# Patient Record
Sex: Female | Born: 1975 | Race: White | Hispanic: No | Marital: Married | Smoking: Current every day smoker
Health system: Southern US, Community
[De-identification: ages and names within clinical notes are randomized; demographics above are authoritative.]

## PROBLEM LIST (undated history)

## (undated) DIAGNOSIS — G709 Myoneural disorder, unspecified: Secondary | ICD-10-CM

## (undated) DIAGNOSIS — D649 Anemia, unspecified: Secondary | ICD-10-CM

## (undated) DIAGNOSIS — M1990 Unspecified osteoarthritis, unspecified site: Secondary | ICD-10-CM

## (undated) DIAGNOSIS — R059 Cough, unspecified: Secondary | ICD-10-CM

## (undated) DIAGNOSIS — G2581 Restless legs syndrome: Secondary | ICD-10-CM

## (undated) DIAGNOSIS — R05 Cough: Secondary | ICD-10-CM

## (undated) DIAGNOSIS — K219 Gastro-esophageal reflux disease without esophagitis: Secondary | ICD-10-CM

## (undated) DIAGNOSIS — R7303 Prediabetes: Secondary | ICD-10-CM

## (undated) DIAGNOSIS — T4145XA Adverse effect of unspecified anesthetic, initial encounter: Secondary | ICD-10-CM

## (undated) DIAGNOSIS — J45909 Unspecified asthma, uncomplicated: Secondary | ICD-10-CM

## (undated) DIAGNOSIS — J40 Bronchitis, not specified as acute or chronic: Secondary | ICD-10-CM

## (undated) DIAGNOSIS — G43909 Migraine, unspecified, not intractable, without status migrainosus: Secondary | ICD-10-CM

## (undated) DIAGNOSIS — F419 Anxiety disorder, unspecified: Secondary | ICD-10-CM

## (undated) DIAGNOSIS — T753XXA Motion sickness, initial encounter: Secondary | ICD-10-CM

## (undated) HISTORY — PX: MYELOGRAM: SHX5347

## (undated) HISTORY — DX: Migraine, unspecified, not intractable, without status migrainosus: G43.909

## (undated) HISTORY — DX: Gastro-esophageal reflux disease without esophagitis: K21.9

## (undated) HISTORY — PX: CARPAL TUNNEL RELEASE: SHX101

## (undated) HISTORY — DX: Unspecified asthma, uncomplicated: J45.909

## (undated) HISTORY — PX: ABDOMINAL HYSTERECTOMY: SHX81

## (undated) HISTORY — PX: TONSILLECTOMY: SUR1361

## (undated) HISTORY — PX: ULNAR NERVE TRANSPOSITION: SHX2595

---
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
## 2004-05-24 ENCOUNTER — Ambulatory Visit: Payer: Self-pay

## 2004-08-09 ENCOUNTER — Emergency Department: Payer: Self-pay | Admitting: Emergency Medicine

## 2004-09-28 ENCOUNTER — Ambulatory Visit: Payer: Self-pay | Admitting: General Surgery

## 2004-10-03 ENCOUNTER — Ambulatory Visit: Payer: Self-pay | Admitting: General Surgery

## 2004-10-13 ENCOUNTER — Ambulatory Visit: Payer: Self-pay | Admitting: General Surgery

## 2004-10-17 ENCOUNTER — Ambulatory Visit: Payer: Self-pay | Admitting: General Surgery

## 2006-05-13 ENCOUNTER — Ambulatory Visit: Payer: Self-pay | Admitting: Internal Medicine

## 2006-05-15 ENCOUNTER — Ambulatory Visit: Payer: Self-pay | Admitting: Internal Medicine

## 2006-05-27 ENCOUNTER — Encounter (INDEPENDENT_AMBULATORY_CARE_PROVIDER_SITE_OTHER): Payer: Self-pay | Admitting: *Deleted

## 2006-05-27 ENCOUNTER — Ambulatory Visit: Payer: Self-pay | Admitting: Internal Medicine

## 2006-06-18 ENCOUNTER — Ambulatory Visit: Payer: Self-pay

## 2006-07-02 ENCOUNTER — Ambulatory Visit: Payer: Self-pay | Admitting: Internal Medicine

## 2006-07-04 ENCOUNTER — Encounter (INDEPENDENT_AMBULATORY_CARE_PROVIDER_SITE_OTHER): Payer: Self-pay | Admitting: Specialist

## 2006-07-04 ENCOUNTER — Ambulatory Visit: Payer: Self-pay | Admitting: Internal Medicine

## 2006-07-10 ENCOUNTER — Inpatient Hospital Stay (HOSPITAL_COMMUNITY): Admission: RE | Admit: 2006-07-10 | Discharge: 2006-07-22 | Payer: Self-pay | Admitting: Neurosurgery

## 2006-07-10 HISTORY — PX: BACK SURGERY: SHX140

## 2006-07-29 ENCOUNTER — Encounter: Payer: Self-pay | Admitting: Vascular Surgery

## 2006-07-29 ENCOUNTER — Ambulatory Visit (HOSPITAL_COMMUNITY): Admission: RE | Admit: 2006-07-29 | Discharge: 2006-07-29 | Payer: Self-pay | Admitting: Neurosurgery

## 2006-08-16 ENCOUNTER — Ambulatory Visit: Payer: Self-pay | Admitting: Internal Medicine

## 2007-01-03 ENCOUNTER — Ambulatory Visit (HOSPITAL_COMMUNITY): Admission: RE | Admit: 2007-01-03 | Discharge: 2007-01-03 | Payer: Self-pay | Admitting: Neurosurgery

## 2007-02-28 ENCOUNTER — Ambulatory Visit: Payer: Self-pay | Admitting: Obstetrics and Gynecology

## 2007-08-18 ENCOUNTER — Inpatient Hospital Stay: Payer: Self-pay | Admitting: Internal Medicine

## 2007-09-30 ENCOUNTER — Inpatient Hospital Stay: Payer: Self-pay | Admitting: Internal Medicine

## 2008-02-05 ENCOUNTER — Ambulatory Visit (HOSPITAL_COMMUNITY): Admission: RE | Admit: 2008-02-05 | Discharge: 2008-02-05 | Payer: Self-pay | Admitting: Neurosurgery

## 2008-02-11 ENCOUNTER — Ambulatory Visit (HOSPITAL_COMMUNITY): Admission: RE | Admit: 2008-02-11 | Discharge: 2008-02-11 | Payer: Self-pay | Admitting: Neurosurgery

## 2008-06-07 ENCOUNTER — Ambulatory Visit (HOSPITAL_COMMUNITY): Admission: RE | Admit: 2008-06-07 | Discharge: 2008-06-07 | Payer: Self-pay | Admitting: Neurosurgery

## 2008-07-11 ENCOUNTER — Emergency Department: Payer: Self-pay | Admitting: Emergency Medicine

## 2008-07-21 ENCOUNTER — Emergency Department: Payer: Self-pay

## 2008-07-30 HISTORY — PX: BACK SURGERY: SHX140

## 2008-09-29 ENCOUNTER — Emergency Department: Payer: Self-pay | Admitting: Unknown Physician Specialty

## 2008-11-16 ENCOUNTER — Emergency Department: Payer: Self-pay | Admitting: Emergency Medicine

## 2008-12-02 ENCOUNTER — Ambulatory Visit (HOSPITAL_COMMUNITY): Admission: RE | Admit: 2008-12-02 | Discharge: 2008-12-03 | Payer: Self-pay | Admitting: Neurosurgery

## 2008-12-29 ENCOUNTER — Ambulatory Visit (HOSPITAL_COMMUNITY): Admission: RE | Admit: 2008-12-29 | Discharge: 2008-12-31 | Payer: Self-pay | Admitting: Neurosurgery

## 2009-04-06 ENCOUNTER — Ambulatory Visit (HOSPITAL_COMMUNITY): Admission: RE | Admit: 2009-04-06 | Discharge: 2009-04-07 | Payer: Self-pay | Admitting: Neurosurgery

## 2009-05-25 ENCOUNTER — Encounter: Admission: RE | Admit: 2009-05-25 | Discharge: 2009-06-29 | Payer: Self-pay | Admitting: Neurosurgery

## 2009-08-01 ENCOUNTER — Telehealth: Payer: Self-pay | Admitting: Internal Medicine

## 2009-08-02 DIAGNOSIS — M1990 Unspecified osteoarthritis, unspecified site: Secondary | ICD-10-CM | POA: Insufficient documentation

## 2009-08-02 DIAGNOSIS — R519 Headache, unspecified: Secondary | ICD-10-CM | POA: Insufficient documentation

## 2009-08-02 DIAGNOSIS — F410 Panic disorder [episodic paroxysmal anxiety] without agoraphobia: Secondary | ICD-10-CM

## 2009-08-02 DIAGNOSIS — Z8719 Personal history of other diseases of the digestive system: Secondary | ICD-10-CM

## 2009-08-02 DIAGNOSIS — K449 Diaphragmatic hernia without obstruction or gangrene: Secondary | ICD-10-CM

## 2009-08-02 DIAGNOSIS — R51 Headache: Secondary | ICD-10-CM

## 2009-08-02 DIAGNOSIS — F411 Generalized anxiety disorder: Secondary | ICD-10-CM | POA: Insufficient documentation

## 2009-08-02 DIAGNOSIS — K219 Gastro-esophageal reflux disease without esophagitis: Secondary | ICD-10-CM | POA: Insufficient documentation

## 2009-08-03 ENCOUNTER — Telehealth: Payer: Self-pay | Admitting: Internal Medicine

## 2009-08-04 ENCOUNTER — Emergency Department: Payer: Self-pay | Admitting: Internal Medicine

## 2009-08-12 ENCOUNTER — Ambulatory Visit: Payer: Self-pay | Admitting: Unknown Physician Specialty

## 2009-08-22 ENCOUNTER — Telehealth: Payer: Self-pay | Admitting: Internal Medicine

## 2009-09-05 ENCOUNTER — Ambulatory Visit: Payer: Self-pay | Admitting: Internal Medicine

## 2009-09-05 DIAGNOSIS — K589 Irritable bowel syndrome without diarrhea: Secondary | ICD-10-CM

## 2009-09-06 ENCOUNTER — Ambulatory Visit: Payer: Self-pay | Admitting: Internal Medicine

## 2009-09-08 ENCOUNTER — Encounter: Payer: Self-pay | Admitting: Internal Medicine

## 2009-09-15 ENCOUNTER — Ambulatory Visit (HOSPITAL_COMMUNITY): Admission: RE | Admit: 2009-09-15 | Discharge: 2009-09-15 | Payer: Self-pay | Admitting: Internal Medicine

## 2009-09-16 ENCOUNTER — Encounter: Payer: Self-pay | Admitting: Internal Medicine

## 2009-09-16 ENCOUNTER — Telehealth: Payer: Self-pay | Admitting: Internal Medicine

## 2009-10-03 ENCOUNTER — Ambulatory Visit: Payer: Self-pay | Admitting: Internal Medicine

## 2009-10-03 LAB — CONVERTED CEMR LAB
ALT: 17 units/L (ref 0–35)
AST: 19 units/L (ref 0–37)
Albumin: 3.9 g/dL (ref 3.5–5.2)
Alkaline Phosphatase: 77 units/L (ref 39–117)
Bilirubin, Direct: 0.1 mg/dL (ref 0.0–0.3)
Ferritin: 62.9 ng/mL (ref 10.0–291.0)
Total Bilirubin: 0.3 mg/dL (ref 0.3–1.2)

## 2010-04-12 ENCOUNTER — Telehealth: Payer: Self-pay | Admitting: Internal Medicine

## 2010-07-10 ENCOUNTER — Ambulatory Visit (HOSPITAL_COMMUNITY)
Admission: RE | Admit: 2010-07-10 | Discharge: 2010-07-10 | Payer: Self-pay | Source: Home / Self Care | Attending: Neurosurgery | Admitting: Neurosurgery

## 2010-08-20 ENCOUNTER — Encounter: Payer: Self-pay | Admitting: Neurosurgery

## 2010-08-21 ENCOUNTER — Encounter: Payer: Self-pay | Admitting: Neurosurgery

## 2010-08-29 NOTE — Procedures (Signed)
Summary: Upper Endoscopy  Patient: [name] Note: All result statuses are Final unless otherwise noted.  Tests: (1) Upper Endoscopy (EGD)   EGD Upper Endoscopy       DONE     [facility]     [address]     [place], [place]  [address]           ENDOSCOPY PROCEDURE REPORT           PATIENT:  [name], [name]  MR#:  [id number]     BIRTHDATE:  1976/06/28, 33 yrs. old  GENDER:  female           ENDOSCOPIST:  Hedwig Morton. Juanda Chance, MD     Referred by:  Loma Sender, M.D.           PROCEDURE DATE:  09/06/2009     PROCEDURE:  EGD with biopsy     ASA CLASS:  Class I     INDICATIONS:  GERD, heartburn long standing GERD, exacerbation     after tonsillectomy refractory to PPI     last EGD 04/2006           MEDICATIONS:   Versed 10 mg, Fentanyl 75 mcg, Benadryl 25 mg     TOPICAL ANESTHETIC:  Exactacain Spray           DESCRIPTION OF PROCEDURE:   After the risks benefits and     alternatives of the procedure were thoroughly explained, informed     consent was obtained.  The [facility] [id number] [id number] endoscope was     introduced through the mouth and advanced to the second portion of     the duodenum, without limitations.  The instrument was slowly     withdrawn as the mucosa was fully examined.     <<PROCEDUREIMAGES>>           Esophagitis was found. mild erythema With standard forceps, a     biopsy was obtained and sent to pathology (see image2).  irregular     Z-line (see image1 and image3).  Otherwise the examination was     normal (see image6, image5, and image4).    Retroflexed views     revealed no abnormalities.    The scope was then withdrawn from     the patient and the procedure completed.           COMPLICATIONS:  None           ENDOSCOPIC IMPRESSION:     1) Esophagitis     2) Irregular Z-line     3) Otherwise normal examination     RECOMMENDATIONS:     1) anti-reflux regimen to be follow     2) await biopsy results     Nexiem 40 mg po bid x 3 days then 40 mg  po qd     consider adding Regllan 10 mg qhs if Sx's don't improve           REPEAT EXAM:  In 0 year(s) for.           ______________________________     Hedwig Morton. Juanda Chance, MD           CC:           n.     eSIGNED:   Hedwig Morton. Afsana Liera at 09/06/2009 04:29 PM           Joni Fears, [id number]  Note: An exclamation mark (!) indicates a result that was not dispersed into the flowsheet.  Document Creation Date: 09/07/2009 10:18 AM _______________________________________________________________________  (1) Order result status: Final Collection or observation date-time: 09/06/2009 16:19 Requested date-time:  Receipt date-time:  Reported date-time:  Referring Physician:   Ordering Physician: Lina Sar [phone]) Specimen Source:  Source: Launa Grill Order Number: [id number] Lab site:

## 2010-08-29 NOTE — Assessment & Plan Note (Signed)
Summary: WORSENING GERD                  DEBORAH   History of Present Illness Visit Type: Follow-up Visit Primary GI MD: Lina Sar MD Primary Provider: Loma Sender, MD  Requesting Provider: n/a Chief Complaint: Worsening GERD  History of Present Illness:   This is a 35 year old white female with chronic gastroesophageal reflux disease  which has  flared up after a tonsillectomy several  weeks ago. She was without her medications for several days and her reflux has become much worse. She increased her Zegerid to bid  but has not noticed significant improvement. Her last upper endoscopy in October 2007 showed grade 2 esophagitis but no evidence of Barrett's esophagus. A colonoscopy in December 2007 was normal. Biopsies for microscopic colitis were negative. She was on Reglan 10 mg at bedtime. Recently, she has had hoarseness and food regurgitation as well as pill dysphagia. Her weight has remained excessive although she has been able to lose 17 pounds since her tonsillectomy. Other medical history includes endometriosis for which she is status post TAH/BSO. She also has had placement of a spinal plate. An upper abdominal ultrasound in October 2007 was negative. She stopped smoking in December 2010 and has eliminated soft drinks. There is a family history of gallbladder disease in her mother, maternal grandmother and her father.   GI Review of Systems    Reports acid reflux and  heartburn.      Denies abdominal pain, belching, bloating, chest pain, dysphagia with liquids, dysphagia with solids, loss of appetite, nausea, vomiting, vomiting blood, weight loss, and  weight gain.        Denies anal fissure, black tarry stools, change in bowel habit, constipation, diarrhea, diverticulosis, fecal incontinence, heme positive stool, hemorrhoids, irritable bowel syndrome, jaundice, light color stool, liver problems, rectal bleeding, and  rectal pain.    Current Medications (verified): 1)  Percocet  10-325 Mg Tabs (Oxycodone-Acetaminophen) .... As Needed For Pain 2)  Flexeril 10 Mg Tabs (Cyclobenzaprine Hcl) .... One Tablet By Mouth Three Times A Day 3)  Prozac 20 Mg Caps (Fluoxetine Hcl) .... One Capsule By Mouth Once Daily 4)  Zegerid Otc 20-1100 Mg Caps (Omeprazole-Sodium Bicarbonate) .... One Tablet By Mouth Two Times A Day  Allergies (verified): 1)  ! Pcn  Past History:  Past Medical History: Reviewed history from 08/02/2009 and no changes required. Current Problems:  HIATAL HERNIA (ICD-553.3) REFLUX ESOPHAGITIS, HX OF (ICD-V12.79) PANIC DISORDER (ICD-300.01) ANXIETY (ICD-300.00) HEADACHE, CHRONIC (ICD-784.0) DEGENERATIVE JOINT DISEASE (ICD-715.90) GERD (ICD-530.81) IBS (ICD-564.1)    Past Surgical History: Hysterectomy Multiple surgeries for endometriosis Back surgery x 2  Tonsillectomy  Family History: Reviewed history from 08/02/2009 and no changes required. No FH of Colon Cancer: Family History of Diabetes: Father, Grandmother, Great grandmother Family History of Heart Disease: Father  Social History: Occupation: Disabled Married Alcohol Use - no Patient is a former smoker: Quit 06-2009 Daily Caffeine Use: occ  Illicit Drug Use - no Smoking Status:  quit  Review of Systems  The patient denies allergy/sinus, anemia, anxiety-new, arthritis/joint pain, back pain, blood in urine, breast changes/lumps, change in vision, confusion, cough, coughing up blood, depression-new, fainting, fatigue, fever, headaches-new, hearing problems, heart murmur, heart rhythm changes, itching, menstrual pain, muscle pains/cramps, night sweats, nosebleeds, pregnancy symptoms, shortness of breath, skin rash, sleeping problems, sore throat, swelling of feet/legs, swollen lymph glands, thirst - excessive , urination - excessive , urination changes/pain, urine leakage, vision changes, and voice  change.         Pertinent positive and negative review of systems were noted in the above  HPI. All other ROS was otherwise negative.   Vital Signs:  Patient profile:   35 year old female Height:      67 inches Weight:      229 pounds BMI:     36.00 BSA:     2.14 Pulse rate:   68 / minute Pulse rhythm:   regular BP sitting:   126 / 80  (left arm) Cuff size:   regular  Vitals Entered By: Ok Anis CMA (September 05, 2009 11:06 AM)  Physical Exam  General:  Well developed, well nourished, no acute distress. Eyes:  PERRLA, no icterus. Mouth:  No deformity or lesions, dentition normal. Neck:  Supple; no masses or thyromegaly. Lungs:  Clear throughout to auscultation. Heart:  Regular rate and rhythm; no murmurs, rubs,  or bruits. Abdomen:  soft abdomen, mildly tender along the left costal margin and left middle quadrant. Bowel sounds are normoactive. There is no distention. There is a post appendectomy scar in the right lower quadrant. Extremities:  No clubbing, cyanosis, edema or deformities noted. Skin:  Intact without significant lesions or rashes. Psych:  Alert and cooperative. Normal mood and affect.   Impression & Recommendations:  Problem # 1:  HIATAL HERNIA (ICD-553.3)  Patient has had an exacerbation of gastroesophageal reflux after tonsillectomy. We will start her on Nexium 40 mg twice a day with samples and we will also send her a prescription. She will continue antireflux measures. We will proceed with an upper endoscopy to rule out Barrett's esophagus.  Orders: EGD (EGD)  Problem # 2:  IBS (ICD-564.1) Patient has IBS predominant diarrhea under reasonable control. Her symptoms have improved since she has lost 17 pounds.  Patient Instructions: 1)  upper endoscopy with biopsies. 2)  Upper abdominal ultrasound. 3)  Nexium 40 mg p.o. b.i.d. for several days then q.d. 4)  Antireflux measures. 5)  Consider restarting Reglan 10 mg at bedtime. 6)  Copy sent to : Dr C. Phillips 7)  The medication list was reviewed and reconciled.  All changed / newly  prescribed medications were explained.  A complete medication list was provided to the patient / caregiver. Prescriptions: NEXIUM 40 MG CPDR (ESOMEPRAZOLE MAGNESIUM) Take 1 tablet by mouth once a day  #30 x 3   Entered by:   Hortense Ramal CMA (AAMA)   Authorized by:   Hart Carwin MD   Signed by:   Hortense Ramal CMA (AAMA) on 09/05/2009   Method used:   Electronically to        [facility] (retail)       [address]       [place], [place]  [address]       Ph: [phone]       Fax: [phone]   RxID:   [id number]   Appended Document: Orders Update    Clinical Lists Changes  Orders: Added new Test order of Ultrasound Abdomen (UAS) - Signed

## 2010-08-29 NOTE — Progress Notes (Signed)
Summary: TRIAGE  Phone Note Outgoing Call   Call placed by: Laureen Ochs LPN,  September 16, 2009 10:56 AM Call placed to: Patient Summary of Call: Ultrasound results reviewed w/pt.  She states the Nexium has improved her symptoms, but not completly. Per Endoscopy report on 09-16-09 if symptoms don't improve add Reglan 10mg  at bedtime. Pt. will continue Nexium and begin Reglan. Pt. to keep scheduled office visit 10-03-09 at 9am. Pt. instructed to call back as needed.  Initial call taken by: Laureen Ochs LPN,  September 16, 2009 10:59 AM    Additional Follow-up for Phone Call Additional follow up Details #2::    I agree with adding Reglan 10 mg at hs Follow-up by: Hart Carwin MD,  September 16, 2009 8:21 PM  New/Updated Medications: METOCLOPRAMIDE HCL 10 MG TABS (METOCLOPRAMIDE HCL) Take 1 every night at bedtime. Prescriptions: METOCLOPRAMIDE HCL 10 MG TABS (METOCLOPRAMIDE HCL) Take 1 every night at bedtime.  #30 x 2   Entered by:   Laureen Ochs LPN   Authorized by:   Hart Carwin MD   Signed by:   Laureen Ochs LPN on 84/13/2440   Method used:   Electronically to        [facility] (retail)       [address]       [place], [place]  [address]       Ph: [phone]       Fax: [phone]   RxID:   [id number]

## 2010-08-29 NOTE — Letter (Signed)
Summary: Appt Reminder 2  [facility]  [address] [place], [place] [address]   Phone: [phone]  Fax: [phone]        September 16, 2009 MRN: [id number]    [facility] [address] [place], [place]  [address]    Dear Ms. Kassin,   You have a return appointment with Dr.Mccoy Testa Juanda Chance on 10-03-09 at 9am. Please remember to bring a complete list of the medicines you are taking, your insurance card and your co-pay.  If you have to cancel or reschedule this appointment, please call before 5:00 pm the evening before to avoid a cancellation fee.  If you have any questions or concerns, please call [phone].    Sincerely,    Laureen Ochs LPN  Appended Document: Appt Reminder 2 Letter mailed to patient.

## 2010-08-29 NOTE — Procedures (Signed)
Summary: EGD   EGD  Procedure date:  05/27/2006  Findings:      Location: [facility]   Patient Name: [name], [name] MRN:  Procedure Procedures: Panendoscopy (EGD) CPT: 43235.    with biopsy(s)/brushing(s). CPT: D1846139.  Personnel: Endoscopist: Jaquala Fuller L. Juanda Chance, MD.  Referred By: Loma Sender, MD.  Exam Location: Exam performed in Outpatient Clinic. Outpatient  Patient Consent: Procedure, Alternatives, Risks and Benefits discussed, consent obtained, from patient. Consent was obtained by the RN.  Indications Symptoms: Abdominal pain, location: epigastric. Reflux symptoms for <1 yr,  History  Current Medications: Patient is not currently taking Coumadin.  Pre-Exam Physical: Performed May 27, 2006  Cardio-pulmonary exam, HEENT exam WNL. Abdominal exam abnormal. Extremity exam, Neurological exam, Mental status exam WNL. Abnormal PE findings include: epig. tenderness.  Comments: Pt. history reviewed/updated, physical exam performed prior to initiation of sedation?yes Exam Exam Info: Maximum depth of insertion Duodenum, intended Duodenum. Vocal cords visualized. Gastric retroflexion performed. Images taken. ASA Classification: I. Tolerance: good.  Sedation Meds: Patient assessed and found to be appropriate for moderate (conscious) sedation. Fentanyl 75 mcg. given IV. Versed 8 mg. given IV. Cetacaine Spray 2 sprays given aerosolized.  Monitoring: BP and pulse monitoring done. Oximetry used. Supplemental O2 given  Findings - ESOPHAGEAL INFLAMMATION: suspected as a result of reflux. Length of inflammation: 1 cm. [place] Classification: Grade B. Biopsy/Esoph Inflamtn taken. ICD9: Esophagitis, Reflux: 530.11. Comments: Grade 2 reflux esophagitis, at least 2 linear erosions, r/o Barrett's  esophagus.  DIAGNOSTIC TEST: from Antrum. RUT done, results pending  Normal: Duodenal Apex. Biopsy/Normal taken. Comments: r/o villous atrophy , pt with diarrhea.     Assessment Abnormal examination, see findings above.  Diagnoses: 530.11: Esophagitis, Reflux.   Comments: reflux esophagitis, s/o biopsies Events  Unplanned Intervention: No unplanned interventions were required.  Unplanned Events: There were no complications. Plans Medication(s): Await pathology. PPI: Pantoprazole/Protonix 40 mg BID, starting May 27, 2006  Promotility: Metaclopramide 10 mg HS, starting May 27, 2006   Patient Education: Patient given standard instructions for: Reflux.  Comments: cosider adding Carafate or antacids such as Gaviscon to her regimen, she needs to loose weight,, stop smoking Disposition: After procedure patient sent to recovery. After recovery patient sent home.   This report was created from the original endoscopy report, which was reviewed and signed by the above listed endoscopist.   FINAL DIAGNOSIS    ***MICROSCOPIC EXAMINATION AND DIAGNOSIS***    1. SMALL BOWEL BIOPSY: BENIGN SMALL BOWEL MUCOSA. NO VILLOUS   ATROPHY, INFLAMMATION OR OTHER ABNORMALITIES PRESENT (biopsies,   duodenum).    2. esophagogastric junction: gastroesophageal junction mucosa   associated with mild acute and chronic inflammation. No   intestinal metaplasia or evidence of malignancy identified   (biopsy).    COMMENT   1. There is small bowel mucosa with normal villous architecture   and no objective increase in inflammation. No villous atrophy,   active inflammation or other significant changes identified.    2. A special stain (PAS-F) is performed to determine the presence   or absence of fungal hyphae in this biopsy. The PAS stain is   negative for fungal hyphae, and the controls stained   appropriately. An Alcian Blue stain is performed to determine the   presence of intestinal metaplasia (goblet cell metaplasia). No   intestinal metaplasia (goblet cell metaplasia) is identified with   the Alcian Blue stain. The control stained appropriately. There    is some staining of some benign mucosal glands but  they do not   have the goblet cell morphology and are not occurring in a   specialized mucosa. There is gastroesophageal junction mucosa   associated with mild to focally moderate acute and chronic   inflammation associated with benign reactive changes of the   epithelium. No atypia or evidence of malignancy is seen. These   findings can be seen in gastroesophageal reflux disease in the   appropriate clinical setting and, therefore, clinical correlation   is recommended. (JAS:jy) 05/28/06    jy   Date Reported: 05/28/2006 Berneta Levins, MD   *** Electronically Signed Out By JAS ***    Clinical information   GERD / abd pain R/O esophagitis (jes)    specimen(s) obtained   1: Small intestine / duodenum, biopsy   2: Esophagogastric junction, biopsy    Gross Description   1. Received in formalin are tan, soft tissue fragments that are   submitted in toto. Number: two.   Size: 0.5 to 0.7 cm One block    2. Received in formalin are tan, soft tissue fragments that are   submitted in toto. Number: two.   Size: 0.3 to 0.4 cm One block (ML:jes, 05/28/06)    jes/

## 2010-08-29 NOTE — Procedures (Signed)
Summary: COLON   Colonoscopy  Procedure date:  07/04/2006  Findings:      Location:  [facility].   Patient Name: Tamara, Welch MRN:  Procedure Procedures: Colonoscopy CPT: [phone].    with biopsy. CPT: Q5068410.  Personnel: Endoscopist: Reisa Coppola L. Juanda Chance, MD.  Exam Location: Exam performed in Outpatient Clinic. Outpatient  Patient Consent: Procedure, Alternatives, Risks and Benefits discussed, consent obtained, from patient. Consent was obtained by the RN.  Indications Symptoms: Diarrhea Patient is having increased frequency of stools. Patient's stools are liquid. Patient is having soft stools.  History  Current Medications: Patient is not currently taking Coumadin.  Pre-Exam Physical: Cardio-pulmonary exam, HEENT exam  WNL. Abdominal exam abnormal. Extremity exam, Neurological exam, Mental status exam WNL. Abnormal PE findings include: epig. tenderness.  Comments: Pt. history reviewed/updated, physical exam performed prior to initiation of sedation?yes Exam Exam: Extent of exam reached: Cecum, extent intended: Cecum.  The cecum was identified by appendiceal orifice and IC valve. Colon retroflexion performed. Images taken. ASA Classification: I. Tolerance: good.  Monitoring: Pulse and BP monitoring, Oximetry used. Supplemental O2 given.  Colon Prep Used Miralax for colon prep. Prep results: good.  Sedation Meds: Patient assessed and found to be appropriate for moderate (conscious) sedation. Fentanyl 75 mcg. given IV. Versed 8 mg. given IV. Benadryl 50 given IV.  Findings - DIAGNOSTIC TEST: Biopsies taken. from Hepatic Flexure to Descending Colon. Reason: r/o microscopic colitis.  - NORMAL EXAM: Cecum.  - NORMAL EXAM: Rectum.    Comments: scope withdrawal time 6:22 min Assessment Normal examination.  Comments: s/p random biopsies Events  Unplanned Interventions: No intervention was required.  Unplanned Events: There were no  complications. Plans Medication Plan: Await pathology. Antispasmodics: Donnatal 1 ac, starting Jul 04, 2006   Comments: pt is schedules to have back surgery nexgt week at [facility] Disposition: After procedure patient sent to recovery. After recovery patient sent home.   This report was created from the original endoscopy report, which was reviewed and signed by the above listed endoscopist.    FINAL DIAGNOSIS    ***MICROSCOPIC EXAMINATION AND DIAGNOSIS***    COLON, RANDOM BIOPSIES: BENIGN COLONIC MUCOSA. NO ACTIVE   INFLAMMATION, MICROSCOPIC COLITIS, GRANULOMAS, OR CHRONIC   CHANGES.    COMMENT   There is colorectal mucosa with normal crypt architecture and no   objective increase in inflammation. No active inflammation,   microscopic colitis, collagenous colitis or significant chronic   changes identified. No hyperplastic or adenomatous changes are   seen, and there is no evidence of malignancy.    gdt   Date Reported: 07/05/2006 Beulah Gandy. Luisa Hart, MD   *** Electronically Signed Out By JDP ***    Clinical information   Diarrhea, early satiety R/O microscopic colitis (jes)    specimen(s) obtained   Colon, biopsy, random    Gross Description   Received in formalin are tan, soft tissue fragments that are   submitted in toto. Number: multiple.   Size: 0.2 to 0.5 cm One block (MA:jes, 07/05/06)

## 2010-08-29 NOTE — Assessment & Plan Note (Signed)
Summary: F/U FROM ENDO. AND ULTRASOUND           [name]   History of Present Illness Visit Type: Follow-up Visit Primary GI MD: Lina Sar MD Primary Provider: Loma Sender, MD  Requesting Provider: n/a Chief Complaint: F/u from endo and ultrasound. Pt states that she still has right side pain  History of Present Illness:   This is a 35 year old white female with severe gastroesophageal reflux documented on prior upper endoscopies; most recently on 09/16/09 which showed grade 2 esophagitis. Biopsies of the Z line showed inflammation. An upper abdominal ultrasound showed fatty liver. There is a positive family history of hemochromatosis in her father and her sister who have both recently had liver biopsies. Patient is asking to be checked for hemochromatosis as well. Patient has been on Nexium 40 mg a day with complete resolution of all her symptoms of reflux. She is post total abdominal hysterectomy and bilateral salpingoophorectomy for endometriosis. Her colonoscopy in October 2007 was normal. She has occasional right lower quadrant abdominal pain and irregular bowel habits.   GI Review of Systems    Reports abdominal pain.     Location of  Abdominal pain: right side.    Denies acid reflux, belching, bloating, chest pain, dysphagia with liquids, dysphagia with solids, heartburn, loss of appetite, nausea, vomiting, vomiting blood, weight loss, and  weight gain.        Denies anal fissure, black tarry stools, change in bowel habit, constipation, diarrhea, diverticulosis, fecal incontinence, heme positive stool, hemorrhoids, irritable bowel syndrome, jaundice, light color stool, liver problems, rectal bleeding, and  rectal pain.    Current Medications (verified): 1)  Percocet 10-325 Mg Tabs (Oxycodone-Acetaminophen) .... As Needed For Pain 2)  Flexeril 10 Mg Tabs (Cyclobenzaprine Hcl) .... One Tablet By Mouth Three Times A Day 3)  Prozac 20 Mg Caps (Fluoxetine Hcl) .... One Capsule By  Mouth Once Daily 4)  Nexium 40 Mg Cpdr (Esomeprazole Magnesium) .... Take 1 Tablet By Mouth Once A Day 5)  Metoclopramide Hcl 10 Mg Tabs (Metoclopramide Hcl) .... Take 1 Every Night At Bedtime.  Allergies (verified): 1)  ! Pcn  Past History:  Past Medical History: Reviewed history from 08/02/2009 and no changes required. Current Problems:  HIATAL HERNIA (ICD-553.3) REFLUX ESOPHAGITIS, HX OF (ICD-V12.79) PANIC DISORDER (ICD-300.01) ANXIETY (ICD-300.00) HEADACHE, CHRONIC (ICD-784.0) DEGENERATIVE JOINT DISEASE (ICD-715.90) GERD (ICD-530.81) IBS (ICD-564.1)    Past Surgical History: Reviewed history from 09/05/2009 and no changes required. Hysterectomy Multiple surgeries for endometriosis Back surgery x 2  Tonsillectomy  Family History: Reviewed history from 08/02/2009 and no changes required. No FH of Colon Cancer: Family History of Diabetes: Father, Grandmother, Great grandmother Family History of Heart Disease: Father  Social History: Reviewed history from 09/05/2009 and no changes required. Occupation: Disabled Married Alcohol Use - no Patient is a former smoker: Quit 06-2009 Daily Caffeine Use: occ  Illicit Drug Use - no  Review of Systems       The patient complains of anxiety-new and back pain.  The patient denies allergy/sinus, anemia, arthritis/joint pain, blood in urine, breast changes/lumps, change in vision, confusion, cough, coughing up blood, depression-new, fainting, fatigue, fever, headaches-new, hearing problems, heart murmur, heart rhythm changes, itching, menstrual pain, muscle pains/cramps, night sweats, nosebleeds, pregnancy symptoms, shortness of breath, skin rash, sleeping problems, sore throat, swelling of feet/legs, swollen lymph glands, thirst - excessive, urination - excessive, urination changes/pain, urine leakage, vision changes, and voice change.         Pertinent positive  and negative review of systems were noted in the above HPI. All other  ROS was otherwise negative.   Vital Signs:  Patient profile:   35 year old female Height:      67 inches Weight:      231 pounds BMI:     36.31 BSA:     2.15 Pulse rate:   62 / minute Pulse rhythm:   regular BP sitting:   128 / 76  (left arm) Cuff size:   regular  Vitals Entered By: Ok Anis CMA (October 03, 2009 9:19 AM)  Physical Exam  General:  Well developed, well nourished, no acute distress. Mouth:  No deformity or lesions, dentition normal. Neck:  Supple; no masses or thyromegaly. Lungs:  Clear throughout to auscultation. Heart:  Regular rate and rhythm; no murmurs, rubs,  or bruits. Abdomen:  obese abdomen, soft with tenderness along the right costal margin and when pounding over the liver. Splenic tip palpable, lower abdomen unremarkable. Extremities:  No clubbing, cyanosis, edema or deformities noted. Skin:  no palmar erythema or spider nevi. Psych:  Alert and cooperative. Normal mood and affect.   Impression & Recommendations:  Problem # 1:  Family Hx of OTHER HEMOCHROMATOSIS (ICD-275.03) There is a history of hemochromatosis in  patient's father and now in one of her sisters. We will do the genetic testing today to confirm whether she has it or not. Liver function tests, iron studies and ferritin will be drawn today as well. Orders: TLB-Hepatic/Liver Function Pnl (80076-HEPATIC) TLB-Ferritin (82728-FER) TLB-IBC Pnl (Iron/FE;Transferrin) (83550-IBC) T-Hemochromatosis [phone])  Problem # 2:  IRRITABLE BOWEL SYNDROME (ICD-564.1) Patient has mild right lower quadrant abdominal pain likely due to to IBS. Her last colonoscopy in 2007 was normal. Orders: TLB-Hepatic/Liver Function Pnl (80076-HEPATIC) TLB-Ferritin (82728-FER) TLB-IBC Pnl (Iron/FE;Transferrin) (83550-IBC) T-Hemochromatosis [phone])  Problem # 3:  REFLUX ESOPHAGITIS, HX OF (ICD-V12.79) currently under good control with Nexium 40 mg daily. We will give her a 90 day  supply.  Patient Instructions: 1)  antireflux measures. 2)  Weight loss. 3)  Nexium 40 mg daily. 4)  Iron studies, genetic HLA  typing for hemochromatosis , ferritin. 5)  Recall colonoscopy October 2017. 6)  The medication list was reviewed and reconciled.  All changed / newly prescribed medications were explained.  A complete medication list was provided to the patient / caregiver. Prescriptions: NEXIUM 40 MG CPDR (ESOMEPRAZOLE MAGNESIUM) Take 1 tablet by mouth once a day  #90 x 3   Entered by:   Hortense Ramal CMA (AAMA)   Authorized by:   Hart Carwin MD   Signed by:   Hortense Ramal CMA (AAMA) on 10/03/2009   Method used:   Electronically to        [facility] (mail-order)             ,          Ph: [phone]       Fax: [phone]   RxID:   [id number]

## 2010-08-29 NOTE — Progress Notes (Signed)
Summary: refill  Phone Note Call from Patient Call back at Home Phone [phone]   Caller: Patient Call For: Dr Juanda Chance Reason for Call: Refill Medication, Talk to Nurse Summary of Call: Patient needs refills for her Nexium called in to [facility] [phone]. Initial call taken by: Tawni Levy,  April 12, 2010 4:25 PM  Follow-up for Phone Call        I have left a message for the patient to call back.  Is she still using [facility]? Dottie Nelson-Smith CMA Duncan Dull)  April 12, 2010 4:36 PM   Additional Follow-up for Phone Call Additional follow up Details #1::        Patient no longer using [facility]. I will d/c prescription at [facility] and send refills to [facility]. Additional Follow-up by: Lamona Curl CMA Duncan Dull),  April 12, 2010 4:41 PM    New/Updated Medications: NEXIUM 40 MG CPDR (ESOMEPRAZOLE MAGNESIUM) Take 1 tablet by mouth once a day Prescriptions: NEXIUM 40 MG CPDR (ESOMEPRAZOLE MAGNESIUM) Take 1 tablet by mouth once a day  #30 x 5   Entered by:   Lamona Curl CMA (AAMA)   Authorized by:   Hart Carwin MD   Signed by:   Lamona Curl CMA (AAMA) on 04/12/2010   Method used:   Electronically to        [facility] (retail)       [address]       [place], [place]  [address]       Ph: [phone]       Fax: [phone]   RxID:   [id number]

## 2010-08-29 NOTE — Progress Notes (Signed)
Summary: Triage  Phone Note Call from Patient Call back at Home Phone [phone]   Caller: Patient Call For: Dr. Juanda Chance Reason for Call: Talk to Nurse Summary of Call: Pt. had tonsil's taking out 10 days ago and cannot swallow anything except liquids. Wants to know what she can take for heartburn Initial call taken by: Karna Christmas,  August 22, 2009 3:57 PM  Follow-up for Phone Call        Pt.  is unable to swallow her heartburn meds, it hurts too bad. She uses Zegerid OTC. Has been using Mylanta, but not helping. Also states she is loosing weight because she can't eat since the surgery, her throat hurts too bad.  1) Contact your surgeon ASAP about c/o 2) Open Zegerid capsule and put medicine in applesauce,yogurt,etc until you can swallow whole capsule again. 3) May supplement w/Tums,Rolaids as needed 4) Supplement your diet with Boost,Ensure or Carnation Instant Breakfast 5) Pt. instructed to call back as needed.   Follow-up by: Laureen Ochs LPN,  August 22, 2009 4:07 PM  Additional Follow-up for Phone Call Additional follow up Details #1::        reviewed and agree. Additional Follow-up by: Hart Carwin MD,  August 22, 2009 5:40 PM

## 2010-08-29 NOTE — Miscellaneous (Signed)
Summary: nexium rx  Clinical Lists Changes  Medications: Added new medication of NEXIUM 40 MG  CPDR (ESOMEPRAZOLE MAGNESIUM) 1 capsule each day 30 minutes before meal  two times a day x 3 days then nexium 40 mg by mouth daily - Signed Rx of NEXIUM 40 MG  CPDR (ESOMEPRAZOLE MAGNESIUM) 1 capsule each day 30 minutes before meal  two times a day x 3 days then nexium 40 mg by mouth daily;  #33 x 0;  Signed;  Entered by: Weston Brass;  Authorized by: Hart Carwin MD;  Method used: Electronically to [facility] [address], [place], [place], [place]  [address], Ph: [phone], Fax: [phone] Observations: Added new observation of ALLERGY REV: Done (09/06/2009 17:05)    Prescriptions: NEXIUM 40 MG  CPDR (ESOMEPRAZOLE MAGNESIUM) 1 capsule each day 30 minutes before meal  two times a day x 3 days then nexium 40 mg by mouth daily  #33 x 0   Entered by:   Weston Brass   Authorized by:   Hart Carwin MD   Signed by:   Weston Brass on 09/06/2009   Method used:   Electronically to        [facility] (retail)       [address]       [place], [place]  [address]       Ph: [phone]       Fax: [phone]   RxID:   [id number]

## 2010-08-29 NOTE — Letter (Signed)
Summary: EGD Instructions  [facility]  [address] [place], [place] [address]   Phone: [phone]  Fax: [phone]       [name]    1976/03/06    MRN: [id number]       Procedure Day/Date: 09/06/09 (Tuesday)     Arrival Time: 2:30 pm     Procedure Time: 3:30 pm     Location of Procedure:                    _ x _ [facility] (4th Floor)  PREPARATION FOR ENDOSCOPY   On 2/8/11THE DAY OF THE PROCEDURE:  1.   No solid foods, milk or milk products are allowed after midnight the night before your procedure.  2.   Do not drink anything colored red or purple.  Avoid juices with pulp.  No orange juice.  3.  You may drink clear liquids until 1:30 pm which is 2 hours before your procedure.                                                                                                CLEAR LIQUIDS INCLUDE: Water Jello Ice Popsicles Tea (sugar ok, no milk/cream) Powdered fruit flavored drinks Coffee (sugar ok, no milk/cream) Gatorade Juice: apple, white grape, white cranberry  Lemonade Clear bullion, consomm, broth Carbonated beverages (any kind) Strained chicken noodle soup Hard Candy   MEDICATION INSTRUCTIONS  Unless otherwise instructed, you should take regular prescription medications with a small sip of water as early as possible the morning of your procedure.  Diabetic patients - see separate instructions.                 OTHER INSTRUCTIONS  You will need a responsible adult at least 37 years of age to accompany you and drive you home.   This person must remain in the waiting room during your procedure.  Wear loose fitting clothing that is easily removed.  Leave jewelry and other valuables at home.  However, you may wish to bring a book to read or an iPod/MP3 player to listen to music as you wait for your procedure to start.  Remove all body piercing jewelry and leave at home.  Total time from sign-in until discharge  is approximately 2-3 hours.  You should go home directly after your procedure and rest.  You can resume normal activities the day after your procedure.  The day of your procedure you should not:   Drive   Make legal decisions   Operate machinery   Drink alcohol   Return to work  You will receive specific instructions about eating, activities and medications before you leave.    The above instructions have been reviewed and explained to me by   Hortense Ramal, CMA   I fully understand and can verbalize these instructions _____________________________ Date 09/05/09

## 2010-08-29 NOTE — Progress Notes (Signed)
Summary: TRIAGE  Phone Note Call from Patient Call back at [phone]   Caller: Patient Call For: Dr.Garvey Westcott Summary of Call: patient is having a increase problem with heartburn and gerd, she is taking otc Zegerid 2-3 times a day.  Initial call taken by: Harlow Mares CMA (AAMA),  August 01, 2009 11:34 AM  Follow-up for Phone Call        Pt. c/o increased GERD x 1-2 monthes, getting worse. Takes Zegerid OTC 2-3 times daily and TUMS as needed. Drank on New Years, caused pain to worsen and pt. vomited.  Denies blood, black stools, fever.  Pt. will see Dr.Hilberto Burzynski on 08-03-09 at 9am 2) Zegerid two times a day and TUMS as needed 3) Soft,bland diet. No spicy,greasy,fried foods.  4) If symptoms become worse call back immediately or go to ER.  Follow-up by: Laureen Ochs LPN,  August 01, 2009 12:14 PM  Additional Follow-up for Phone Call Additional follow up Details #1::        reviewed and agree Additional Follow-up by: Hart Carwin MD,  August 01, 2009 1:44 PM

## 2010-08-29 NOTE — Letter (Signed)
Summary: Patient [facility] Biopsy Results  [facility]  [address] [place], [place] [address]   Phone: [phone]  Fax: [phone]        September 08, 2009 MRN: [id number]    [facility] [address] [place], [place]  [address]    Dear [name],  I am pleased to inform you that the biopsies taken during your recent endoscopic examination did not show any evidence of cancer upon pathologic examination.The biopsies from Your esophagus show mild inflammation due to reflux  Additional information/recommendations:  __No further action is needed at this time.  Please follow-up with      your primary care physician for your other healthcare needs.  __ Please call [phone] to schedule a return visit to review      your condition.  _x_ Continue with the treatment plan as outlined on the day of your      exam.  _   Please call us if you are having persistent problems or have questions about your condition that have not been fully answered at this time.  Sincerely,  Hart Carwin MD  This letter has been electronically signed by your physician.  Appended Document: Patient Notice-Endo Biopsy Results letter mailed 2.11.11

## 2010-08-29 NOTE — Progress Notes (Signed)
Summary: Triage  Phone Note Call from Patient Call back at Home Phone [phone]   Caller: Spouse Call For: Dr. Juanda Chance Summary of Call: Pt.'s husband called and said she is sick. Canceled today's appt. and wants to know if she can get worked in next week. I told him next available is in Feb. and he insisted on next week. Initial call taken by: Karna Christmas,  August 03, 2009 8:06 AM  Follow-up for Phone Call        Message left for patient to callback.Laureen Ochs LPN  August 03, 2009 10:24 AM  Pt. states she has a sinus infection, fever and sore throat, she is going to see her PCP today. She rescueduled her appt. with Dr.Jermaine Neuharth to 09-05-09 at 11:15am, she declined a sooner appt. with the PA/NP. Pt. instructed to call back as needed.  Follow-up by: Laureen Ochs LPN,  August 03, 2009 11:39 AM

## 2010-11-03 LAB — URINALYSIS, ROUTINE W REFLEX MICROSCOPIC
Ketones, ur: NEGATIVE mg/dL
Leukocytes, UA: NEGATIVE
Nitrite: NEGATIVE
Protein, ur: NEGATIVE mg/dL
Specific Gravity, Urine: 1.029 (ref 1.005–1.030)

## 2010-11-03 LAB — CBC
HCT: 39.2 % (ref 36.0–46.0)
MCHC: 33.6 g/dL (ref 30.0–36.0)
MCV: 88.7 fL (ref 78.0–100.0)
Platelets: 382 10*3/uL (ref 150–400)
RBC: 4.42 MIL/uL (ref 3.87–5.11)
RDW: 14.7 % (ref 11.5–15.5)
WBC: 10.8 10*3/uL — ABNORMAL HIGH (ref 4.0–10.5)

## 2010-11-03 LAB — URINE MICROSCOPIC-ADD ON

## 2010-11-07 LAB — URINALYSIS, ROUTINE W REFLEX MICROSCOPIC
Bilirubin Urine: NEGATIVE
Glucose, UA: NEGATIVE mg/dL
Ketones, ur: NEGATIVE mg/dL
Leukocytes, UA: NEGATIVE
Nitrite: NEGATIVE
Specific Gravity, Urine: 1.019 (ref 1.005–1.030)
Urobilinogen, UA: 0.2 mg/dL (ref 0.0–1.0)
pH: 6 (ref 5.0–8.0)
pH: 6 (ref 5.0–8.0)

## 2010-11-07 LAB — CBC
HCT: 37.9 % (ref 36.0–46.0)
MCHC: 33.9 g/dL (ref 30.0–36.0)
MCHC: 33.9 g/dL (ref 30.0–36.0)
MCV: 88.4 fL (ref 78.0–100.0)
MCV: 88.8 fL (ref 78.0–100.0)
Platelets: 370 10*3/uL (ref 150–400)
RDW: 13.8 % (ref 11.5–15.5)
RDW: 14.1 % (ref 11.5–15.5)
WBC: 12.7 10*3/uL — ABNORMAL HIGH (ref 4.0–10.5)

## 2010-11-07 LAB — URINE MICROSCOPIC-ADD ON

## 2010-12-12 NOTE — Op Note (Signed)
NAMELYNEL, FORESTER               ACCOUNT NO.:  [id number]   MEDICAL RECORD NO.:  [id number]          PATIENT TYPE:  OIB   LOCATION:  3538                         FACILITY:  [facility]   PHYSICIAN:  Coletta Memos, M.D.     DATE OF BIRTH:  Feb 24, 1976   DATE OF PROCEDURE:  12/02/2008  DATE OF DISCHARGE:                               OPERATIVE REPORT   PREOPERATIVE DIAGNOSIS:  Chronic back and right lower extremity pain.   POSTOPERATIVE DIAGNOSIS:  Chronic back and right lower extremity pain.   PROCEDURE:  Spinal cord stimulator trial procedure.   SURGEON:  Coletta Memos, MD   INDICATIONS:  [name] is a long-term patient of mine who has  undergone a lumbar fusion.  She has chronic pain and has decided to try  a spinal cord stimulator.  Today, she is here for stage I stimulator  placement.   The patient was prepped and draped in a sterile fashion.  Rolled prone  onto body rolls.  Fluoroscopy brought into position and using a Tuohy  needle, I was able to advance the spinal cord stimulator trial lead  percutaneously to approximately the T8 level.  There was one punch that  I made at the level below this where I certainly believed that there was  no spinal fluid.  I immediately withdrew the needle and moved up on  space.  [name] tolerated the procedure well, moving all extremities.  Sterile dressing was applied.           ______________________________  Coletta Memos, M.D.     KC/MEDQ  D:  12/02/2008  T:  12/03/2008  Job:  [id number]

## 2010-12-12 NOTE — Discharge Summary (Signed)
[name], [name]               ACCOUNT NO.:  [id number]   MEDICAL RECORD NO.:  [id number]          PATIENT TYPE:  OIB   LOCATION:  3538                         FACILITY:  [facility]   PHYSICIAN:  Coletta Memos, M.D.     DATE OF BIRTH:  [birth year]   DATE OF ADMISSION:  12/02/2008  DATE OF DISCHARGE:  12/03/2008                               DISCHARGE SUMMARY   ADMITTING DIAGNOSIS:  Chronic lower extremity pain.   POSTOPERATIVE DIAGNOSIS:  Chronic lower extremity pain.   PROCEDURE:  Spinal cord stimulator trial.   COMPLICATIONS:  None.   DISCHARGE STATUS:  Alive and well.   INDICATIONS:  [name] is a long-term patient of mine who has chronic  low back and right lower extremity pain.  She decided that she wanted to  try spinal cord stimulator.  She was admitted for the stage I of the  trial.  I placed percutaneously spinal cord stimulator to the T8-9  level.  She was able to achieve stimulation much better over the lower  extremity than in the back.  She will be discharged home today.  Wounds  clean and dry.  No signs of infection.  She was given Dilaudid 2 mg p.o.  q.6 h. for pain.  I will see her back next week for spinal cord  stimulator trial removal.           ______________________________  Coletta Memos, M.D.     KC/MEDQ  D:  12/03/2008  T:  12/04/2008  Job:  [id number]

## 2010-12-12 NOTE — Op Note (Signed)
NAMEMATILYN, FEHRMAN               ACCOUNT NO.:  [id number]   MEDICAL RECORD NO.:  [id number]          PATIENT TYPE:  OIB   LOCATION:  3533                         FACILITY:  [facility]   PHYSICIAN:  Coletta Memos, M.D.     DATE OF BIRTH:  Sep 22, 1975   DATE OF PROCEDURE:  12/29/2008  DATE OF DISCHARGE:                               OPERATIVE REPORT   This is second stage of spinal cord stimulator placement.   COMPLICATIONS:  None.   SURGEON:  Coletta Memos, MD   ANESTHESIA:  General endotracheal.   PROCEDURES:  1. Thoracic laminectomy, T10 for a spinal cord stimulator placement.  2. Lower quadrant abdominal placement of spinal cord stimulator      battery and control unit.   INDICATIONS:  [name] is a patient of mine whom I am taking care  of now for approximately 4 years.  She has chronic pain.  We did a trial  spinal cord stimulator few weeks ago, which she said gave her relief  that she wanted to pursue on a permanent basis.  She is admitted today  for permanent placement.   OPERATIVE NOTE:  [name] also stated that she wanted the battery unit  placed anteriorly in the abdomen, so we did a procedure in 2 separate  parts, first prone and then supine.   OPERATIVE NOTE:  [name] was brought to the operating room intubated,  placed under general anesthetic without difficulty.  She was rolled  prone onto [name] frame and all pressure points were properly padded.  Using fluoroscopy, I was able to localize T10 area.  She was then  prepped and draped in a sterile fashion.  I opened small incision over  the thoracic spine.  I again used C-arm to confirm my location.  I then  performed a small hemilaminectomy of what I believes to be is T11.  I  then was able to place the spinal cord stimulator at T4, T5, T6 paddle  [employer] device extending to bottom of T7 and T8, and T9.  I then  reapproximated the fascia, anchored the leads to the fascia in the super  fascial space.  I then  tunneled that to her right flank, created a  pocket and then closed that temporarily prior to turning.  At that time,  I fully closed the thoracic incision and placed Dermabond over.  I also  closed the flank incision using Vicryl sutures.  After the Dermabond was  dry, we then positioned her supine on the operating room table.  The  flank incision was accessible from a purely supine approach.  Therefore,  she was prepped and draped again in a sterile fashion.  I infiltrated 10  mL, added some lidocaine 1:200,000 of epinephrine in the right lower  quadrant.  I opened that incision along with the flank incision.  I then  tunneled from the abdominal pocket to the flank incision.  I then  threaded the leads after securing into an extension through the  tunneller.  I secured that to the battery control unit.  I then placed  that  in the pocket, after it was interrogated and found to be  working properly.  I then closed both incisions, the flank incision with  Vicryl sutures and the abdominal incision and layers with Vicryl  sutures.  I used Dermabond as the sterile dressing on both.  [name] tolerated the procedure well.           ______________________________  Coletta Memos, M.D.     KC/MEDQ  D:  12/29/2008  T:  12/30/2008  Job:  [id number]

## 2010-12-15 NOTE — Assessment & Plan Note (Signed)
[facility]                         GASTROENTEROLOGY OFFICE NOTE   NAME:[name]                      MRN:          [id number]  DATE:08/16/2006                            DOB:          [birth year]    [name] is a 35 year old white female with irritable bowel  syndrome/diarrhea and gastroesophageal reflux disease who has been  followed since October of 2007 after undergoing diagnostic tests,  including upper endoscopy and colonoscopy.  We have been able to rule  out inflammatory bowel disease from biopsies and rule in reflux  esophagitis.  She has been quite well-controlled on Protonix 40 mg a  day, but her co-pay is quite high and she is interested in a cheaper  medication for control of her gastroesophageal reflux.  Unfortunately,  she has had a set back in her irritable bowel syndrome after lumbar  spondylosis and degenerative disk disease  L4-5 surgery by Dr. Franky Macho  who did arthrodesis at L4-5 and placed a spinal plate at W0-9.  She had  a spinal leak postoperatively.  She is now complaining of left foot pain  and burning.  She came on crutches and complained steadily of various  pains related to her back and leg.   MEDICATIONS:  1. Chantix.  2. Protonix 40 mg p.o. daily.  3. Advair.  4. Spiriva.  5. Vicodin 5/500 p.r.n. abdominal pain.  6. Lyrica 75 mg, three a day.  7. Ultram 50 mg p.r.n. leg pain.  8. Elavil 25 mg q.h.s.   PHYSICAL EXAMINATION:  VITAL SIGNS:  Blood pressure 112/74, pulse 82.  Weight is 210 pounds which represents a 4 pound weight loss since last  time.  GENERAL:  The patient was somewhat agitated, complaining constantly.  LUNGS:  Clear to auscultation.  CARDIAC:  Normal S1 and S2.  Rapid pulse.  ABDOMEN:  Soft.  There were normoactive bowel sounds.  Tenderness in the  epigastrium.  Lower abdomen was normal.  RECTAL:  Exam was not repeated.  EXTREMITIES:  She had wrapped up her left foot.  I really did not  examine it.  She has an appointment with Dr. Franky Macho.   IMPRESSION:  54. A 35 year old white female with irritable bowel syndrome/diarrhea,  2. Gastroesophageal reflux disease, reasonably well-controlled on      proton pump inhibitors.  The patient needs to switch to a cheaper      medication.   PLAN:  1. Continue Donnatal one p.o. q.i.d. q.a.c. and q.h.s. as an      antispasmodic.  2. High fiber diet  3. Switch to Prilosec 20 mg p.o. b.i.d.  Prescription given for 60      with one-year refill.   FOLLOW UP:  I will see her again on a p.r.n. basis     Dora M. Juanda Chance, MD  Electronically Signed    DMB/MedQ  DD: 08/16/2006  DT: 08/16/2006  Job #: [id number]   cc:   Leane Para, M.D.

## 2010-12-15 NOTE — Discharge Summary (Signed)
NAMEELLEAH, HEMSLEY               ACCOUNT NO.:  [id number]   MEDICAL RECORD NO.:  [id number]          PATIENT TYPE:  INP   LOCATION:  3104                         FACILITY:  [facility]   PHYSICIAN:  Coletta Memos, M.D.     DATE OF BIRTH:  28-Mar-1976   DATE OF ADMISSION:  07/10/2006  DATE OF DISCHARGE:  07/22/2006                               DISCHARGE SUMMARY   ADMITTING DIAGNOSES:  Lumbar spondylosis and degenerative disk disease  L4-5.   DISCHARGE DIAGNOSES:  1. Lumbar stenosis and degenerative disk disease L4-5, L5-S1.   PROCEDURES:  1. Posterior lumbar interbody arthrodesis L4-5, L5-S1 and spinal plate      Z6-1.  2. Exploration of wound.  3. Repair of spinal fluid leak.  4. Placement of lumbar drain.   COMPLICATIONS:  Spinal fluid leak after first operation necessitating re-  exploration of the wound and placement of a lumbar drain.  At discharge,  the patient's wound was clean, dry, without signs of infection.  She was  ambulating, voiding, tolerating a regular diet.   Mrs Berneice Gandy left before receiving a prescription for pain medication.  I  will see her back in the office in approximately 2 weeks for suture  removal and followup.           ______________________________  Coletta Memos, M.D.     KC/MEDQ  D:  07/22/2006  T:  07/22/2006  Job:  [id number]

## 2010-12-15 NOTE — Assessment & Plan Note (Signed)
[facility]                         GASTROENTEROLOGY OFFICE NOTE   NAME:[name], [name]                      MRN:          [id number]  DATE:07/02/2006                            DOB:          [birth year]    Miss [name] is a 35 year old white female with several GI issues which we  have been treating with partial success. As far as her gastroesophageal  reflux is concerned it has responded favorably to Protonix 40 mg p.o.  b.i.d. with Reglan 10 mg at bedtime but her diarrhea continues. She has  diarrhea starting in the mornings, a good day is usually 2 loose bowel  movements a day. A bad day is up to 6 to 8 bowel movements a day.  Despite having frequent liquid and loose stools she has not lost any  weight and remains overweight. She has crampy abdominal pain which is  somewhat improved on Donnatal as an antispasmodic but does not respond  to Imodium. She takes 2 in the morning and 2 at night. Her upper  abdominal ultrasound was normal. Her blood tests were normal except for  sedimentation rate which was slightly elevated at 26. There is no family  history of inflammatory bowel disease. She denies any rectal bleeding.  She had a colonoscopy in April 2006, by Dr. Aliene Altes in [place] but no  biopsies were taken throughout microscopic collagenous colitis. The  patient was told that the exam was normal except for some polyps.   PHYSICAL EXAMINATION:  Blood pressure 102/68, pulse 70, weight 214  pounds. She was talkative, somewhat nervous.  LUNGS: Clear to auscultation.  Cor was normal S1, S2.  ABDOMEN: Was obese, soft and tender in left lower quadrant and left  middle quadrant.  RECTAL EXAM: Not repeated.  EXTREMITIES: No edema.   IMPRESSION:  35 year old white female with continued diarrhea not  responsive to Imodium or antispasmodics. I still think it is most likely  an irritable bowel syndrome but microscopic  and collagenous colitis  have not been  ruled out because of the frequencies of the stools and the  fact that the patient is unable to work at this time. We will proceed  with another colonoscopy and this time obtain biopsies. We would also  like to try an antispasmodic with Phenobarbital which may have a more  calming effect on her GI tract using Donnatal  Extentabs  h.s. She needs  to continue on her Celexa and Vicodin. The patient is also for lumbar  laminectomy in next 2 weeks at [facility] because of herniated disc.     Hedwig Morton. Juanda Chance, MD  Electronically Signed    DMB/MedQ  DD: 07/02/2006  DT: 07/02/2006  Job #: [id number]   cc:   Loma Sender

## 2010-12-15 NOTE — Op Note (Signed)
NAMERENATHA, [name]               ACCOUNT NO.:  [id number]   MEDICAL RECORD NO.:  [id number]          PATIENT TYPE:  INP   LOCATION:  3104                         FACILITY:  [facility]   PHYSICIAN:  Coletta Memos, M.D.     DATE OF BIRTH:  September 28, 1975   DATE OF PROCEDURE:  07/16/2006  DATE OF DISCHARGE:                               OPERATIVE REPORT   PREOPERATIVE DIAGNOSIS:  Spinal fluid leak, status post posterior lumbar  interbody fusion.   POSTOPERATIVE DIAGNOSIS:  Spinal fluid leak, status post posterior  lumbar interbody fusion.   PROCEDURES:  1. Wound exploration with patching of spinal fluid leak using Duragen,      muscle tissue and bovine pericardium along with Tisseel.  2. Replacement of spire plate.   FINDINGS:  Spire plate was found to be loose.  When I opened the case, I  placed that again.   OPERATIVE NOTE:  Mrs. [name] had her operation last week, Wednesday.  She  had a two-level PLIF which involved a spinal fluid leak.  I did repair  it primarily, placed a piece of Duragen over the repair site and also  placed Tisseel.  The patient was kept down for 3 days and did well on  Saturday and Sunday.  She started to leak some fluid on Monday.  She was  kept down on Monday without any relief.  I therefore took her back to  the operating room today for placement of the lumbar drain and  exploration of the wound.   OPERATIVE NOTE:  Mrs. [name] was brought to the operating room, intubated  and placed under general anesthetic without difficulty.  She was rolled  prone onto a [name] frame.  All pressure points properly padded.  I  removed the suture from the skin.  Skin was prepped and draped in  sterile fashion.  I opened the wound again and encountered what was  serosanguineous fluid, consistent with spinal fluid.  I placed self-  retaining retractors and identified the spinal fluid leak.  Again, the  tissue was too thin to try to stitch it again as one stitch had already  been  placed.  I therefore placed another piece of Duragen over that  area.  I took a piece of muscle which I cut from the wound site, placed  that over it and placed some Tisseel.  I then placed a piece of bovine  pericardium and stitched that around the muscle and Duragen.  I place  Duragen on the opposite side of the lamina at that level.  I placed  Tisseel over that.  I then placed a lumbar drain intraoperatively and  brought that out via separate stab incision.   I also found what was a loose spire plate.  So I removed the screw,  retightened it and placed another screw and a spire plate.  X-ray was  taken that showed that the plate was in good position.  I then closed  wound in layered fashion using Vicryl sutures.  I reapproximated the  skin with single interrupted vertical mattress sutures.  I then placed a  sterile dressing on the wound.  I tested the lumbar drain which was  still functional.  That was capped off and she was taken to the recovery  room where I attached the lumbar drain.           ______________________________  Coletta Memos, M.D.    KC/MEDQ  D:  07/16/2006  T:  07/17/2006  Job:  [id number]

## 2010-12-15 NOTE — Assessment & Plan Note (Signed)
[facility]                           GASTROENTEROLOGY OFFICE NOTE   NAME:[name], NOVALYNN                        MRN:          [id number]  DATE:05/13/2006                            DOB:          February 26, 1976    Ms. Berneice Gandy is a 35 year old white female.  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  She is referred by Dr. Vear Clock because of ongoing digestive  problems, dysphagia, early satiety, epigastric pain with eating,  regurgitation as well as diarrhea.  Her diarrhea occurs mostly in the  morning and postprandial within 15 minutes of eating.  Stools are usually  mushy.  There is no blood.  She very rarely has nocturnal bowel movements.  She has seen a gastroenterologist and has been evaluated with upper GI  series, CT scan and a colonoscopy as well as ultrasound.  She has food  intolerance to [place] food in the past and pizza.  She gets very jittery in  the morning and shaky and nauseated.  She grew up in a family of five  children.  No other siblings have similar problems.  Her parents got  divorced.  She has not spoken to her mother for about 10 years.  She is  close to her father.  She currently lives with her fiancee and is unable to  work because of various digestive problems and shakiness.  She has insomnia.  In spite of food hurting her, she has gained about 50 pounds in the last  three years from 150 to about 215 pounds.   MEDICATIONS:  1. Chantix for smoking cessation b.i.d.  This is her third week and she      has been able to cut back from one and a half pack of cigarettes a day      to three cigarettes a day.  2. Protonix 40 mg daily, just started three weeks ago.  3. Celexa 10 mg daily.  4. Advair and Spiriva.   PAST MEDICAL HISTORY:  1. Significant for endometriosis necessitating several arthroscopic      surgeries before age of 74 and finally total abdominal hysterectomy and      bilateral salpingo-oophorectomy four years ago.  She  is having hot      flashes.  2. She has asthmatic bronchitis.  3. Thyroid problems.  4. Anxiety.  5. Panic disorder.  6. Allergies.  7. Chronic headaches.   FAMILY HISTORY:  Heart disease in her father, diabetes in her father.  Also  father had cholecystectomy.   SOCIAL HISTORY:  She is divorced.  No children.  She lives with a fiancee.  Currently smokes only three cigarettes a day and does not drink alcohol.   REVIEW OF SYSTEMS:  Positive for off and on fever, eye glasses, allergies,  urination changes, sleeping problems, joint pains, excessive thirst, night  sweats, nose bleeds, blood in urine, back pain, headaches, excessive  urination, severe fatigue, hearing problems, shortness of breath, leakage of  urine, muscle cramps. She will need some blood test.   PHYSICAL EXAMINATION:  Blood pressure 114/72, pulse 56 and weight 215  pounds.  She was somewhat nervous, spoke very fast and excessively.  Sclerae  nonicteric.  There was no resting tremor.  There was no exophthalmos.  Neck  supple.  Lymphadenopathy thyroid was not enlarged.  Lungs were clear to  auscultation.  There were no rales or wheezes.  Cor with normal S1, normal  S2.  Abdomen was soft, moderately obese but tender mostly in left lower  quadrant and across the suprapubic area but there was also mild tenderness  in subxiphoid area and epigastrium.  Liver edge was in the costal margin.  Umbilicus was rather tender.  There was no umbilical hernia but only  previous scars from five prior laparoscopies.  Rectal exam shows normal  perianal area, normal tone.  Stool was brown and hemoccult negative.   IMPRESSION:  37. A 35 year old white female with dysphagia, epigastric pain and      epigastric discomfort such as gastritis or gastroesophageal reflux      disease possible.  Functional known ulcer dyspepsia.  Rule out low-      grade pancreatitis.  Rule out biliary problems, although she apparently      had normal ultrasound  of the gallbladder in the past but she does have      family history of gallbladder disease in her father.  2. Diarrhea mostly postprandially without obvious weight loss or bleeding.      Stool is heme negative.  I suspect this is an irritable bowel syndrome.      Less likely inflammatory bowel disease.  She had a normal colonoscopy      but I am not sure if biopsies were taken of the mucosa to rule out      microscopic colitis.  3. Anxiety, insomnia, headaches:  The patient may have some psychological      problems which may have to be further evaluated.   PLAN:  1. We will proceed with upper endoscopy and biopsies.  2. Increase Protonix to 40 mg p.o. b.i.d.  3. Ultrasound of the upper abdomen to look at the pancreas and gallbladder      as well.  4. CBC, amylase, lipase today.  The CMET and tissue transglutaminase to      rule out sprue.  5. Robinul forte 2 mg b.i.d.  6. Increase Celexa to 20 mg p.o. daily.  7. I have filled out her disability form for work so she could stay out of      work until we complete this evaluation.       Hedwig Morton. Juanda Chance, MD     DMB/MedQ  DD:  05/13/2006  DT:  05/13/2006  Job #:  [id number]   cc:   Loma Sender

## 2010-12-15 NOTE — Op Note (Signed)
[name], [name]               ACCOUNT NO.:  [id number]   MEDICAL RECORD NO.:  [id number]          PATIENT TYPE:  INP   LOCATION:  2899                         FACILITY:  [facility]   PHYSICIAN:  Coletta Memos, M.D.     DATE OF BIRTH:  12/09/1975   DATE OF PROCEDURE:  07/10/2006  DATE OF DISCHARGE:                               OPERATIVE REPORT   PREOPERATIVE DIAGNOSIS:  Lumbar spondylosis, L4-5.   POSTOPERATIVE DIAGNOSIS:  Lumbar spondylosis L4-5, L5-S1 or last open  disk space and vestigial disk space.   COMPLICATIONS:  Dural opening repaired primarily.   INDICATIONS:  [name] presented with history of low back pain  which she has had for a number of years.  She has a degenerated disk at  L4-5 on preoperative scans and what appeared to be a vestigial disk  below that.  I recommended that she undergo decompression, that she  undergo fusion of the lumbar spine.   PROCEDURE:  Posterior lumbar interbody fusion L4-5, L5-S1 with 11 mm  PEEK cages at L5-S1 and 13 mm PEEK cages at L4-5, Spire plate that spans  the L4-5 space.   FINDINGS:  The L5-S1 space, frankly I think is a vestigial disk space.  There was no gross movement at that level that there was some  spondylitic change and I therefore went ahead and decompressed that  level and placed __________ spondylosis placed 11 mm PEEK cages there,  at L5 the degenerated disk was removed as the disk at L5-S1 was also  degenerated.  13 mm PEEK cages were placed and a Spire plate was placed.   OPERATIVE NOTE:  [name] was taken to the operating room intubated  and placed under general anesthetic without difficulty.  She was rolled  prone onto a Jackson table.  All pressure points were properly padded.  Foley catheter been placed under sterile conditions.  Back was prepped.  She was draped in sterile fashion.  Opened the skin with a #10 blade and  took this down to the thoracolumbar fascia.  I took an intraoperative x-  ray  to localize the L4-5 and L5-S1 spaces.  I then proceeded with  decompression of both the L5-S1 and L4-5 spaces.  What I am calling L5-S1 I frankly believed to be a vestigial disk space.  There was some spondylitic change present and some narrowing which I  appreciated intraoperatively so I went ahead and performed  hemilaminectomies of the last lamina of what I am calling L5.  I then  opened the disk space to remove what was degenerated disk.  I took off  some osteophytes from the posterior wall.  Fully decompressed both  inferior nerve roots and superior nerve roots.  I then placed 11 mm PEEK  cages packed with morselized autograft into the disk space which was  prepared for arthrodesis using curettes and disk space shavers.  I then  went to the L4-5 space and placed two cages 13 mm after performing  diskectomies via posterior approach.  The disk was removed, 13 mm cages  placed filled with morselized auto and  allograft.  A high-speed drill  was used to perform the hemilaminectomies.  Again the posterior lip was  removed.  I was able to remove a great deal of soft tissue from the disk  space both levels.  The disk space at 5-1 was partially calcified.  I  achieved very good decompression of thecal sac.  I used a Spire plate at  J4-7 as I did not believe that there was need for augmentation at the  last disk space as I was not able appreciate any movement whatsoever  after placement of the cages.  I packed the facettes at L4-5 after  decorticating them with a curette.  Vitoss was placed into the disk  space prior to placement of the 13 mm cages.  X-ray showed the cages and  the Spire plate to be in good position.  I irrigated the wound.  I then  closed wound layered fashion.  There was a dural opening  which was repaired primarily with a U clip.  I then placed piece of  Duragen over that and Tisseel over the entire resection site.  This was  done prior to closure.  I closed the skin edges,  reapproximated the skin  edges with a running 3-0 nylon suture.  Sterile dressing applied.  The  patient tolerated procedure well.           ______________________________  Coletta Memos, M.D.     KC/MEDQ  D:  07/10/2006  T:  07/11/2006  Job:  [id number]

## 2010-12-15 NOTE — Op Note (Signed)
[name], [name]               ACCOUNT NO.:  [id number]   MEDICAL RECORD NO.:  [id number]          PATIENT TYPE:  INP   LOCATION:  3104                         FACILITY:  [facility]   PHYSICIAN:  Coletta Memos, M.D.     DATE OF BIRTH:  [birth year]   DATE OF PROCEDURE:  07/16/2006  DATE OF DISCHARGE:  07/22/2006                               OPERATIVE REPORT   PREOPERATIVE DIAGNOSIS:  Spinal fluid leak.   POSTOPERATIVE DIAGNOSIS:  Spinal fluid leak and loosening of Spire  plate.   PROCEDURE:  1. Repair of spinal fluid leak with Duragen and allograft patch and      the use of Tisseel.  2. Replacement of locking screws for a Spire plate at E4-5.  3. Placement of lumbar drain.   COMPLICATIONS:  None.   SURGEON:  Coletta Memos, M.D.   INDICATIONS:  [name] is a woman whom I took to the operating room  last week for posterior lumbar interbody fusion.  She had a spinal fluid  leak which I did repair primarily and used Tisseel and a Duragen patch.  Postoperatively she did well while kept flat in bed and for the first  two days when she was up, but yesterday had some fluid leaking from her  wound.  That did not subside with a night of bedrest and took her back  to the operating room today for exploration.   OPERATIVE NOTE:  [name] was brought to the operating room intubated,  and placed under general anesthetic without difficulty.  She was rolled  prone onto a [name] frame and all pressure points properly padded.  Back  was prepped.  She was draped.  Sutures were removed.  I opened the wound  and there was a large amount of serosanguineous fluid which emanated  from the wound.  I placed self-retaining retractors and then identified  spinal fluid leak.  Spinal fluid was still leaking from this area.  I  also inspected the spire plate and it was actually quite loose at that  time.  I removed a spire plate and then concentrated on the wound  repair.  I did not believe I could  place another suture in the dura at  that point because it was quite thin and frankly was very poor to hold.  So I placed a piece of muscle over the opening, some Tisseel and  Duragen.  I then sewed a piece of bovine allograft around this area  again loosely securing it because the surrounding dura was not all that  __________ in holding a stitch.  I placed Tisseel over that area.  I  reattached a spire plate closing and then placing a new locking screw  and broke that off.  The plate seem secure postoperatively as it was  much tighter than it was when I opened the wound.  I then placed a  lumbar drain into the patient and brought that out via separate stab  incision.  I closed the wound in layered fashion  using Vicryl sutures.  Skin was reapproximated with vertical interrupted  mattress sutures.  The lumbar drain was secured to the skin with a nylon  suture.  Sterile dressing was applied.  She was then rolled supine and  extubated and taken to the ICU for recovery.           ______________________________  Coletta Memos, M.D.     KC/MEDQ  D:  07/22/2006  T:  07/22/2006  Job:  [id number]

## 2011-03-16 ENCOUNTER — Emergency Department: Payer: Self-pay | Admitting: Emergency Medicine

## 2011-03-17 ENCOUNTER — Emergency Department: Payer: Self-pay | Admitting: Emergency Medicine

## 2011-03-18 ENCOUNTER — Emergency Department: Payer: Self-pay | Admitting: *Deleted

## 2011-04-19 ENCOUNTER — Other Ambulatory Visit: Payer: Self-pay | Admitting: Neurosurgery

## 2011-04-19 DIAGNOSIS — M546 Pain in thoracic spine: Secondary | ICD-10-CM

## 2011-04-19 DIAGNOSIS — M549 Dorsalgia, unspecified: Secondary | ICD-10-CM

## 2011-05-01 LAB — CBC
Hemoglobin: 13.5
MCHC: 33.6
RBC: 4.45

## 2011-05-04 ENCOUNTER — Other Ambulatory Visit: Payer: Self-pay | Admitting: *Deleted

## 2011-05-04 MED ORDER — ESOMEPRAZOLE MAGNESIUM 40 MG PO CPDR: 40.0000 mg | DELAYED_RELEASE_CAPSULE | Freq: Every day | ORAL | Status: DC

## 2011-06-02 ENCOUNTER — Ambulatory Visit
Admission: RE | Admit: 2011-06-02 | Discharge: 2011-06-02 | Disposition: A | Discharge status: Home / Self Care | Payer: Self-pay | Source: Ambulatory Visit | Attending: Neurosurgery | Admitting: Neurosurgery

## 2011-06-02 DIAGNOSIS — M549 Dorsalgia, unspecified: Secondary | ICD-10-CM

## 2011-06-02 DIAGNOSIS — M546 Pain in thoracic spine: Secondary | ICD-10-CM

## 2011-06-02 MED ORDER — GADOBENATE DIMEGLUMINE 529 MG/ML IV SOLN
20.0000 mL | Freq: Once | INTRAVENOUS | Status: AC | PRN
  Administered 2011-06-02: 20 mL via INTRAVENOUS

## 2011-11-23 DIAGNOSIS — J45909 Unspecified asthma, uncomplicated: Secondary | ICD-10-CM | POA: Insufficient documentation

## 2012-01-10 ENCOUNTER — Other Ambulatory Visit: Payer: Self-pay | Admitting: Neurosurgery

## 2012-01-10 DIAGNOSIS — M549 Dorsalgia, unspecified: Secondary | ICD-10-CM

## 2012-01-11 ENCOUNTER — Ambulatory Visit
Admission: RE | Admit: 2012-01-11 | Discharge: 2012-01-11 | Disposition: A | Discharge status: Home / Self Care | Payer: Medicare Other | Source: Ambulatory Visit | Attending: Neurosurgery | Admitting: Neurosurgery

## 2012-01-11 DIAGNOSIS — M549 Dorsalgia, unspecified: Secondary | ICD-10-CM

## 2012-01-24 DIAGNOSIS — R200 Anesthesia of skin: Secondary | ICD-10-CM | POA: Insufficient documentation

## 2012-03-11 DIAGNOSIS — R200 Anesthesia of skin: Secondary | ICD-10-CM | POA: Insufficient documentation

## 2012-03-11 DIAGNOSIS — R202 Paresthesia of skin: Secondary | ICD-10-CM | POA: Insufficient documentation

## 2012-06-16 DIAGNOSIS — G709 Myoneural disorder, unspecified: Secondary | ICD-10-CM | POA: Insufficient documentation

## 2012-06-16 DIAGNOSIS — E669 Obesity, unspecified: Secondary | ICD-10-CM | POA: Insufficient documentation

## 2012-09-01 ENCOUNTER — Other Ambulatory Visit: Payer: Self-pay | Admitting: Neurosurgery

## 2012-09-01 DIAGNOSIS — M47816 Spondylosis without myelopathy or radiculopathy, lumbar region: Secondary | ICD-10-CM

## 2012-09-01 DIAGNOSIS — M545 Low back pain: Secondary | ICD-10-CM

## 2012-09-08 ENCOUNTER — Ambulatory Visit
Admission: RE | Admit: 2012-09-08 | Discharge: 2012-09-08 | Disposition: A | Discharge status: Home / Self Care | Payer: Medicare Other | Source: Ambulatory Visit | Attending: Neurosurgery | Admitting: Neurosurgery

## 2012-09-08 DIAGNOSIS — M545 Low back pain: Secondary | ICD-10-CM

## 2012-09-08 DIAGNOSIS — M47816 Spondylosis without myelopathy or radiculopathy, lumbar region: Secondary | ICD-10-CM

## 2012-09-08 MED ORDER — GADOBENATE DIMEGLUMINE 529 MG/ML IV SOLN
20.0000 mL | Freq: Once | INTRAVENOUS | Status: AC | PRN
  Administered 2012-09-08: 20 mL via INTRAVENOUS

## 2012-10-02 DIAGNOSIS — M65311 Trigger thumb, right thumb: Secondary | ICD-10-CM | POA: Insufficient documentation

## 2012-10-19 ENCOUNTER — Emergency Department: Payer: Self-pay | Admitting: Unknown Physician Specialty

## 2012-10-19 LAB — CBC
MCH: 30 pg (ref 26.0–34.0)
MCV: 89 fL (ref 80–100)
RBC: 4.19 10*6/uL (ref 3.80–5.20)
RDW: 14 % (ref 11.5–14.5)

## 2012-10-19 LAB — RAPID INFLUENZA A&B ANTIGENS

## 2012-10-19 LAB — COMPREHENSIVE METABOLIC PANEL
Creatinine: 0.68 mg/dL (ref 0.60–1.30)
EGFR (African American): >60
Osmolality: 272 (ref 275–301)

## 2012-10-19 LAB — MAGNESIUM: Magnesium: 1.8 mg/dL

## 2012-10-19 LAB — PRO B NATRIURETIC PEPTIDE: B-Type Natriuretic Peptide: 24 pg/mL (ref 0–125)

## 2012-10-25 LAB — CULTURE, BLOOD (SINGLE)

## 2013-06-17 ENCOUNTER — Emergency Department: Payer: Self-pay | Admitting: Emergency Medicine

## 2013-06-20 LAB — BETA STREP CULTURE(ARMC)

## 2014-05-11 ENCOUNTER — Ambulatory Visit: Payer: Self-pay | Admitting: Internal Medicine

## 2014-05-11 LAB — CBC CANCER CENTER
BASOS PCT: 0.7 %
Basophil #: 0.1 x10 3/mm (ref 0.0–0.1)
Eosinophil #: 0.2 x10 3/mm (ref 0.0–0.7)
Eosinophil %: 1.6 %
HCT: 40.8 % (ref 35.0–47.0)
HGB: 13.6 g/dL (ref 12.0–16.0)
LYMPHS ABS: 3.7 x10 3/mm — AB (ref 1.0–3.6)
LYMPHS PCT: 27 %
MCH: 30.2 pg (ref 26.0–34.0)
MCHC: 33.3 g/dL (ref 32.0–36.0)
MCV: 91 fL (ref 80–100)
MONOS PCT: 4.6 %
Monocyte #: 0.6 x10 3/mm (ref 0.2–0.9)
NEUTROS ABS: 9.2 x10 3/mm — AB (ref 1.4–6.5)
NEUTROS PCT: 66.1 %
PLATELETS: 376 x10 3/mm (ref 150–440)
RBC: 4.49 10*6/uL (ref 3.80–5.20)
RDW: 13.5 % (ref 11.5–14.5)
WBC: 13.9 x10 3/mm — ABNORMAL HIGH (ref 3.6–11.0)

## 2014-05-11 LAB — URINALYSIS, COMPLETE
BILIRUBIN, UR: NEGATIVE
GLUCOSE, UR: NEGATIVE
Ketone: NEGATIVE
LEUKOCYTE ESTERASE: NEGATIVE
Nitrite: NEGATIVE
PROTEIN: NEGATIVE
Ph: 5.5 (ref 5.0–8.0)

## 2014-05-11 LAB — IRON AND TIBC
Iron Bind.Cap.(Total): 319 ug/dL (ref 250–450)
Iron Saturation: 18 %
Iron: 59 ug/dL (ref 50–170)
UNBOUND IRON-BIND. CAP.: 260 ug/dL

## 2014-05-11 LAB — FERRITIN: Ferritin (ARMC): 81 ng/mL (ref 8–388)

## 2014-05-13 LAB — URINE CULTURE

## 2014-05-30 ENCOUNTER — Ambulatory Visit: Payer: Self-pay | Admitting: Internal Medicine

## 2014-06-01 LAB — CBC CANCER CENTER
BASOS PCT: 1.4 %
Basophil #: 0.2 x10 3/mm — ABNORMAL HIGH (ref 0.0–0.1)
EOS ABS: 0.2 x10 3/mm (ref 0.0–0.7)
EOS PCT: 1.8 %
HCT: 42.5 % (ref 35.0–47.0)
HGB: 14.1 g/dL (ref 12.0–16.0)
LYMPHS PCT: 30.3 %
Lymphocyte #: 3.5 x10 3/mm (ref 1.0–3.6)
MCH: 29.9 pg (ref 26.0–34.0)
MCHC: 33 g/dL (ref 32.0–36.0)
MCV: 90 fL (ref 80–100)
Monocyte #: 0.6 x10 3/mm (ref 0.2–0.9)
Monocyte %: 5 %
Neutrophil #: 7 x10 3/mm — ABNORMAL HIGH (ref 1.4–6.5)
Neutrophil %: 61.5 %
Platelet: 366 x10 3/mm (ref 150–440)
RBC: 4.71 10*6/uL (ref 3.80–5.20)
RDW: 13.8 % (ref 11.5–14.5)
WBC: 11.4 x10 3/mm — ABNORMAL HIGH (ref 3.6–11.0)

## 2014-06-04 LAB — OCCULT BLOOD X 1 CARD TO LAB, STOOL
Occult Blood, Feces: NEGATIVE
Occult Blood, Feces: NEGATIVE
Occult Blood, Feces: NEGATIVE

## 2014-06-29 ENCOUNTER — Ambulatory Visit: Payer: Self-pay | Admitting: Internal Medicine

## 2014-06-29 LAB — CBC CANCER CENTER
BASOS ABS: 0.1 x10 3/mm (ref 0.0–0.1)
BASOS PCT: 0.8 %
EOS ABS: 0.2 x10 3/mm (ref 0.0–0.7)
Eosinophil %: 1.5 %
HCT: 43.9 % (ref 35.0–47.0)
HGB: 14.3 g/dL (ref 12.0–16.0)
LYMPHS PCT: 23.9 %
Lymphocyte #: 3.1 x10 3/mm (ref 1.0–3.6)
MCH: 29.6 pg (ref 26.0–34.0)
MCHC: 32.6 g/dL (ref 32.0–36.0)
MCV: 91 fL (ref 80–100)
MONOS PCT: 5.6 %
Monocyte #: 0.7 x10 3/mm (ref 0.2–0.9)
NEUTROS ABS: 8.9 x10 3/mm — AB (ref 1.4–6.5)
NEUTROS PCT: 68.2 %
PLATELETS: 374 x10 3/mm (ref 150–440)
RBC: 4.83 10*6/uL (ref 3.80–5.20)
RDW: 13.7 % (ref 11.5–14.5)
WBC: 13.1 x10 3/mm — ABNORMAL HIGH (ref 3.6–11.0)

## 2014-06-29 LAB — IRON AND TIBC
IRON BIND. CAP.(TOTAL): 322 ug/dL (ref 250–450)
Iron Saturation: 17 %
Iron: 55 ug/dL (ref 50–170)
Unbound Iron-Bind.Cap.: 267 ug/dL

## 2014-06-29 LAB — FERRITIN: Ferritin (ARMC): 83 ng/mL (ref 8–388)

## 2014-07-30 ENCOUNTER — Ambulatory Visit: Payer: Self-pay | Admitting: Internal Medicine

## 2014-08-10 LAB — CBC CANCER CENTER
BASOS ABS: 0.2 x10 3/mm — AB (ref 0.0–0.1)
Basophil %: 1.2 %
EOS ABS: 0.2 x10 3/mm (ref 0.0–0.7)
Eosinophil %: 1.6 %
HCT: 42 % (ref 35.0–47.0)
HGB: 13.6 g/dL (ref 12.0–16.0)
Lymphocyte #: 3.6 x10 3/mm (ref 1.0–3.6)
Lymphocyte %: 24.5 %
MCH: 29.3 pg (ref 26.0–34.0)
MCHC: 32.5 g/dL (ref 32.0–36.0)
MCV: 90 fL (ref 80–100)
MONOS PCT: 5.2 %
Monocyte #: 0.8 x10 3/mm (ref 0.2–0.9)
NEUTROS PCT: 67.5 %
Neutrophil #: 9.8 x10 3/mm — ABNORMAL HIGH (ref 1.4–6.5)
PLATELETS: 382 x10 3/mm (ref 150–440)
RBC: 4.65 10*6/uL (ref 3.80–5.20)
RDW: 14 % (ref 11.5–14.5)
WBC: 14.5 x10 3/mm — ABNORMAL HIGH (ref 3.6–11.0)

## 2014-08-30 ENCOUNTER — Ambulatory Visit: Payer: Self-pay | Admitting: Family Medicine

## 2014-08-30 ENCOUNTER — Ambulatory Visit: Payer: Self-pay | Admitting: Internal Medicine

## 2014-09-02 ENCOUNTER — Ambulatory Visit: Payer: Self-pay | Admitting: Gastroenterology

## 2014-09-15 DIAGNOSIS — K22719 Barrett's esophagus with dysplasia, unspecified: Secondary | ICD-10-CM | POA: Insufficient documentation

## 2014-10-04 ENCOUNTER — Ambulatory Visit: Payer: Self-pay | Admitting: Family Medicine

## 2014-10-13 ENCOUNTER — Ambulatory Visit: Payer: Self-pay | Admitting: Urology

## 2014-11-03 ENCOUNTER — Ambulatory Visit
Admit: 2014-11-03 | Disposition: A | Discharge status: Home / Self Care | Payer: Self-pay | Attending: Internal Medicine | Admitting: Internal Medicine

## 2014-11-03 LAB — CBC CANCER CENTER
BASOS ABS: 0.2 x10 3/mm — AB (ref 0.0–0.1)
Basophil %: 1.1 %
EOS ABS: 0.3 x10 3/mm (ref 0.0–0.7)
Eosinophil %: 1.8 %
HCT: 41.8 % (ref 35.0–47.0)
HGB: 13.9 g/dL (ref 12.0–16.0)
Lymphocyte #: 2.8 x10 3/mm (ref 1.0–3.6)
Lymphocyte %: 20.4 %
MCH: 29.4 pg (ref 26.0–34.0)
MCHC: 33.2 g/dL (ref 32.0–36.0)
MCV: 89 fL (ref 80–100)
MONOS PCT: 5.1 %
Monocyte #: 0.7 x10 3/mm (ref 0.2–0.9)
NEUTROS PCT: 71.6 %
Neutrophil #: 9.9 x10 3/mm — ABNORMAL HIGH (ref 1.4–6.5)
Platelet: 321 x10 3/mm (ref 150–440)
RBC: 4.72 10*6/uL (ref 3.80–5.20)
RDW: 14.6 % — AB (ref 11.5–14.5)
WBC: 13.8 x10 3/mm — AB (ref 3.6–11.0)

## 2014-12-09 ENCOUNTER — Other Ambulatory Visit: Payer: Self-pay | Admitting: Family Medicine

## 2014-12-09 DIAGNOSIS — Z1231 Encounter for screening mammogram for malignant neoplasm of breast: Secondary | ICD-10-CM

## 2014-12-16 ENCOUNTER — Ambulatory Visit: Payer: Medicare Other

## 2014-12-21 ENCOUNTER — Ambulatory Visit: Payer: Self-pay | Admitting: Internal Medicine

## 2014-12-21 ENCOUNTER — Ambulatory Visit: Payer: Medicare Other

## 2014-12-23 ENCOUNTER — Ambulatory Visit
Admission: RE | Admit: 2014-12-23 | Discharge: 2014-12-23 | Disposition: A | Discharge status: Home / Self Care | Payer: PPO | Source: Ambulatory Visit | Attending: Family Medicine | Admitting: Family Medicine

## 2014-12-23 DIAGNOSIS — Z1231 Encounter for screening mammogram for malignant neoplasm of breast: Secondary | ICD-10-CM | POA: Insufficient documentation

## 2014-12-28 ENCOUNTER — Other Ambulatory Visit: Payer: Self-pay

## 2014-12-28 ENCOUNTER — Ambulatory Visit: Payer: Self-pay | Admitting: Internal Medicine

## 2014-12-31 ENCOUNTER — Inpatient Hospital Stay (HOSPITAL_BASED_OUTPATIENT_CLINIC_OR_DEPARTMENT_OTHER): Payer: PPO | Admitting: Oncology

## 2014-12-31 ENCOUNTER — Encounter: Payer: Self-pay | Admitting: Oncology

## 2014-12-31 ENCOUNTER — Inpatient Hospital Stay: Payer: PPO | Attending: Oncology

## 2014-12-31 DIAGNOSIS — F17210 Nicotine dependence, cigarettes, uncomplicated: Secondary | ICD-10-CM

## 2014-12-31 DIAGNOSIS — D649 Anemia, unspecified: Secondary | ICD-10-CM | POA: Insufficient documentation

## 2014-12-31 DIAGNOSIS — K589 Irritable bowel syndrome without diarrhea: Secondary | ICD-10-CM

## 2014-12-31 DIAGNOSIS — Z8669 Personal history of other diseases of the nervous system and sense organs: Secondary | ICD-10-CM

## 2014-12-31 DIAGNOSIS — J45909 Unspecified asthma, uncomplicated: Secondary | ICD-10-CM

## 2014-12-31 DIAGNOSIS — Z90710 Acquired absence of both cervix and uterus: Secondary | ICD-10-CM

## 2014-12-31 DIAGNOSIS — K219 Gastro-esophageal reflux disease without esophagitis: Secondary | ICD-10-CM | POA: Diagnosis not present

## 2014-12-31 DIAGNOSIS — D72829 Elevated white blood cell count, unspecified: Secondary | ICD-10-CM | POA: Diagnosis not present

## 2014-12-31 DIAGNOSIS — D729 Disorder of white blood cells, unspecified: Secondary | ICD-10-CM

## 2014-12-31 LAB — CBC WITH DIFFERENTIAL/PLATELET
BASOS PCT: 1 %
Basophils Absolute: 0.1 10*3/uL (ref 0–0.1)
EOS PCT: 2 %
Eosinophils Absolute: 0.2 10*3/uL (ref 0–0.7)
HEMATOCRIT: 44 % (ref 35.0–47.0)
Hemoglobin: 14.5 g/dL (ref 12.0–16.0)
Lymphocytes Relative: 24 %
Lymphs Abs: 2.8 10*3/uL (ref 1.0–3.6)
MCH: 29.3 pg (ref 26.0–34.0)
MCHC: 32.9 g/dL (ref 32.0–36.0)
MCV: 88.9 fL (ref 80.0–100.0)
MONOS PCT: 6 %
Monocytes Absolute: 0.7 10*3/uL (ref 0.2–0.9)
Neutro Abs: 7.8 10*3/uL — ABNORMAL HIGH (ref 1.4–6.5)
Neutrophils Relative %: 67 %
Platelets: 333 10*3/uL (ref 150–440)
RBC: 4.95 MIL/uL (ref 3.80–5.20)
RDW: 14.4 % (ref 11.5–14.5)
WBC: 11.6 10*3/uL — AB (ref 3.6–11.0)

## 2015-01-04 ENCOUNTER — Other Ambulatory Visit: Payer: Self-pay | Admitting: Family Medicine

## 2015-01-14 IMAGING — CR DG LUMBAR SPINE COMPLETE 4+V
4 series · 4 of 4 positions shown · non-contrast
Comparison: MR lumbar spine 09/08/2012 and CT lumbar spine
01/11/2012.

CLINICAL DATA: Low back pain.

LUMBAR SPINE - COMPLETE 4+ VIEW

[view not recorded (1 of 4)]
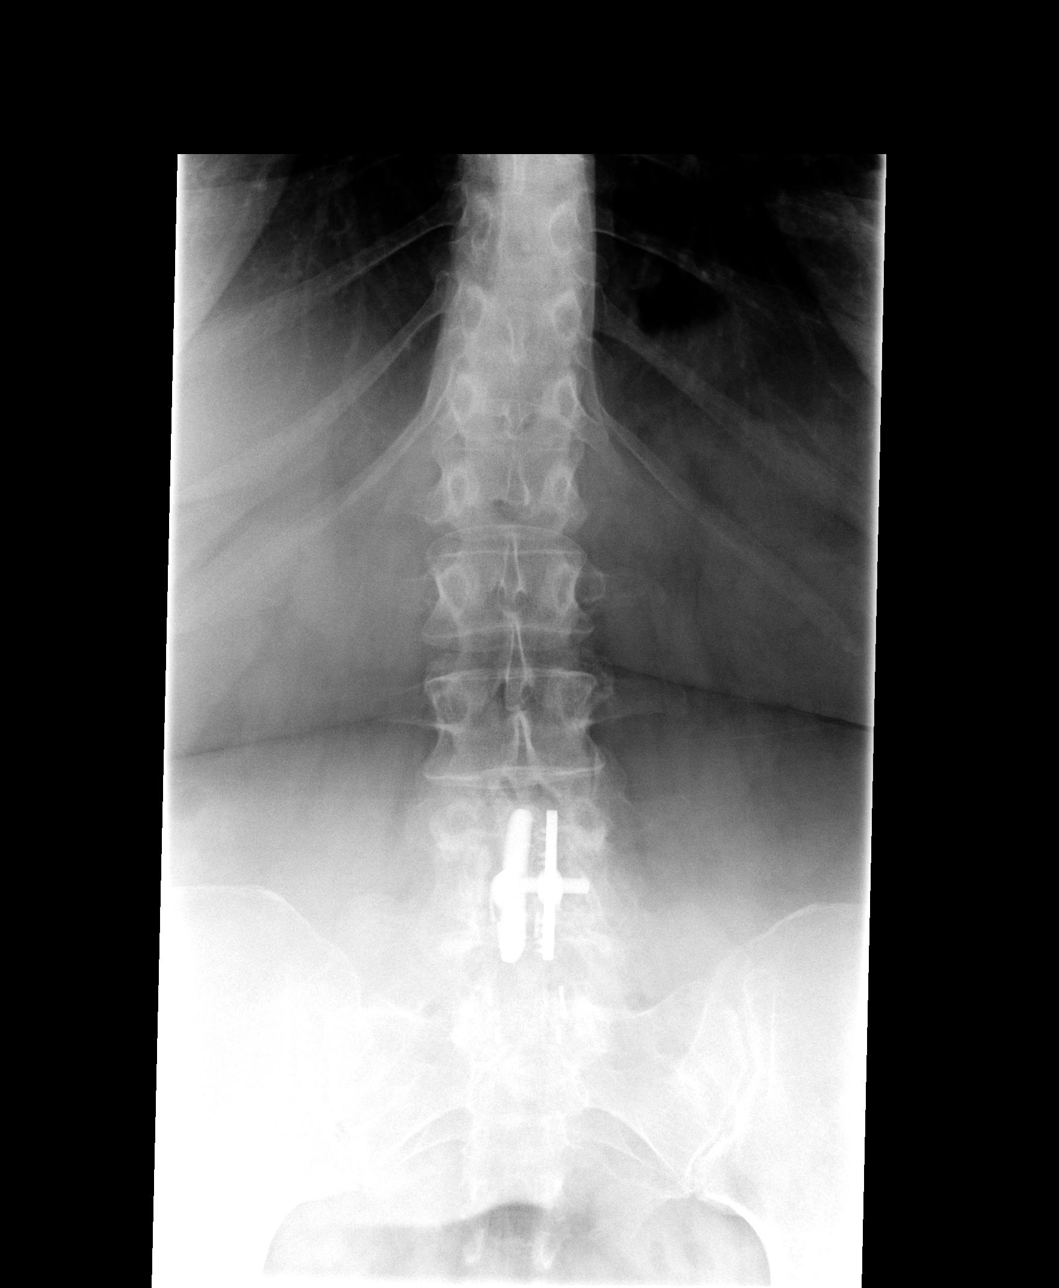

[view not recorded (2 of 4)]
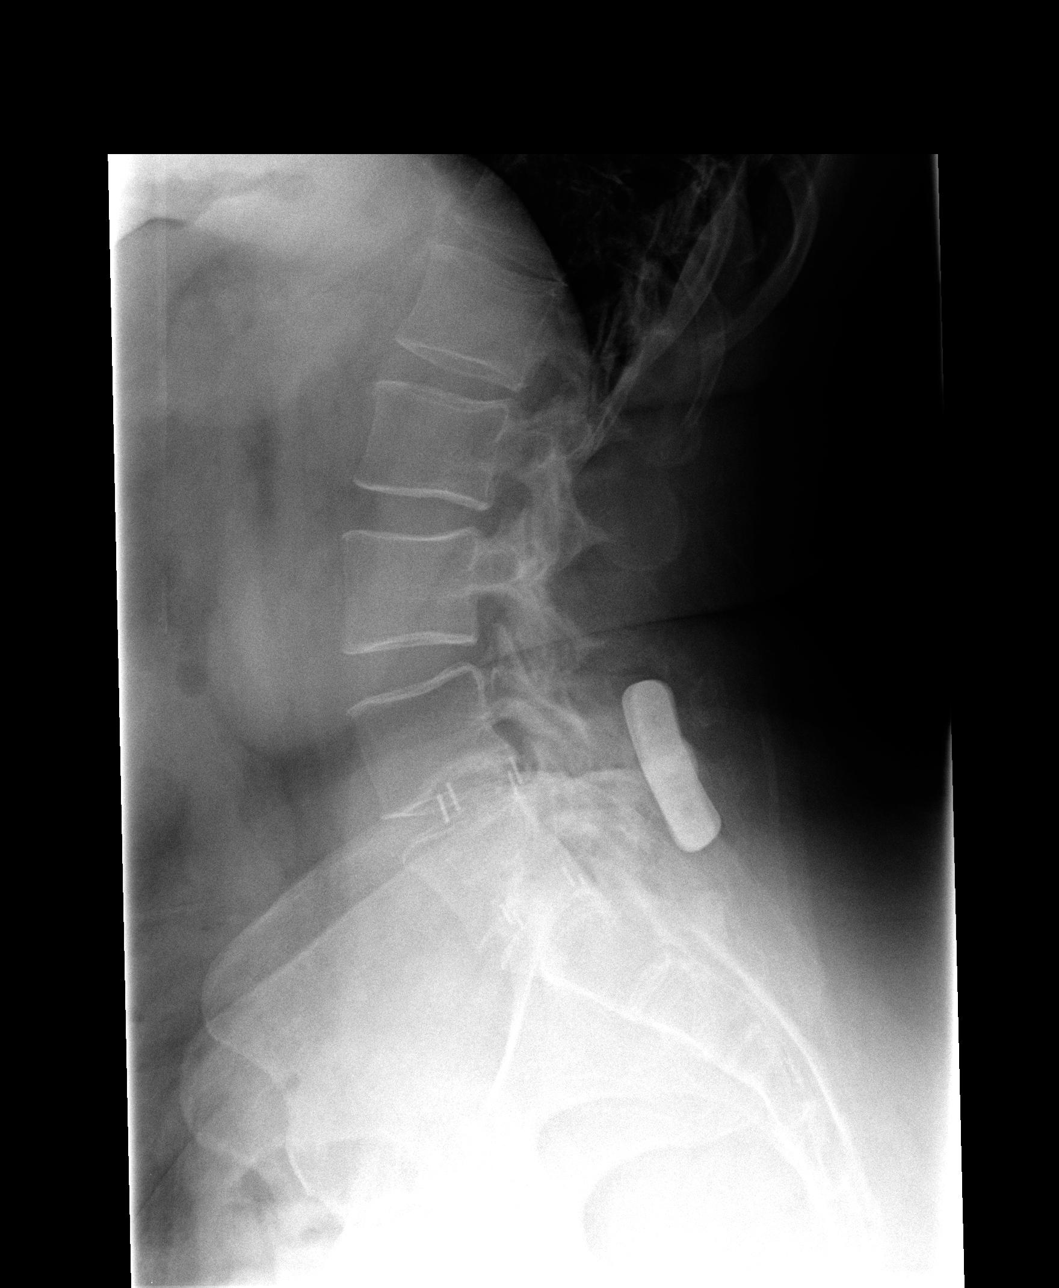

[view not recorded (3 of 4)]
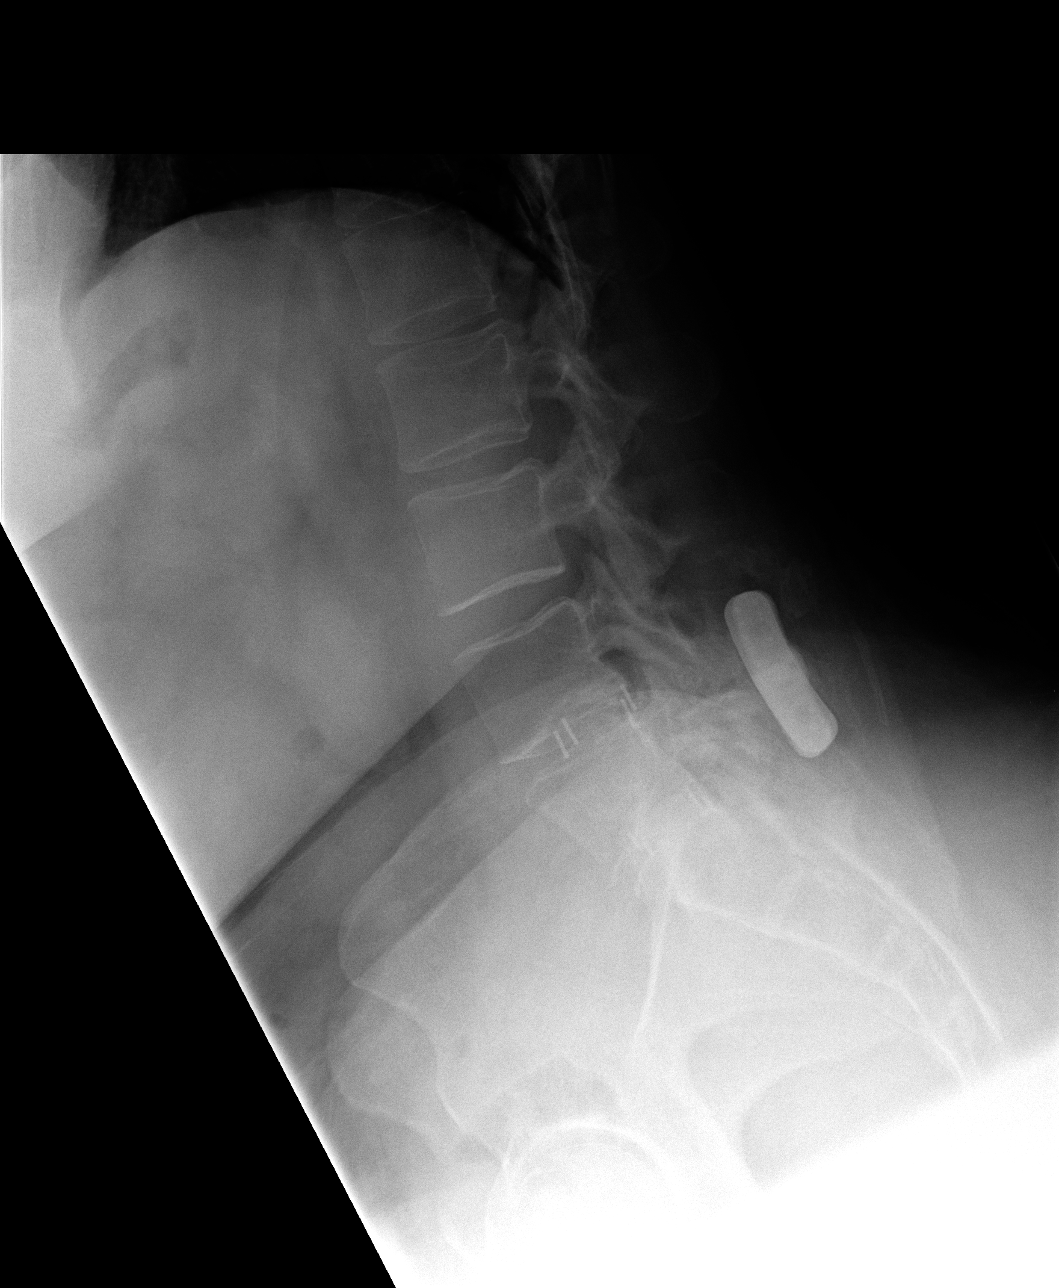

[view not recorded (4 of 4)]
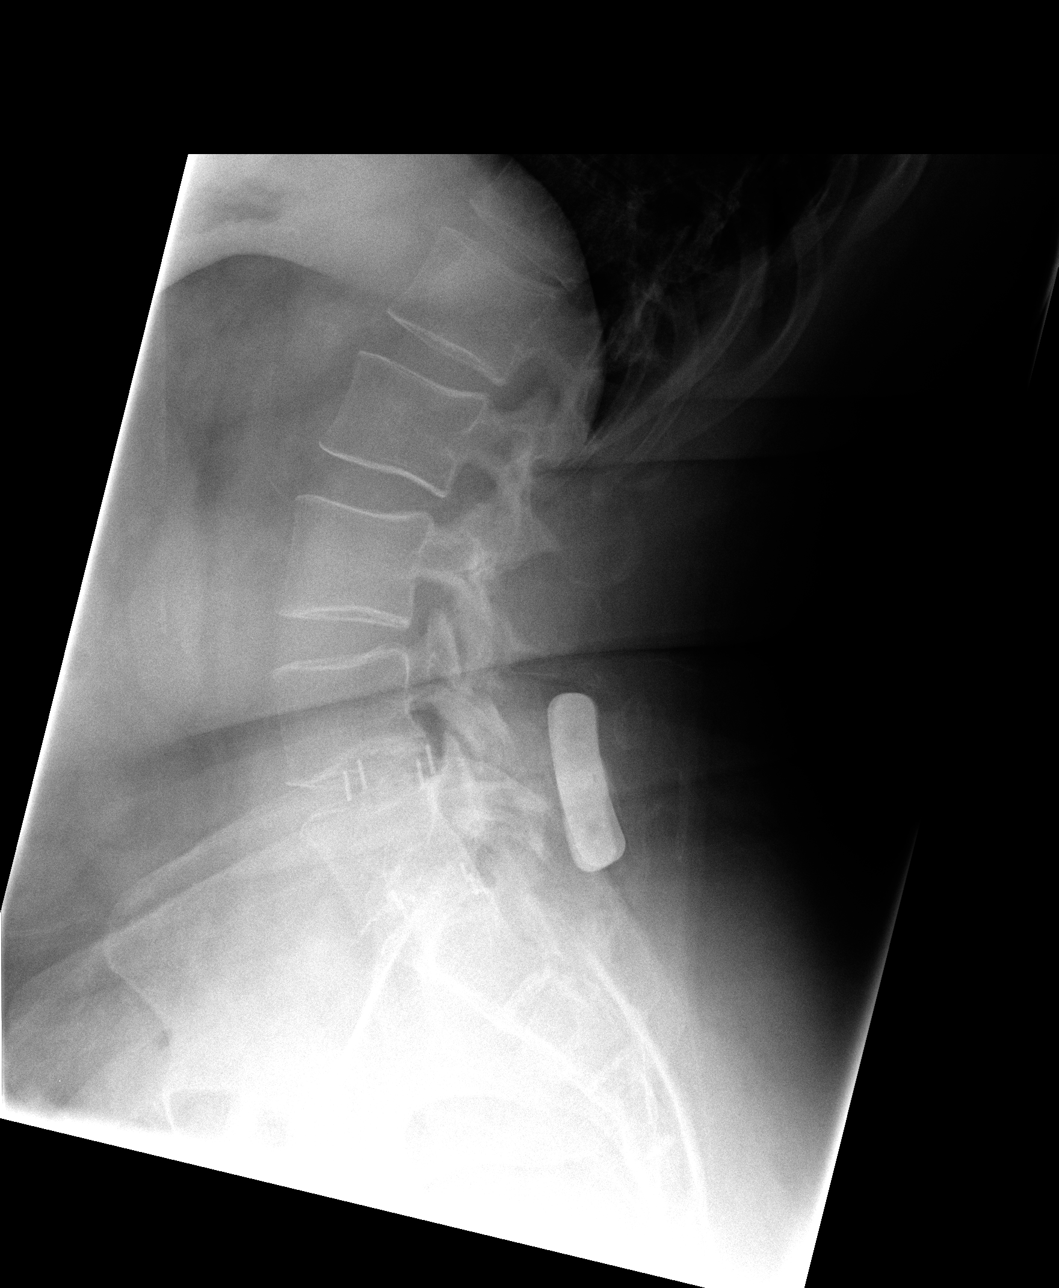

[4 of 4 positions shown; findings below may reference images not displayed]

FINDINGS: Alignment is anatomic.  The patient is status post L4-5
and L5-S1 interbody and interspinous fusion. L4-5 pseudoarthrosis
described on 01/11/2012 is better appreciated on the study.
Vertebral body and disc space height are otherwise maintained.
Mild facet sclerosis in the mid lumbar spine.  No pathologic motion
with flexion and extension.
IMPRESSION: 1. L4 - S1 interbody interspinous fusion.  Findings of
pseudoarthrosis at the L4-5 level are better seen on cross-
sectional imaging performed 01/11/2012.
[DATE].  Facet hypertrophy in the mid lumbar spine.

## 2015-01-29 NOTE — Progress Notes (Signed)
[facility]  [phone] [phone]  ID: [name] OB: 10/22/1975  MR#: [id number]  CSN#:[id number]  Patient Care Team: Charles W Phillips Jr., MD as PCP - General  CHIEF COMPLAINT:  Chief Complaint  Patient presents with  . Follow-up    anemia     INTERVAL HISTORY: Patient returns to clinic today for repeat laboratory work and further evaluation. She continues to feel well and remains asymptomatic. She states she is always cold, but denies any weakness or fatigue. She has no neurologic complaints. She denies any weight loss. She denies any chest pain or shortness of breath. She denies any nausea, vomiting, cons patient, or diarrhea. She denies any melanotic or hematochezia. Patient otherwise feels well and offers no further specific complaints.  REVIEW OF SYSTEMS:   Review of Systems  Constitutional: Negative.  Negative for malaise/fatigue.  Respiratory: Negative.   Cardiovascular: Negative.   Gastrointestinal: Negative for blood in stool and melena.  Neurological: Negative for weakness.    As per HPI. Otherwise, a complete review of systems is negatve.  PAST MEDICAL HISTORY: Past Medical History  Diagnosis Date  . Asthma   . GERD (gastroesophageal reflux disease)   . Migraines     PAST SURGICAL HISTORY: Past Surgical History  Procedure Laterality Date  . Tonsillectomy    . Back surgery  07/10/2006    L4-L5   . Abdominal hysterectomy      2004    FAMILY HISTORY Family History  Problem Relation Age of Onset  . Ovarian cancer Sister 33       ADVANCED DIRECTIVES:    HEALTH MAINTENANCE: History  Substance Use Topics  . Smoking status: Current Every Day Smoker -- 0.25 packs/day    Types: Cigarettes  . Smokeless tobacco: Never Used  . Alcohol Use: No     Colonoscopy:  PAP:  Bone density:  Lipid panel:  Allergies  Allergen Reactions  . Penicillins Anaphylaxis  . Oxycodone Itching and Nausea Only   "bugs crawling on me"  . Prednisone     Altered mental status  . Quinolones     Other reaction(s): Other (See Comments) BLURRY VISION  . Latex Rash    Latex tape only per pt.Not allergic to latex gloves or other products USE PAPER TAPE ONLY PLEASE    Current Outpatient Prescriptions  Medication Sig Dispense Refill  . albuterol (PROVENTIL HFA;VENTOLIN HFA) 108 (90 BASE) MCG/ACT inhaler Inhale into the lungs.    . albuterol (PROVENTIL) (2.5 MG/3ML) 0.083% nebulizer solution Inhale into the lungs.    . cetirizine (ZYRTEC) 10 MG tablet Take by mouth.    . montelukast (SINGULAIR) 10 MG tablet Take by mouth.    . omeprazole (PRILOSEC) 40 MG capsule Take by mouth.    . tiZANidine (ZANAFLEX) 4 MG tablet Take by mouth.    . esomeprazole (NEXIUM) 40 MG capsule Take 1 capsule (40 mg total) by mouth daily. 30 capsule 1   No current facility-administered medications for this visit.    OBJECTIVE: Filed Vitals:   12/31/14 0950  BP: 145/60  Pulse: 70  Temp: 96.1 F (35.6 C)  Resp: 20     Body mass index is 37.8 kg/(m^2).    ECOG FS:0 - Asymptomatic  General: Well-developed, well-nourished, no acute distress. Eyes: Pink conjunctiva, anicteric sclera. Lungs: Clear to auscultation bilaterally. Heart: Regular rate and rhythm. No rubs, murmurs, or gallops. Abdomen: Soft, nontender, nondistended. No organomegaly noted, normoactive bowel sounds. Musculoskeletal: No edema, cyanosis, or clubbing.   Neuro: Alert, answering all questions appropriately. Cranial nerves grossly intact. Skin: No rashes or petechiae noted. Psych: Normal affect.   LAB RESULTS:  Lab Results  Component Value Date   NA 136 10/19/2012   K 3.8 10/19/2012   CL 105 10/19/2012   CO2 24 10/19/2012   GLUCOSE 117* 10/19/2012   BUN 10 10/19/2012   CREATININE 0.68 10/19/2012   CALCIUM 8.6 10/19/2012   PROT 7.4 10/19/2012   ALBUMIN 3.8 10/19/2012   AST 22 10/19/2012   ALT 23 10/19/2012   ALKPHOS 87 10/19/2012    BILITOT 0.3 10/03/2009   GFRNONAA >60 10/19/2012   GFRAA >60 10/19/2012    Lab Results  Component Value Date   WBC 11.6* 12/31/2014   NEUTROABS 7.8* 12/31/2014   HGB 14.5 12/31/2014   HCT 44.0 12/31/2014   MCV 88.9 12/31/2014   PLT 333 12/31/2014     STUDIES: No results found.  ASSESSMENT: Leukocytosis, history of anemia.  PLAN:    1. Leukocytosis: Patient's white blood cell count remains to be mildly elevated, but essentially unchanged since 2009.  Previously for workup including CML, CLL, and JAK-2 was reported as negative. No further intervention is needed at this time. Patient does not require bone marrow biopsy. 2. Anemia: Patient's hemoglobin is now within normal limits. She has a history of IBS as well as microscopic hematuria. Previously, stool cards are negative as well. No intervention is needed at this time. No follow-up is necessary. Patient has been instructed to have her primary care physician monitor her blood counts 1-2 times per year and refer her back if she becomes progressively more anemic or her white blood cell count begins to increase.  Patient expressed understanding and was in agreement with this plan. She also understands that She can call clinic at any time with any questions, concerns, or complaints.   Lloyd Huger, MD   01/29/2015 12:38 PM

## 2015-02-01 ENCOUNTER — Encounter: Payer: Self-pay | Admitting: Oncology

## 2015-02-02 LAB — MISC LABCORP TEST (SEND OUT): LABCORP TEST CODE: 4802620

## 2015-03-18 ENCOUNTER — Encounter: Payer: Self-pay | Admitting: Internal Medicine

## 2015-04-06 NOTE — Discharge Instructions (Signed)
[facility] °[facility] °ENDOSCOPIC SINUS SURGERY °[facility] ° °What is Functional Endoscopic Sinus Surgery? ° The Surgery involves making the natural openings of the sinuses larger by removing the bony partitions that separate the sinuses from the nasal cavity.  The natural sinus lining is preserved as much as possible to allow the sinuses to resume normal function after the surgery.  In some patients nasal polyps (excessively swollen lining of the sinuses) may be removed to relieve obstruction of the sinus openings.  The surgery is performed through the nose using lighted scopes, which eliminates the need for incisions on the face.  A septoplasty is a different procedure which is sometimes performed with sinus surgery.  It involves straightening the boy partition that separates the two sides of your nose.  A crooked or deviated septum may need repair if is obstructing the sinuses or nasal airflow.  Turbinate reduction is also often performed during sinus surgery.  The turbinates are bony proturberances from the side walls of the nose which swell and can obstruct the nose in patients with sinus and allergy problems.  Their size can be surgically reduced to help relieve nasal obstruction. ° °What Can Sinus Surgery Do For Me? ° Sinus surgery can reduce the frequency of sinus infections requiring antibiotic treatment.  This can provide improvement in nasal congestion, post-nasal drainage, facial pressure and nasal obstruction.  Surgery will NOT prevent you from ever having an infection again, so it usually only for patients who get infections 4 or more times yearly requiring antibiotics, or for infections that do not clear with antibiotics.  It will not cure nasal allergies, so patients with allergies may still require medication to treat their allergies after surgery. Surgery may improve headaches related to sinusitis, however, some people will continue to  require medication to control sinus headaches related to allergies.  Surgery will do nothing for other forms of headache (migraine, tension or cluster). ° °What Are the Risks of Endoscopic Sinus Surgery? ° Current techniques allow surgery to be performed safely with little risk, however, there are rare complications that patients should be aware of.  Because the sinuses are located around the eyes, there is risk of eye injury, including blindness, though again, this would be quite rare. This is usually a result of bleeding behind the eye during surgery, which puts the vision oat risk, though there are treatments to protect the vision and prevent permanent disrupted by surgery causing a leak of the spinal fluid that surrounds the brain.  More serious complications would include bleeding inside the brain cavity or damage to the brain.  Again, all of these complications are uncommon, and spinal fluid leaks can be safely managed surgically if they occur.  The most common complication of sinus surgery is bleeding from the nose, which may require packing or cauterization of the nose.  Continued sinus have polyps may experience recurrence of the polyps requiring revision surgery.  Alterations of sense of smell or injury to the tear ducts are also rare complications.  ° °What is the Surgery Like, and what is the Recovery? ° The Surgery usually takes a couple of hours to perform, and is usually performed under a general anesthetic (completely asleep).  Patients are usually discharged home after a couple of hours.  Sometimes during surgery it is necessary to pack the nose to control bleeding, and the packing is left in place for 24 - 48 hours, and removed by your surgeon.    If a septoplasty was performed during the procedure, there is often a splint placed which must be removed after 5-7 days.   °Discomfort: Pain is usually mild to moderate, and can be controlled by prescription pain medication or acetaminophen (Tylenol).   Aspirin, Ibuprofen (Advil, Motrin), or Naprosyn (Aleve) should be avoided, as they can cause increased bleeding.  Most patients feel sinus pressure like they have a bad head cold for several days.  Sleeping with your head elevated can help reduce swelling and facial pressure, as can ice packs over the face.  A humidifier may be helpful to keep the mucous and blood from drying in the nose.  ° °Diet: There are no specific diet restrictions, however, you should generally start with clear liquids and a light diet of bland foods because the anesthetic can cause some nausea.  Advance your diet depending on how your stomach feels.  Taking your pain medication with food will often help reduce stomach upset which pain medications can cause. ° °Nasal Saline Irrigation: It is important to remove blood clots and dried mucous from the nose as it is healing.  This is done by having you irrigate the nose at least 3 - 4 times daily with a salt water solution.  We recommend using NeilMed Sinus Rinse (available at the drug store).  Fill the squeeze bottle with the solution, bend over a sink, and insert the tip of the squeeze bottle into the nose ½ of an inch.  Point the tip of the squeeze bottle towards the inside corner of the eye on the same side your irrigating.  Squeeze the bottle and gently irrigate the nose.  If you bend forward as you do this, most of the fluid will flow back out of the nose, instead of down your throat.   The solution should be warm, near body temperature, when you irrigate.   Each time you irrigate, you should use a full squeeze bottle.  ° °Note that if you are instructed to use Nasal Steroid Sprays at any time after your surgery, irrigate with saline BEFORE using the steroid spray, so you do not wash it all out of the nose. °Another product, Nasal Saline Gel (such as AYR Nasal Saline Gel) can be applied in each nostril 3 - 4 times daily to moisture the nose and reduce scabbing or crusting. ° °Bleeding:   Bloody drainage from the nose can be expected for several days, and patients are instructed to irrigate their nose frequently with salt water to help remove mucous and blood clots.  The drainage may be dark red or brown, though some fresh blood may be seen intermittently, especially after irrigation.  Do not blow you nose, as bleeding may occur. If you must sneeze, keep your mouth open to allow air to escape through your mouth. ° °If heavy bleeding occurs: Irrigate the nose with saline to rinse out clots, then spray the nose 3 - 4 times with Afrin Nasal Decongestant Spray.  The spray will constrict the blood vessels to slow bleeding.  Pinch the lower half of your nose shut to apply pressure, and lay down with your head elevated.  Ice packs over the nose may help as well. If bleeding persists despite these measures, you should notify your doctor.  Do not use the Afrin routinely to control nasal congestion after surgery, as it can result in worsening congestion and may affect healing.  ° ° ° °Activity: Return to work varies among patients. Most patients will be   out of work at least 5 - 7 days to recover.  Patient may return to work after they are off of narcotic pain medication, and feeling well enough to perform the functions of their job.  Patients must avoid heavy lifting (over 10 pounds) or strenuous physical for 2 weeks after surgery, so your employer may need to assign you to light duty, or keep you out of work longer if light duty is not possible.  NOTE: you should not drive, operate dangerous machinery, do any mentally demanding tasks or make any important legal or financial decisions while on narcotic pain medication and recovering from the general anesthetic.  °  °Call Your Doctor Immediately if You Have Any of the Following: °1. Bleeding that you cannot control with the above measures °2. Loss of vision, double vision, bulging of the eye or black eyes. °3. Fever over 101 degrees °4. Neck stiffness with  severe headache, fever, nausea and change in mental state. °You are always encourage to call anytime with concerns, however, please call with requests for pain medication refills during office hours. ° °Office Endoscopy: During follow-up visits your doctor will remove any packing or splints that may have been placed and evaluate and clean your sinuses endoscopically.  Topical anesthetic will be used to make this as comfortable as possible, though you may want to take your pain medication prior to the visit.  How often this will need to be done varies from patient to patient.  After complete recovery from the surgery, you may need follow-up endoscopy from time to time, particularly if there is concern of recurrent infection or nasal polyps. ° °General Anesthesia, Care After °Refer to this sheet in the next few weeks. These instructions provide you with information on caring for yourself after your procedure. Your health care provider may also give you more specific instructions. Your treatment has been planned according to current medical practices, but problems sometimes occur. Call your health care provider if you have any problems or questions after your procedure. °WHAT TO EXPECT AFTER THE PROCEDURE °After the procedure, it is typical to experience: °· Sleepiness. °· Nausea and vomiting. °HOME CARE INSTRUCTIONS °· For the first 24 hours after general anesthesia: °¨ Have a responsible person with you. °¨ Do not drive a car. If you are alone, do not take public transportation. °¨ Do not drink alcohol. °¨ Do not take medicine that has not been prescribed by your health care provider. °¨ Do not sign important papers or make important decisions. °¨ You may resume a normal diet and activities as directed by your health care provider. °· Change bandages (dressings) as directed. °· If you have questions or problems that seem related to general anesthesia, call the hospital and ask for the anesthetist or anesthesiologist  on call. °SEEK MEDICAL CARE IF: °· You have nausea and vomiting that continue the day after anesthesia. °· You develop a rash. °SEEK IMMEDIATE MEDICAL CARE IF:  °· You have difficulty breathing. °· You have chest pain. °· You have any allergic problems. °Document Released: 10/22/2000 Document Revised: 07/21/2013 Document Reviewed: 01/29/2013 °[employer]® Patient Information ©2015 [employer]. This information is not intended to replace advice given to you by your health care provider. Make sure you discuss any questions you have with your health care provider. ° °

## 2015-04-07 ENCOUNTER — Encounter
Admission: RE | Disposition: A | Discharge status: Home / Self Care | Payer: Self-pay | Source: Ambulatory Visit | Attending: Otolaryngology

## 2015-04-07 ENCOUNTER — Ambulatory Visit: Payer: PPO | Admitting: Anesthesiology

## 2015-04-07 ENCOUNTER — Ambulatory Visit
Admission: RE | Admit: 2015-04-07 | Discharge: 2015-04-07 | Disposition: A | Discharge status: Home / Self Care | Payer: PPO | Source: Ambulatory Visit | Attending: Otolaryngology | Admitting: Otolaryngology

## 2015-04-07 DIAGNOSIS — J321 Chronic frontal sinusitis: Secondary | ICD-10-CM | POA: Insufficient documentation

## 2015-04-07 DIAGNOSIS — F17210 Nicotine dependence, cigarettes, uncomplicated: Secondary | ICD-10-CM | POA: Insufficient documentation

## 2015-04-07 DIAGNOSIS — K589 Irritable bowel syndrome without diarrhea: Secondary | ICD-10-CM | POA: Diagnosis not present

## 2015-04-07 DIAGNOSIS — J320 Chronic maxillary sinusitis: Secondary | ICD-10-CM | POA: Diagnosis not present

## 2015-04-07 DIAGNOSIS — F419 Anxiety disorder, unspecified: Secondary | ICD-10-CM | POA: Insufficient documentation

## 2015-04-07 DIAGNOSIS — J322 Chronic ethmoidal sinusitis: Secondary | ICD-10-CM | POA: Diagnosis not present

## 2015-04-07 DIAGNOSIS — M1990 Unspecified osteoarthritis, unspecified site: Secondary | ICD-10-CM | POA: Insufficient documentation

## 2015-04-07 DIAGNOSIS — Z90710 Acquired absence of both cervix and uterus: Secondary | ICD-10-CM | POA: Diagnosis not present

## 2015-04-07 DIAGNOSIS — Z6837 Body mass index (BMI) 37.0-37.9, adult: Secondary | ICD-10-CM | POA: Diagnosis not present

## 2015-04-07 DIAGNOSIS — J342 Deviated nasal septum: Secondary | ICD-10-CM | POA: Insufficient documentation

## 2015-04-07 DIAGNOSIS — J45909 Unspecified asthma, uncomplicated: Secondary | ICD-10-CM | POA: Insufficient documentation

## 2015-04-07 DIAGNOSIS — G5790 Unspecified mononeuropathy of unspecified lower limb: Secondary | ICD-10-CM | POA: Insufficient documentation

## 2015-04-07 DIAGNOSIS — K219 Gastro-esophageal reflux disease without esophagitis: Secondary | ICD-10-CM | POA: Diagnosis not present

## 2015-04-07 HISTORY — PX: SEPTOPLASTY: SHX2393

## 2015-04-07 HISTORY — DX: Anemia, unspecified: D64.9

## 2015-04-07 HISTORY — PX: FRONTAL SINUS EXPLORATION: SHX6591

## 2015-04-07 HISTORY — PX: IMAGE GUIDED SINUS SURGERY: SHX6570

## 2015-04-07 HISTORY — DX: Unspecified osteoarthritis, unspecified site: M19.90

## 2015-04-07 HISTORY — DX: Myoneural disorder, unspecified: G70.9

## 2015-04-07 HISTORY — DX: Bronchitis, not specified as acute or chronic: J40

## 2015-04-07 HISTORY — PX: MAXILLARY ANTROSTOMY: SHX2003

## 2015-04-07 HISTORY — PX: ETHMOIDECTOMY: SHX5197

## 2015-04-07 HISTORY — DX: Anxiety disorder, unspecified: F41.9

## 2015-04-07 SURGERY — SINUS SURGERY, WITH IMAGING GUIDANCE
Anesthesia: General | Wound class: Clean Contaminated

## 2015-04-07 MED ORDER — ONDANSETRON HCL 4 MG/2ML IJ SOLN
INTRAMUSCULAR | Status: DC | PRN
  Administered 2015-04-07: 4 mg via INTRAVENOUS

## 2015-04-07 MED ORDER — LIDOCAINE-EPINEPHRINE 1 %-1:100000 IJ SOLN
INTRAMUSCULAR | Status: DC | PRN
  Administered 2015-04-07: 6 mL

## 2015-04-07 MED ORDER — PROMETHAZINE HCL 25 MG/ML IJ SOLN
12.5000 mg | Freq: Once | INTRAMUSCULAR | Status: AC | PRN
  Administered 2015-04-07: 6.25 mg via INTRAVENOUS

## 2015-04-07 MED ORDER — OXYCODONE HCL 5 MG/5ML PO SOLN
5.0000 mg | Freq: Once | ORAL | Status: AC
Start: 2015-04-07 — End: 2015-04-07
  Administered 2015-04-07: 5 mg via ORAL

## 2015-04-07 MED ORDER — PROPOFOL 10 MG/ML IV BOLUS
INTRAVENOUS | Status: DC | PRN
  Administered 2015-04-07: 200 mg via INTRAVENOUS

## 2015-04-07 MED ORDER — HYDROMORPHONE HCL 1 MG/ML IJ SOLN
0.2500 mg | INTRAMUSCULAR | Status: DC | PRN
  Administered 2015-04-07 (×2): 0.25 mg via INTRAVENOUS

## 2015-04-07 MED ORDER — FENTANYL CITRATE (PF) 100 MCG/2ML IJ SOLN
INTRAMUSCULAR | Status: DC | PRN
  Administered 2015-04-07: 150 ug via INTRAVENOUS
  Administered 2015-04-07 (×3): 50 ug via INTRAVENOUS

## 2015-04-07 MED ORDER — GLYCOPYRROLATE 0.2 MG/ML IJ SOLN
INTRAMUSCULAR | Status: DC | PRN
  Administered 2015-04-07: 0.1 mg via INTRAVENOUS

## 2015-04-07 MED ORDER — PHENYLEPHRINE HCL 0.5 % NA SOLN
NASAL | Status: DC | PRN
  Administered 2015-04-07: 30 mL via TOPICAL

## 2015-04-07 MED ORDER — ACETAMINOPHEN 10 MG/ML IV SOLN
1000.0000 mg | Freq: Once | INTRAVENOUS | Status: AC
  Administered 2015-04-07: 1000 mg via INTRAVENOUS

## 2015-04-07 MED ORDER — METOCLOPRAMIDE HCL 5 MG/ML IJ SOLN
INTRAMUSCULAR | Status: DC | PRN
  Administered 2015-04-07: 10 mg via INTRAVENOUS

## 2015-04-07 MED ORDER — LIDOCAINE HCL (CARDIAC) 20 MG/ML IV SOLN
INTRAVENOUS | Status: DC | PRN
  Administered 2015-04-07: 50 mg via INTRAVENOUS

## 2015-04-07 MED ORDER — ROCURONIUM BROMIDE 100 MG/10ML IV SOLN
INTRAVENOUS | Status: DC | PRN
  Administered 2015-04-07: 30 mg via INTRAVENOUS
  Administered 2015-04-07: 10 mg via INTRAVENOUS

## 2015-04-07 MED ORDER — DEXAMETHASONE SODIUM PHOSPHATE 4 MG/ML IJ SOLN
INTRAMUSCULAR | Status: DC | PRN
  Administered 2015-04-07: 10 mg via INTRAVENOUS

## 2015-04-07 MED ORDER — OXYMETAZOLINE HCL 0.05 % NA SOLN
2.0000 | Freq: Once | NASAL | Status: AC
  Administered 2015-04-07: 2 via NASAL

## 2015-04-07 MED ORDER — LACTATED RINGERS IV SOLN
INTRAVENOUS | Status: DC
  Administered 2015-04-07: 09:00:00 via INTRAVENOUS

## 2015-04-07 MED ORDER — MIDAZOLAM HCL 5 MG/5ML IJ SOLN
INTRAMUSCULAR | Status: DC | PRN
  Administered 2015-04-07: 2 mg via INTRAVENOUS

## 2015-04-07 MED ORDER — ALBUTEROL SULFATE HFA 108 (90 BASE) MCG/ACT IN AERS
INHALATION_SPRAY | RESPIRATORY_TRACT | Status: DC | PRN
  Administered 2015-04-07: 4 via RESPIRATORY_TRACT

## 2015-04-07 SURGICAL SUPPLY — 47 items
BALLOON SINUPLASTY SYSTEM (BALLOONS) ×2 IMPLANT
BATTERY INSTRU NAVIGATION (MISCELLANEOUS) ×16 IMPLANT
BLADE SURG 15 STRL LF DISP TIS (BLADE) IMPLANT
BLADE SURG 15 STRL SS (BLADE)
BTRY SRG DRVR LF (MISCELLANEOUS) ×8
CANISTER SUCT 1200ML W/VALVE (MISCELLANEOUS) ×4 IMPLANT
CATH IV 18X1 1/4 SAFELET (CATHETERS) ×4 IMPLANT
COAG SUCT 10F 3.5MM HAND CTRL (MISCELLANEOUS) ×2 IMPLANT
COAGULATOR SUCT 8FR VV (MISCELLANEOUS) ×2 IMPLANT
DEVICE INFLATION SEID (MISCELLANEOUS) ×2 IMPLANT
DRAPE HEAD BAR (DRAPES) ×4 IMPLANT
DRESSING ALLEVYN 6X6 (MISCELLANEOUS) ×4 IMPLANT
DRESSING NASL FOAM PST OP SINU (MISCELLANEOUS) IMPLANT
DRSG NASAL 4CM NASOPORE (MISCELLANEOUS) IMPLANT
DRSG NASAL FOAM POST OP SINU (MISCELLANEOUS)
GLOVE PI ULTRA LF STRL 7.5 (GLOVE) ×4 IMPLANT
GLOVE PI ULTRA NON LATEX 7.5 (GLOVE) ×6
IRRIGATOR 4MM STR (IRRIGATION / IRRIGATOR) ×4 IMPLANT
IV CATH 18X1 1/4 SAFELET (CATHETERS) ×2
IV NS 500ML (IV SOLUTION) ×4
IV NS 500ML BAXH (IV SOLUTION) ×2 IMPLANT
NAVIGATION MASK REG  ST (MISCELLANEOUS) ×4 IMPLANT
NDL HYPO 25GX1X1/2 BEV (NEEDLE) ×2 IMPLANT
NDL SPNL 25GX3.5 QUINCKE BL (NEEDLE) IMPLANT
NEEDLE HYPO 25GX1X1/2 BEV (NEEDLE) ×4 IMPLANT
NEEDLE SPNL 25GX3.5 QUINCKE BL (NEEDLE) ×4 IMPLANT
NS IRRIG 500ML POUR BTL (IV SOLUTION) ×4 IMPLANT
PACK DRAPE NASAL/ENT (PACKS) ×4 IMPLANT
PACKING NASAL EPIS 4X2.4 XEROG (MISCELLANEOUS) IMPLANT
PAD GROUND ADULT SPLIT (MISCELLANEOUS) ×4 IMPLANT
PATTIES SURGICAL .5 X3 (DISPOSABLE) ×4 IMPLANT
SET HANDPIECE IRR DIEGO (MISCELLANEOUS) ×4 IMPLANT
SOL ANTI-FOG 6CC FOG-OUT (MISCELLANEOUS) ×2 IMPLANT
SOL FOG-OUT ANTI-FOG 6CC (MISCELLANEOUS) ×2
SPLINT NASAL SEPTAL BLV .50 ST (MISCELLANEOUS) ×4 IMPLANT
STRAP BODY AND KNEE 60X3 (MISCELLANEOUS) ×6 IMPLANT
SUT CHROMIC 3-0 (SUTURE) ×4
SUT CHROMIC 3-0 KS 27XMFL CR (SUTURE) ×2
SUT ETHILON 3-0 KS 30 BLK (SUTURE) ×4 IMPLANT
SUT ETHILON 4-0 (SUTURE)
SUT ETHILON 4-0 FS2 18XMFL BLK (SUTURE)
SUT PLAIN GUT 4-0 (SUTURE) ×4 IMPLANT
SUTURE CHRMC 3-0 KS 27XMFL CR (SUTURE) ×2 IMPLANT
SUTURE ETHLN 4-0 FS2 18XMF BLK (SUTURE) IMPLANT
SYR 3ML LL SCALE MARK (SYRINGE) ×4 IMPLANT
TOWEL OR 17X26 4PK STRL BLUE (TOWEL DISPOSABLE) ×4 IMPLANT
WATER STERILE IRR 500ML POUR (IV SOLUTION) ×4 IMPLANT

## 2015-04-07 NOTE — H&P (Signed)
  H&P has been reviewed and no changes necessary. To be downloaded later. 

## 2015-04-07 NOTE — Anesthesia Preprocedure Evaluation (Signed)
Anesthesia Evaluation  Patient identified by MRN, date of birth, ID band  Reviewed: NPO status   History of Anesthesia Complications Negative for: history of anesthetic complications  Airway Mallampati: II  TM Distance: >3 FB Neck ROM: full    Dental no notable dental hx.    Pulmonary asthma , Current Smoker,    Pulmonary exam normal        Cardiovascular Exercise Tolerance: Good negative cardio ROS Normal cardiovascular exam     Neuro/Psych  Headaches, Anxiety Neuropathy legs  negative psych ROS   GI/Hepatic Neg liver ROS, GERD  Controlled,IBS   Endo/Other  Morbid obesity  Renal/GU negative Renal ROS  negative genitourinary   Musculoskeletal  (+) Arthritis , Back pain   Abdominal   Peds  Hematology negative hematology ROS (+)   Anesthesia Other Findings   Reproductive/Obstetrics negative OB ROS hysterectomy                             Anesthesia Physical Anesthesia Plan  ASA: II  Anesthesia Plan: General ETT   Post-op Pain Management:    Induction:   Airway Management Planned:   Additional Equipment:   Intra-op Plan:   Post-operative Plan:   Informed Consent: I have reviewed the patients History and Physical, chart, labs and discussed the procedure including the risks, benefits and alternatives for the proposed anesthesia with the patient or authorized representative who has indicated his/her understanding and acceptance.     Plan Discussed with: CRNA  Anesthesia Plan Comments:         Anesthesia Quick Evaluation

## 2015-04-07 NOTE — Anesthesia Procedure Notes (Signed)
Procedure Name: Intubation Date/Time: 04/07/2015 8:55 AM Performed by: Jimmy Picket Pre-anesthesia Checklist: Patient identified, Emergency Drugs available, Suction available, Patient being monitored and Timeout performed Patient Re-evaluated:Patient Re-evaluated prior to inductionOxygen Delivery Method: Circle system utilized Preoxygenation: Pre-oxygenation with 100% oxygen Intubation Type: IV induction Ventilation: Mask ventilation without difficulty Laryngoscope Size: Miller and 2 Grade View: Grade I Tube type: Oral Rae Tube size: 7.0 mm Number of attempts: 1 Placement Confirmation: ETT inserted through vocal cords under direct vision,  positive ETCO2 and breath sounds checked- equal and bilateral Tube secured with: Tape Dental Injury: Teeth and Oropharynx as per pre-operative assessment

## 2015-04-07 NOTE — Op Note (Signed)
04/07/2015  11:06 AM    [name]  [id number]   Pre-Op Dx:  Septal deviation, bilateral chronic ethmoid sinusitis, bilateral chronic frontal sinusitis, bilateral chronic maxillary sinusitis  Post-op Dx: Same  Proc: Septoplasty, bilateral endoscopic total ethmoidectomy, bilateral endoscopic maxillary antrostomy, bilateral endoscopic frontal sinusotomies, use of image guided system   Surg:  Damaria Vachon H  Anes:  GOT  EBL:  150 mL  Comp:  None  Findings:  Chronically inflamed mucous membranes and the sinuses along with very thickened boned in the ethmoid sinuses bilaterally  Procedure: The patient was brought to the operating suite and placed in supine position. She was given general anesthesia by oral endotracheal intubation. The nose was prepped using 4% Xylocaine with phenylephrine on cotton pledgets in the nose. 4 mL of 1% Xylocaine with epi 1-100,000 was used for injection into the septum and vasoconstriction. The image guided system was brought in and the CT scan was downloaded from the disc. The template was applied to the face. The template was registered to the system. It was 0.5 mm of variance. The suction instruments were registered to the system and the show perfect alignment with the CT scan. She was prepped and draped sterile fashion.  A left Killian incision was created for elevation of the mucoperiosteum on the left side of the septum. The mucoperiosteum was elevated back to the bony cartilaginous junction which was split. The mucoperiosteum was elevated off both sides the ethmoid plate and vomer and along the maxillary crest. The ethmoid plate and vomer were buckled to the right side posteriorly and these were trimmed. The maxillary crest was overhanging on the right side and this was trimmed using a chisel to free up some of the crooked bone. There was a tear mucosa inferiorly along the right side. Some of the posterior and inferior quadrangular plate was trimmed to  allow it to slide back towards the middle. The flaps were placed back in their anatomic position and this left a larger airway especially on the right side. 40 plain gut suture on a mini Keith needle was used in a through and through whip stitch for closure of the mucosal flaps. This was used to close the [place] incision as well.  The 0 scope was then used left side for visualizing the nasal airway the image guided system was used for evaluating landmarks and depth of dissection the middle turbinate was infractured and the middle meatus was better visualized. The side biter was used to incise the uncinate process and then this was removed using the 45 through biting forceps and the [facility] microdebrider. The natural ostium was seen and widened to create a little bigger opening into the maxillary antrum. Once the maxillary sinus was widely opened the posterior middle ethmoid air cells were opened as well the [facility] Cam system was used for trimming the edges and the microdebrider was used to evaluate the depth to make sure all the air cells were opened. The Acclarent frontal sinus balloon set was then used to cannulize the frontal sinus duct which was quite small. He could see the light in the for head. The balloon was threaded over this and the duct was dilated to 12 cm of pressure. When it was removed the 30 scope was used for tracking up the frontal sinus the party wall between the frontal sinus and auger nasi cell was then removed to widen the opening to the frontal sinus duct. You could now easily see into the frontal sinus. The rest  of the anterior ethmoid air cells were opened up using the image guided system to make sure that this was cleaned out. A cottonoid pledget was placed or temporarily while the right side was then addressed.  The 0 scope was used again to visualize the right nasal airway of the middle turbinate had a lot of loose tissue in its anterior border and some of this was trimmed as it was in  the way and locking some of the view. The middle turbinate was infractured. The 0 scope was used to visualize the middle meatus. The uncinate process was incised and removed using the 45 forceps again with the [facility] microdebrider. The maxillary antrum was visible and this was widened. There is a large Haller's cell on the anterior wall that was removed as it was blocking some of the opening. This was very thick bone here around the [facility] cell. The posterior middle ethmoid air cells were opened and dissected deeply to make sure all of the air cells were opened. The image guided system was used to make sure of the depth of dissection. The a Clarence balloon system was used again to cannulize the frontal sinus duct. Again it was very tight to get up into the frontal sinus. I had to dilate somewhat to open up the bottom and then thread the balloon farther to open up the upper portion of the duct. As able get the duct widened so I could visualize it well. With the 30 scopes and then removed some of the party wall from the sinus into the auger nausea cell to widen the opening to the frontal sinus. You could see the hole easily put the suction instruments right up into the frontal sinus. Had 6 mm size opening or larger. The rest the anterior ethmoid air cells were opened with third-degree scope and and the frontal sinus through biting instruments. Right side was revisualized and there is no further sinus disease that I could see and all the air cells were opened.  Sides were revisited looked no further disease was found xerogel was placed the opening of the the frontal and and ethmoid sinuses and wetted a piece was placed along the middle turbinate were was trimmed as well. Xomed 0.5 regular sized splints were then trimmed and placed along both sides the septum and held in position with a 3-0 nylon through and through suture.  The patient tolerated the procedure well. She was awakened and taken to the recovery room  in satisfactory condition. There were no operative complications.  Dispo:   To PACU to be discharged home  Plan:  To follow-up in the office in 6 days. She will rest at home for the next 3 days. She will used tramadol  for pain. She will return to office sooner if she has problems.  Aum Caggiano H  04/07/2015 11:06 AM

## 2015-04-07 NOTE — Anesthesia Postprocedure Evaluation (Signed)
  Anesthesia Post-op Note  Patient: [name]  Procedure(s) Performed: Procedure(s) with comments: IMAGE GUIDED SINUS SURGERY (N/A) - GAVE DISK TO BRENDA FRONTAL SINUSOTOMIES (Bilateral) SEPTOPLASTY (N/A)  TOTAL ETHMOIDECTOMIES (Bilateral) MAXILLARY ANTROSTOMIES (Bilateral)  Anesthesia type:General ETT  Patient location: PACU  Post pain: Pain level controlled  Post assessment: Post-op Vital signs reviewed, Patient's Cardiovascular Status Stable, Respiratory Function Stable, Patent Airway and No signs of Nausea or vomiting  Post vital signs: Reviewed and stable  Last Vitals:  Filed Vitals:   04/07/15 1244  BP:   Pulse: 85  Temp:   Resp: 14    Level of consciousness: awake, alert  and patient cooperative  Complications: No apparent anesthesia complications

## 2015-04-07 NOTE — Transfer of Care (Signed)
Immediate Anesthesia Transfer of Care Note  Patient: [name]  Procedure(s) Performed: Procedure(s) with comments: IMAGE GUIDED SINUS SURGERY (N/A) - GAVE DISK TO BRENDA FRONTAL SINUSOTOMIES (Bilateral) SEPTOPLASTY (N/A)  TOTAL ETHMOIDECTOMIES (Bilateral) MAXILLARY ANTROSTOMIES (Bilateral)  Patient Location: PACU  Anesthesia Type: General ETT  Level of Consciousness: awake, alert  and patient cooperative  Airway and Oxygen Therapy: Patient Spontanous Breathing and Patient connected to supplemental oxygen  Post-op Assessment: Post-op Vital signs reviewed, Patient's Cardiovascular Status Stable, Respiratory Function Stable, Patent Airway and No signs of Nausea or vomiting  Post-op Vital Signs: Reviewed and stable  Complications: No apparent anesthesia complications

## 2015-04-08 ENCOUNTER — Encounter: Payer: Self-pay | Admitting: Otolaryngology

## 2015-04-11 LAB — SURGICAL PATHOLOGY

## 2015-08-04 DIAGNOSIS — M5136 Other intervertebral disc degeneration, lumbar region: Secondary | ICD-10-CM | POA: Diagnosis not present

## 2015-08-04 DIAGNOSIS — M542 Cervicalgia: Secondary | ICD-10-CM | POA: Diagnosis not present

## 2015-08-19 ENCOUNTER — Other Ambulatory Visit: Payer: Self-pay | Admitting: Neurosurgery

## 2015-08-19 DIAGNOSIS — M5136 Other intervertebral disc degeneration, lumbar region: Secondary | ICD-10-CM

## 2015-08-24 DIAGNOSIS — E669 Obesity, unspecified: Secondary | ICD-10-CM | POA: Diagnosis not present

## 2015-08-24 DIAGNOSIS — F419 Anxiety disorder, unspecified: Secondary | ICD-10-CM | POA: Diagnosis not present

## 2015-08-29 ENCOUNTER — Ambulatory Visit
Admission: RE | Admit: 2015-08-29 | Discharge: 2015-08-29 | Disposition: A | Discharge status: Home / Self Care | Payer: PPO | Source: Ambulatory Visit | Attending: Neurosurgery | Admitting: Neurosurgery

## 2015-08-29 DIAGNOSIS — M5136 Other intervertebral disc degeneration, lumbar region: Secondary | ICD-10-CM

## 2015-08-29 DIAGNOSIS — M4806 Spinal stenosis, lumbar region: Secondary | ICD-10-CM | POA: Diagnosis not present

## 2015-08-29 MED ORDER — GADOBENATE DIMEGLUMINE 529 MG/ML IV SOLN
20.0000 mL | Freq: Once | INTRAVENOUS | Status: AC | PRN
  Administered 2015-08-29: 20 mL via INTRAVENOUS

## 2015-09-01 DIAGNOSIS — M5417 Radiculopathy, lumbosacral region: Secondary | ICD-10-CM | POA: Diagnosis not present

## 2015-09-01 DIAGNOSIS — M5136 Other intervertebral disc degeneration, lumbar region: Secondary | ICD-10-CM | POA: Diagnosis not present

## 2015-09-01 DIAGNOSIS — M542 Cervicalgia: Secondary | ICD-10-CM | POA: Diagnosis not present

## 2015-09-27 DIAGNOSIS — E669 Obesity, unspecified: Secondary | ICD-10-CM | POA: Diagnosis not present

## 2015-12-21 ENCOUNTER — Other Ambulatory Visit: Payer: Self-pay

## 2015-12-21 MED ORDER — DEXLANSOPRAZOLE 60 MG PO CPDR: 60.0000 mg | DELAYED_RELEASE_CAPSULE | Freq: Every day | ORAL | Status: DC

## 2015-12-22 DIAGNOSIS — M940 Chondrocostal junction syndrome [Tietze]: Secondary | ICD-10-CM | POA: Diagnosis not present

## 2016-03-08 DIAGNOSIS — R1012 Left upper quadrant pain: Secondary | ICD-10-CM | POA: Diagnosis not present

## 2016-03-14 DIAGNOSIS — E669 Obesity, unspecified: Secondary | ICD-10-CM | POA: Diagnosis not present

## 2016-03-14 DIAGNOSIS — E119 Type 2 diabetes mellitus without complications: Secondary | ICD-10-CM | POA: Insufficient documentation

## 2016-03-14 DIAGNOSIS — Z0000 Encounter for general adult medical examination without abnormal findings: Secondary | ICD-10-CM | POA: Diagnosis not present

## 2016-03-14 DIAGNOSIS — R1012 Left upper quadrant pain: Secondary | ICD-10-CM | POA: Diagnosis not present

## 2016-03-14 DIAGNOSIS — J029 Acute pharyngitis, unspecified: Secondary | ICD-10-CM | POA: Diagnosis not present

## 2016-03-14 DIAGNOSIS — D72825 Bandemia: Secondary | ICD-10-CM | POA: Insufficient documentation

## 2016-03-14 DIAGNOSIS — Z720 Tobacco use: Secondary | ICD-10-CM | POA: Diagnosis not present

## 2016-03-14 DIAGNOSIS — F419 Anxiety disorder, unspecified: Secondary | ICD-10-CM | POA: Diagnosis not present

## 2016-03-14 DIAGNOSIS — R7309 Other abnormal glucose: Secondary | ICD-10-CM | POA: Diagnosis not present

## 2016-03-21 DIAGNOSIS — R1012 Left upper quadrant pain: Secondary | ICD-10-CM | POA: Diagnosis not present

## 2016-04-09 DIAGNOSIS — E669 Obesity, unspecified: Secondary | ICD-10-CM | POA: Insufficient documentation

## 2016-04-19 ENCOUNTER — Other Ambulatory Visit: Payer: Self-pay | Admitting: Family Medicine

## 2016-04-19 DIAGNOSIS — Z1231 Encounter for screening mammogram for malignant neoplasm of breast: Secondary | ICD-10-CM

## 2016-04-23 DIAGNOSIS — R6889 Other general symptoms and signs: Secondary | ICD-10-CM | POA: Diagnosis not present

## 2016-04-23 DIAGNOSIS — R51 Headache: Secondary | ICD-10-CM | POA: Diagnosis not present

## 2016-04-26 ENCOUNTER — Other Ambulatory Visit: Payer: Self-pay | Admitting: Gastroenterology

## 2016-04-26 ENCOUNTER — Other Ambulatory Visit: Payer: Self-pay

## 2016-04-26 ENCOUNTER — Encounter: Payer: Self-pay | Admitting: Gastroenterology

## 2016-04-26 ENCOUNTER — Ambulatory Visit (INDEPENDENT_AMBULATORY_CARE_PROVIDER_SITE_OTHER): Payer: PPO | Admitting: Gastroenterology

## 2016-04-26 VITALS — BP 117/67 | HR 88 | Temp 98.3°F | Ht 67.0 in | Wt 251.0 lb

## 2016-04-26 DIAGNOSIS — K760 Fatty (change of) liver, not elsewhere classified: Secondary | ICD-10-CM

## 2016-04-26 DIAGNOSIS — K22719 Barrett's esophagus with dysplasia, unspecified: Secondary | ICD-10-CM | POA: Diagnosis not present

## 2016-04-26 MED ORDER — DEXLANSOPRAZOLE 60 MG PO CPDR: 60.0000 mg | DELAYED_RELEASE_CAPSULE | Freq: Every day | ORAL | 11 refills | Status: DC

## 2016-04-26 NOTE — Progress Notes (Signed)
Primary Care Physician: WHITE, Arlyss Repress, NP  Primary Gastroenterologist:  Dr. Midge Minium  Chief Complaint  Patient presents with  . Fatty liver  . Gastroesophageal Reflux    HPI: [name] is a 40 y.o. female here For fatty liver. The patient has had a ultrasound showing fatty liver. The patient also has normal liver enzymes chronically. The patient now reports that she is due for her upper endoscopy due to her history of Barrett's esophagus. There is no report of any unexplained weight loss although the patient has been on a diet and lost 15 pounds. The patient also reports that she is being evaluated for gastric bypass surgery. She thinks she is going to have the sleeve gastrectomy.  Current Outpatient Prescriptions  Medication Sig Dispense Refill  . albuterol (PROVENTIL HFA;VENTOLIN HFA) 108 (90 BASE) MCG/ACT inhaler Inhale into the lungs.    Marland Kitchen azithromycin (ZITHROMAX) 250 MG tablet Take 2 tablets (500mg ) by mouth on Day 1. Take 1 tablet (250mg ) by mouth on Days 2-5.    . chlorpheniramine-HYDROcodone (TUSSIONEX) 10-8 MG/5ML SUER Take by mouth.    . clonazePAM (KLONOPIN) 0.5 MG tablet Take 0.5 mg by mouth 2 (two) times daily.    Marland Kitchen dexlansoprazole (DEXILANT) 60 MG capsule Take 1 capsule (60 mg total) by mouth daily. 30 capsule 11  . fluticasone-salmeterol (ADVAIR HFA) 115-21 MCG/ACT inhaler Inhale 2 puffs into the lungs 2 (two) times daily.    . predniSONE (DELTASONE) 20 MG tablet Take by mouth.    Marland Kitchen tiZANidine (ZANAFLEX) 4 MG tablet Take by mouth.    Marland Kitchen albuterol (PROVENTIL) (2.5 MG/3ML) 0.083% nebulizer solution Inhale into the lungs.    . montelukast (SINGULAIR) 10 MG tablet Take by mouth.    Marland Kitchen omeprazole (PRILOSEC) 40 MG capsule Take by mouth.     No current facility-administered medications for this visit.     Allergies as of 04/26/2016 - Review Complete 04/26/2016  Allergen Reaction Noted  . Penicillins Anaphylaxis 08/02/2009  . Oxycodone Itching and Nausea  Only 12/31/2014  . Prednisone  12/31/2014  . Quinolones  12/31/2014  . Latex Rash 12/31/2014    ROS:  General: Negative for anorexia, weight loss, fever, chills, fatigue, weakness. ENT: Negative for hoarseness, difficulty swallowing , nasal congestion. CV: Negative for chest pain, angina, palpitations, dyspnea on exertion, peripheral edema.  Respiratory: Negative for dyspnea at rest, dyspnea on exertion, cough, sputum, wheezing.  GI: See history of present illness. GU:  Negative for dysuria, hematuria, urinary incontinence, urinary frequency, nocturnal urination.  Endo: Negative for unusual weight change.    Physical Examination:   BP 117/67   Pulse 88   Temp 98.3 F (36.8 C) (Oral)   Ht 5\' 7"  (1.702 m)   Wt 251 lb (113.9 kg)   BMI 39.31 kg/m   General: Well-nourished, well-developed in no acute distress.  Eyes: No icterus. Conjunctivae pink. Mouth: Oropharyngeal mucosa moist and pink , no lesions erythema or exudate. Lungs: Clear to auscultation bilaterally. Non-labored. Heart: Regular rate and rhythm, no murmurs rubs or gallops.  Abdomen: Bowel sounds are normal, nontender, nondistended, no hepatosplenomegaly or masses, no abdominal bruits or hernia , no rebound or guarding.   Extremities: No lower extremity edema. No clubbing or deformities. Neuro: Alert and oriented x 3.  Grossly intact. Skin: Warm and dry, no jaundice.   Psych: Alert and cooperative, normal mood and affect.  Labs:    Imaging Studies: No results found.  Assessment and Plan:   [name]  is a 40 y.o. y/o female who has a history of obesity and was found to have fatty liver. Despite having fatty liver her liver enzymes are normal indicating that no further workup for her fatty liver is needed. The patient will have less fatty liver when she loses more weight and she states that she is being evaluated now for bariatric surgery. The patient will be set up for an EGD because of her history of  Barrett's esophagus.I have discussed risks & benefits which include, but are not limited to, bleeding, infection, perforation & drug reaction.  The patient agrees with this plan & written consent will be obtained.      Note: This dictation was prepared with Dragon dictation along with smaller phrase technology. Any transcriptional errors that result from this process are unintentional.

## 2016-04-27 ENCOUNTER — Ambulatory Visit
Admission: RE | Admit: 2016-04-27 | Discharge: 2016-04-27 | Disposition: A | Discharge status: Home / Self Care | Payer: PPO | Source: Ambulatory Visit | Attending: Family Medicine | Admitting: Family Medicine

## 2016-04-27 DIAGNOSIS — Z1231 Encounter for screening mammogram for malignant neoplasm of breast: Secondary | ICD-10-CM | POA: Diagnosis not present

## 2016-05-03 ENCOUNTER — Ambulatory Visit: Payer: Self-pay | Admitting: Gastroenterology

## 2016-05-15 NOTE — Discharge Instructions (Signed)

## 2016-05-17 ENCOUNTER — Ambulatory Visit: Payer: PPO | Admitting: Anesthesiology

## 2016-05-17 ENCOUNTER — Encounter
Admission: RE | Disposition: A | Discharge status: Home / Self Care | Payer: Self-pay | Source: Ambulatory Visit | Attending: Gastroenterology

## 2016-05-17 ENCOUNTER — Ambulatory Visit
Admission: RE | Admit: 2016-05-17 | Discharge: 2016-05-17 | Disposition: A | Discharge status: Home / Self Care | Payer: PPO | Source: Ambulatory Visit | Attending: Gastroenterology | Admitting: Gastroenterology

## 2016-05-17 DIAGNOSIS — Z8041 Family history of malignant neoplasm of ovary: Secondary | ICD-10-CM | POA: Insufficient documentation

## 2016-05-17 DIAGNOSIS — Z79899 Other long term (current) drug therapy: Secondary | ICD-10-CM | POA: Diagnosis not present

## 2016-05-17 DIAGNOSIS — M479 Spondylosis, unspecified: Secondary | ICD-10-CM | POA: Insufficient documentation

## 2016-05-17 DIAGNOSIS — Z885 Allergy status to narcotic agent status: Secondary | ICD-10-CM | POA: Diagnosis not present

## 2016-05-17 DIAGNOSIS — K2970 Gastritis, unspecified, without bleeding: Secondary | ICD-10-CM | POA: Diagnosis not present

## 2016-05-17 DIAGNOSIS — Z90710 Acquired absence of both cervix and uterus: Secondary | ICD-10-CM | POA: Insufficient documentation

## 2016-05-17 DIAGNOSIS — F17210 Nicotine dependence, cigarettes, uncomplicated: Secondary | ICD-10-CM | POA: Diagnosis not present

## 2016-05-17 DIAGNOSIS — K2270 Barrett's esophagus without dysplasia: Secondary | ICD-10-CM | POA: Diagnosis not present

## 2016-05-17 DIAGNOSIS — Z880 Allergy status to penicillin: Secondary | ICD-10-CM | POA: Insufficient documentation

## 2016-05-17 DIAGNOSIS — Z91040 Latex allergy status: Secondary | ICD-10-CM | POA: Insufficient documentation

## 2016-05-17 DIAGNOSIS — Z7951 Long term (current) use of inhaled steroids: Secondary | ICD-10-CM | POA: Insufficient documentation

## 2016-05-17 DIAGNOSIS — K219 Gastro-esophageal reflux disease without esophagitis: Secondary | ICD-10-CM | POA: Insufficient documentation

## 2016-05-17 DIAGNOSIS — R7303 Prediabetes: Secondary | ICD-10-CM | POA: Diagnosis not present

## 2016-05-17 DIAGNOSIS — G7089 Other specified myoneural disorders: Secondary | ICD-10-CM | POA: Insufficient documentation

## 2016-05-17 DIAGNOSIS — M170 Bilateral primary osteoarthritis of knee: Secondary | ICD-10-CM | POA: Diagnosis not present

## 2016-05-17 DIAGNOSIS — K228 Other specified diseases of esophagus: Secondary | ICD-10-CM | POA: Diagnosis not present

## 2016-05-17 DIAGNOSIS — M19022 Primary osteoarthritis, left elbow: Secondary | ICD-10-CM | POA: Diagnosis not present

## 2016-05-17 DIAGNOSIS — Z888 Allergy status to other drugs, medicaments and biological substances status: Secondary | ICD-10-CM | POA: Diagnosis not present

## 2016-05-17 DIAGNOSIS — D509 Iron deficiency anemia, unspecified: Secondary | ICD-10-CM | POA: Insufficient documentation

## 2016-05-17 DIAGNOSIS — F419 Anxiety disorder, unspecified: Secondary | ICD-10-CM | POA: Insufficient documentation

## 2016-05-17 DIAGNOSIS — R12 Heartburn: Secondary | ICD-10-CM

## 2016-05-17 DIAGNOSIS — J45909 Unspecified asthma, uncomplicated: Secondary | ICD-10-CM | POA: Insufficient documentation

## 2016-05-17 DIAGNOSIS — Z9889 Other specified postprocedural states: Secondary | ICD-10-CM | POA: Diagnosis not present

## 2016-05-17 DIAGNOSIS — K2930 Chronic superficial gastritis without bleeding: Secondary | ICD-10-CM | POA: Diagnosis not present

## 2016-05-17 HISTORY — DX: Cough, unspecified: R05.9

## 2016-05-17 HISTORY — DX: Motion sickness, initial encounter: T75.3XXA

## 2016-05-17 HISTORY — DX: Cough: R05

## 2016-05-17 HISTORY — PX: ESOPHAGOGASTRODUODENOSCOPY (EGD) WITH PROPOFOL: SHX5813

## 2016-05-17 HISTORY — DX: Prediabetes: R73.03

## 2016-05-17 SURGERY — ESOPHAGOGASTRODUODENOSCOPY (EGD) WITH PROPOFOL
Anesthesia: General | Wound class: Clean Contaminated

## 2016-05-17 MED ORDER — ACETAMINOPHEN 160 MG/5ML PO SOLN: 325.0000 mg | ORAL | Status: DC | PRN

## 2016-05-17 MED ORDER — ACETAMINOPHEN 325 MG PO TABS: 325.0000 mg | ORAL_TABLET | ORAL | Status: DC | PRN

## 2016-05-17 MED ORDER — PROPOFOL 10 MG/ML IV BOLUS
INTRAVENOUS | Status: DC | PRN
  Administered 2016-05-17: 50 mg via INTRAVENOUS
  Administered 2016-05-17: 70 mg via INTRAVENOUS
  Administered 2016-05-17: 30 mg via INTRAVENOUS
  Administered 2016-05-17: 50 mg via INTRAVENOUS

## 2016-05-17 MED ORDER — LACTATED RINGERS IV SOLN
INTRAVENOUS | Status: DC
  Administered 2016-05-17: 09:00:00 via INTRAVENOUS

## 2016-05-17 MED ORDER — STERILE WATER FOR IRRIGATION IR SOLN
Status: DC | PRN
  Administered 2016-05-17: 5 mL

## 2016-05-17 MED ORDER — LIDOCAINE HCL (CARDIAC) 20 MG/ML IV SOLN
INTRAVENOUS | Status: DC | PRN
  Administered 2016-05-17: 50 mg via INTRAVENOUS

## 2016-05-17 MED ORDER — GLYCOPYRROLATE 0.2 MG/ML IJ SOLN
INTRAMUSCULAR | Status: DC | PRN
  Administered 2016-05-17: 0.2 mg via INTRAVENOUS

## 2016-05-17 SURGICAL SUPPLY — 32 items
BALLN DILATOR 10-12 8 (BALLOONS)
BALLN DILATOR 12-15 8 (BALLOONS)
BALLN DILATOR 15-18 8 (BALLOONS)
BALLN DILATOR CRE 0-12 8 (BALLOONS)
BALLN DILATOR ESOPH 8 10 CRE (MISCELLANEOUS) IMPLANT
BALLOON DILATOR 12-15 8 (BALLOONS) IMPLANT
BALLOON DILATOR 15-18 8 (BALLOONS) IMPLANT
BALLOON DILATOR CRE 0-12 8 (BALLOONS) IMPLANT
BLOCK BITE 60FR ADLT L/F GRN (MISCELLANEOUS) ×3 IMPLANT
CANISTER SUCT 1200ML W/VALVE (MISCELLANEOUS) ×3 IMPLANT
CLIP HMST 235XBRD CATH ROT (MISCELLANEOUS) IMPLANT
CLIP RESOLUTION 360 11X235 (MISCELLANEOUS)
FCP ESCP3.2XJMB 240X2.8X (MISCELLANEOUS) ×1
FORCEPS BIOP RAD 4 LRG CAP 4 (CUTTING FORCEPS) IMPLANT
FORCEPS BIOP RJ4 240 W/NDL (MISCELLANEOUS) ×3
FORCEPS ESCP3.2XJMB 240X2.8X (MISCELLANEOUS) IMPLANT
GOWN CVR UNV OPN BCK APRN NK (MISCELLANEOUS) ×2 IMPLANT
GOWN ISOL THUMB LOOP REG UNIV (MISCELLANEOUS) ×6
INJECTOR VARIJECT VIN23 (MISCELLANEOUS) IMPLANT
KIT DEFENDO VALVE AND CONN (KITS) IMPLANT
KIT ENDO PROCEDURE OLY (KITS) ×3 IMPLANT
MARKER SPOT ENDO TATTOO 5ML (MISCELLANEOUS) IMPLANT
PAD GROUND ADULT SPLIT (MISCELLANEOUS) IMPLANT
RETRIEVER NET PLAT FOOD (MISCELLANEOUS) IMPLANT
SNARE SHORT THROW 13M SML OVAL (MISCELLANEOUS) IMPLANT
SNARE SHORT THROW 30M LRG OVAL (MISCELLANEOUS) IMPLANT
SPOT EX ENDOSCOPIC TATTOO (MISCELLANEOUS)
SYR INFLATION 60ML (SYRINGE) IMPLANT
TRAP ETRAP POLY (MISCELLANEOUS) IMPLANT
VARIJECT INJECTOR VIN23 (MISCELLANEOUS)
WATER STERILE IRR 250ML POUR (IV SOLUTION) ×3 IMPLANT
WIRE CRE 18-20MM 8CM F G (MISCELLANEOUS) IMPLANT

## 2016-05-17 NOTE — Transfer of Care (Signed)
Immediate Anesthesia Transfer of Care Note  Patient: Newt MinionHeather Anne Wingrove  Procedure(s) Performed: Procedure(s): ESOPHAGOGASTRODUODENOSCOPY (EGD) WITH PROPOFOL (N/A)  Patient Location: PACU  Anesthesia Type: General  Level of Consciousness: awake, alert  and patient cooperative  Airway and Oxygen Therapy: Patient Spontanous Breathing and Patient connected to supplemental oxygen  Post-op Assessment: Post-op Vital signs reviewed, Patient's Cardiovascular Status Stable, Respiratory Function Stable, Patent Airway and No signs of Nausea or vomiting  Post-op Vital Signs: Reviewed and stable  Complications: No apparent anesthesia complications

## 2016-05-17 NOTE — Anesthesia Postprocedure Evaluation (Signed)
Anesthesia Post Note  Patient: Tamara MinionHeather [name]  Procedure(s) Performed: Procedure(s) (LRB): ESOPHAGOGASTRODUODENOSCOPY (EGD) WITH PROPOFOL (N/A)  Patient location during evaluation: PACU Anesthesia Type: MAC Level of consciousness: awake and alert Pain management: pain level controlled Vital Signs Assessment: post-procedure vital signs reviewed and stable Respiratory status: spontaneous breathing, nonlabored ventilation and respiratory function stable Cardiovascular status: stable and blood pressure returned to baseline Anesthetic complications: no    Alta CorningBacon, Raeli Wiens S

## 2016-05-17 NOTE — Anesthesia Preprocedure Evaluation (Addendum)
Anesthesia Evaluation  Patient identified by MRN, date of birth, ID band Patient awake    Reviewed: Allergy & Precautions, H&P , NPO status , Patient's Chart, lab work & pertinent test results, reviewed documented beta blocker date and time   Airway Mallampati: I  TM Distance: >3 FB Neck ROM: full    Dental no notable dental hx.    Pulmonary asthma , Current Smoker,    Pulmonary exam normal breath sounds clear to auscultation       Cardiovascular Exercise Tolerance: Good negative cardio ROS   Rhythm:regular Rate:Normal     Neuro/Psych PSYCHIATRIC DISORDERS Anxiety and panic attacksnegative neurological ROS     GI/Hepatic Neg liver ROS, GERD  ,  Endo/Other  negative endocrine ROS  Renal/GU negative Renal ROS  negative genitourinary   Musculoskeletal   Abdominal   Peds  Hematology negative hematology ROS (+)   Anesthesia Other Findings   Reproductive/Obstetrics negative OB ROS                             Anesthesia Physical Anesthesia Plan  ASA: II  Anesthesia Plan: MAC   Post-op Pain Management:    Induction:   Airway Management Planned:   Additional Equipment:   Intra-op Plan:   Post-operative Plan:   Informed Consent: I have reviewed the patients History and Physical, chart, labs and discussed the procedure including the risks, benefits and alternatives for the proposed anesthesia with the patient or authorized representative who has indicated his/her understanding and acceptance.   Dental Advisory Given  Plan Discussed with: CRNA  Anesthesia Plan Comments:        Anesthesia Quick Evaluation

## 2016-05-17 NOTE — Anesthesia Procedure Notes (Signed)
Procedure Name: MAC Date/Time: 05/17/2016 9:24 AM Performed by: Maryan RuedWILSON, Tamara Shetley M Pre-anesthesia Checklist: Patient identified, Emergency Drugs available, Suction available and Patient being monitored Patient Re-evaluated:Patient Re-evaluated prior to inductionOxygen Delivery Method: Nasal cannula

## 2016-05-17 NOTE — Op Note (Signed)
[facility] Gastroenterology Patient Name: [name] Procedure Date: 05/17/2016 9:07 AM MRN: [id number] Account #: [id number] Date of Birth: April 24, 1976 Admit Type: Outpatient Age: 40 Room: [facility] OR ROOM 01 Gender: Female Note Status: Finalized Procedure:            Upper GI endoscopy Indications:          Heartburn Providers:            Midge Minium MD, MD Referring MD:         Courtney Paris. White, MD (Referring MD) Medicines:            Propofol per Anesthesia Complications:        No immediate complications. Procedure:            Pre-Anesthesia Assessment:                       - Prior to the procedure, a History and Physical was                        performed, and patient medications and allergies were                        reviewed. The patient's tolerance of previous                        anesthesia was also reviewed. The risks and benefits of                        the procedure and the sedation options and risks were                        discussed with the patient. All questions were                        answered, and informed consent was obtained. Prior                        Anticoagulants: The patient has taken no previous                        anticoagulant or antiplatelet agents. ASA Grade                        Assessment: II - A patient with mild systemic disease.                        After reviewing the risks and benefits, the patient was                        deemed in satisfactory condition to undergo the                        procedure.                       After obtaining informed consent, the endoscope was                        passed under direct vision. Throughout the procedure,  the patient's blood pressure, pulse, and oxygen                        saturations were monitored continuously. The Olympus                        GIF H180J colonscope ([id number]#:[id number])([id number]#:[id number] was introduced                        through  the mouth, and advanced to the second part of                        duodenum. The upper GI endoscopy was accomplished                        without difficulty. The patient tolerated the procedure                        well. Findings:      There were esophageal mucosal changes consistent with long-segment       Barrett's esophagus present in the lower third of the esophagus. The       maximum longitudinal extent of these mucosal changes was 4 cm in length.       This was biopsied with a cold forceps for histology.      Localized mild inflammation characterized by erythema was found in the       gastric antrum. Biopsies were taken with a cold forceps for histology.      The examined duodenum was normal. Impression:           - Esophageal mucosal changes consistent with                        long-segment Barrett's esophagus. Biopsied.                       - Gastritis. Biopsied.                       - Normal examined duodenum. Recommendation:       - Discharge patient to home.                       - Resume previous diet.                       - Continue present medications. Procedure Code(s):    --- Professional ---                       [id number], Esophagogastroduodenoscopy, flexible, transoral;                        with biopsy, single or multiple Diagnosis Code(s):    --- Professional ---                       R12, Heartburn                       K22.8, Other specified diseases of esophagus                       K29.70, Gastritis, unspecified, without bleeding CPT copyright 2016  American Medical Association. All rights reserved. The codes documented in this report are preliminary and upon coder review may  be revised to meet current compliance requirements. Midge Minium MD, MD 05/17/2016 9:36:42 AM This report has been signed electronically. Number of Addenda: 0 Note Initiated On: 05/17/2016 9:07 AM      [facility]

## 2016-05-17 NOTE — H&P (Signed)
Midge Minium, MD [facility] [address] [place], [place] [address] Phone: [phone] Fax : [phone]  Primary Care Physician:  WHITE, Arlyss Repress, NP Primary Gastroenterologist:  Dr. Servando Snare  Pre-Procedure History & Physical: HPI:  [name] is a 40 y.o. female is here for an endoscopy.   Past Medical History:  Diagnosis Date  . Anemia    Low iron  . Anxiety    Panic Disorder  . Arthritis    back and knees, left elbow  . Asthma    long time since asthma attack  . Bronchitis    Chronic  . Cough    hx of bronchitis  . GERD (gastroesophageal reflux disease)   . Migraines    allergies  . Motion sickness   . Neuromuscular disorder (HCC)    numbness Right leg  . Pre-diabetes     Past Surgical History:  Procedure Laterality Date  . ABDOMINAL HYSTERECTOMY     2004  . BACK SURGERY  07/10/2006   L4-L5   . ETHMOIDECTOMY Bilateral 04/07/2015   Procedure:  TOTAL ETHMOIDECTOMIES;  Surgeon: Vernie Murders, MD;  Location: [facility];  Service: ENT;  Laterality: Bilateral;  . FRONTAL SINUS EXPLORATION Bilateral 04/07/2015   Procedure: FRONTAL SINUSOTOMIES;  Surgeon: Vernie Murders, MD;  Location: [facility];  Service: ENT;  Laterality: Bilateral;  . IMAGE GUIDED SINUS SURGERY N/A 04/07/2015   Procedure: IMAGE GUIDED SINUS SURGERY;  Surgeon: Vernie Murders, MD;  Location: [facility];  Service: ENT;  Laterality: N/A;  GAVE DISK TO BRENDA  . MAXILLARY ANTROSTOMY Bilateral 04/07/2015   Procedure: MAXILLARY ANTROSTOMIES;  Surgeon: Vernie Murders, MD;  Location: [facility];  Service: ENT;  Laterality: Bilateral;  . SEPTOPLASTY N/A 04/07/2015   Procedure: SEPTOPLASTY;  Surgeon: Vernie Murders, MD;  Location: [facility];  Service: ENT;  Laterality: N/A;  . TONSILLECTOMY      Prior to Admission medications   Medication Sig Start Date End Date Taking? Authorizing Provider  albuterol (PROVENTIL HFA;VENTOLIN HFA) 108 (90 BASE) MCG/ACT inhaler  Inhale into the lungs. 08/19/14  Yes Historical Provider, MD  clonazePAM (KLONOPIN) 0.5 MG tablet Take 0.5 mg by mouth 2 (two) times daily.   Yes Historical Provider, MD  dexlansoprazole (DEXILANT) 60 MG capsule Take 1 capsule (60 mg total) by mouth daily. Patient taking differently: Take 60 mg by mouth daily. 5pm 04/26/16  Yes Midge Minium, MD  tiZANidine (ZANAFLEX) 4 MG tablet Take by mouth every 6 (six) hours as needed.  05/01/11  Yes Historical Provider, MD  albuterol (PROVENTIL) (2.5 MG/3ML) 0.083% nebulizer solution Inhale into the lungs. 08/19/14 08/19/15  Historical Provider, MD  fluticasone-salmeterol (ADVAIR HFA) 115-21 MCG/ACT inhaler Inhale 2 puffs into the lungs 2 (two) times daily.    Historical Provider, MD  montelukast (SINGULAIR) 10 MG tablet Take by mouth. 08/19/14 08/19/15  Historical Provider, MD  omeprazole (PRILOSEC) 40 MG capsule Take by mouth. 08/19/14 08/19/15  Historical Provider, MD    Allergies as of 04/26/2016 - Review Complete 04/26/2016  Allergen Reaction Noted  . Penicillins Anaphylaxis 08/02/2009  . Oxycodone Itching and Nausea Only 12/31/2014  . Prednisone  12/31/2014  . Quinolones  12/31/2014  . Latex Rash 12/31/2014    Family History  Problem Relation Age of Onset  . Ovarian cancer Sister 85    Social History   Social History  . Marital status: Married    Spouse name: N/A  . Number of children: N/A  . Years of education: N/A  Occupational History  . Not on file.   Social History Main Topics  . Smoking status: Current Every Day Smoker    Packs/day: 0.25    Years: 20.00    Types: Cigarettes  . Smokeless tobacco: Never Used  . Alcohol use No  . Drug use: No  . Sexual activity: Not on file   Other Topics Concern  . Not on file   Social History Narrative  . No narrative on file    Review of Systems: See HPI, otherwise negative ROS  Physical Exam: BP 128/87   Pulse (!) 104   Temp 97.5 F (36.4 C)   Resp 16   Ht 5\' 7"  (1.702 m)   Wt  250 lb (113.4 kg)   SpO2 99%   BMI 39.16 kg/m  General:   Alert,  pleasant and cooperative in NAD Head:  Normocephalic and atraumatic. Neck:  Supple; no masses or thyromegaly. Lungs:  Clear throughout to auscultation.    Heart:  Regular rate and rhythm. Abdomen:  Soft, nontender and nondistended. Normal bowel sounds, without guarding, and without rebound.   Neurologic:  Alert and  oriented x4;  grossly normal neurologically.  Impression/Plan: [name] is here for an endoscopy to be performed for Barrett's  Risks, benefits, limitations, and alternatives regarding  endoscopy have been reviewed with the patient.  Questions have been answered.  All parties agreeable.   Midge Miniumarren Abeer Iversen, MD  05/17/2016, 9:17 AM

## 2016-05-18 ENCOUNTER — Encounter: Payer: Self-pay | Admitting: Gastroenterology

## 2016-05-22 ENCOUNTER — Encounter: Payer: Self-pay | Admitting: Gastroenterology

## 2016-05-25 ENCOUNTER — Telehealth: Payer: Self-pay

## 2016-05-25 NOTE — Telephone Encounter (Signed)
-----   Message from Midge Miniumarren Wohl, MD sent at 05/24/2016  7:06 AM EDT ----- Please have the patient come in for a follow up.

## 2016-05-25 NOTE — Telephone Encounter (Signed)
Pt scheduled for a follow up appt with Dr. Servando SnareWohl on Tuesday, Oct 31st.

## 2016-05-28 DIAGNOSIS — IMO0002 Reserved for concepts with insufficient information to code with codable children: Secondary | ICD-10-CM | POA: Insufficient documentation

## 2016-05-29 ENCOUNTER — Encounter: Payer: Self-pay | Admitting: Gastroenterology

## 2016-05-29 ENCOUNTER — Ambulatory Visit (INDEPENDENT_AMBULATORY_CARE_PROVIDER_SITE_OTHER): Payer: PPO | Admitting: Gastroenterology

## 2016-05-29 VITALS — BP 145/88 | HR 84 | Temp 98.1°F | Ht 67.0 in | Wt 250.0 lb

## 2016-05-29 DIAGNOSIS — K2270 Barrett's esophagus without dysplasia: Secondary | ICD-10-CM | POA: Diagnosis not present

## 2016-05-29 NOTE — Progress Notes (Signed)
   Primary Care Physician: WHITE, Arlyss RepressELIZABETH BURNEY, NP  Primary Gastroenterologist:  Dr. Midge Miniumarren Mazen Marcin  Chief Complaint  Patient presents with  . Follow up procedure results    HPI: [name] is a 40 y.o. female here for follow-up after having an EGD. The patient states that she has some acid breakthrough even on the Dexilant but states it is better than what she was taking before.  Current Outpatient Prescriptions  Medication Sig Dispense Refill  . albuterol (PROVENTIL HFA;VENTOLIN HFA) 108 (90 BASE) MCG/ACT inhaler Inhale into the lungs.    . clonazePAM (KLONOPIN) 0.5 MG tablet Take 0.5 mg by mouth 2 (two) times daily.    Marland Kitchen. dexlansoprazole (DEXILANT) 60 MG capsule Take 1 capsule (60 mg total) by mouth daily. (Patient taking differently: Take 60 mg by mouth daily. 5pm) 30 capsule 11  . fluticasone-salmeterol (ADVAIR HFA) 115-21 MCG/ACT inhaler Inhale 2 puffs into the lungs 2 (two) times daily.    Marland Kitchen. tiZANidine (ZANAFLEX) 4 MG tablet Take by mouth every 6 (six) hours as needed.     Marland Kitchen. albuterol (PROVENTIL) (2.5 MG/3ML) 0.083% nebulizer solution Inhale into the lungs.    . montelukast (SINGULAIR) 10 MG tablet Take by mouth.    Marland Kitchen. omeprazole (PRILOSEC) 40 MG capsule Take by mouth.     No current facility-administered medications for this visit.     Allergies as of 05/29/2016 - Review Complete 05/29/2016  Allergen Reaction Noted  . Penicillins Anaphylaxis 08/02/2009  . Oxycodone Itching and Nausea Only 12/31/2014  . Prednisone  12/31/2014  . Quinolones  12/31/2014  . Latex Rash 12/31/2014    ROS:  General: Negative for anorexia, weight loss, fever, chills, fatigue, weakness. ENT: Negative for hoarseness, difficulty swallowing , nasal congestion. CV: Negative for chest pain, angina, palpitations, dyspnea on exertion, peripheral edema.  Respiratory: Negative for dyspnea at rest, dyspnea on exertion, cough, sputum, wheezing.  GI: See history of present illness. GU:  Negative  for dysuria, hematuria, urinary incontinence, urinary frequency, nocturnal urination.  Endo: Negative for unusual weight change.    Physical Examination:   BP (!) 145/88   Pulse 84   Temp 98.1 F (36.7 C) (Oral)   Ht 5\' 7"  (1.702 m)   Wt 250 lb (113.4 kg)   BMI 39.16 kg/m   General: Well-nourished, well-developed in no acute distress.  Eyes: No icterus. Conjunctivae pink. Neuro: Alert and oriented x 3.  Grossly intact. Skin: Warm and dry, no jaundice.   Psych: Alert and cooperative, normal mood and affect.  Labs:    Imaging Studies: No results found.  Assessment and Plan:   [name] is a 40 y.o. y/o female who comes here today after having an EGD. The patient had Barrett's esophagus on EGD. The patient has a history of Barrett's esophagus. The patient will need a repeat EGD in 3 years. The patient will stay on her present medication and avoid the foods that she states causes her to have acid breakthrough. The patient has been explained the plan and agrees with it.   Note: This dictation was prepared with Dragon dictation along with smaller phrase technology. Any transcriptional errors that result from this process are unintentional.

## 2016-05-29 NOTE — Patient Instructions (Signed)
Please contact our office with any questions or concerns

## 2016-06-14 ENCOUNTER — Encounter: Payer: Self-pay | Admitting: Gastroenterology

## 2016-06-19 ENCOUNTER — Telehealth: Payer: Self-pay

## 2016-06-19 NOTE — Telephone Encounter (Signed)
Duplicate message. Pt came for a follow up to discuss this on Oct 31st.

## 2016-06-19 NOTE — Telephone Encounter (Signed)
-----   Message from Midge Miniumarren Wohl, MD sent at 06/17/2016  4:06 PM EST ----- Please have the patient come in for a follow up.

## 2016-08-22 DIAGNOSIS — S99921A Unspecified injury of right foot, initial encounter: Secondary | ICD-10-CM | POA: Diagnosis not present

## 2016-08-22 DIAGNOSIS — F17210 Nicotine dependence, cigarettes, uncomplicated: Secondary | ICD-10-CM | POA: Diagnosis not present

## 2016-08-22 DIAGNOSIS — J45909 Unspecified asthma, uncomplicated: Secondary | ICD-10-CM | POA: Diagnosis not present

## 2016-08-22 DIAGNOSIS — Z880 Allergy status to penicillin: Secondary | ICD-10-CM | POA: Diagnosis not present

## 2016-08-22 DIAGNOSIS — E669 Obesity, unspecified: Secondary | ICD-10-CM | POA: Diagnosis not present

## 2016-08-22 DIAGNOSIS — M25471 Effusion, right ankle: Secondary | ICD-10-CM | POA: Diagnosis not present

## 2016-08-22 DIAGNOSIS — R200 Anesthesia of skin: Secondary | ICD-10-CM | POA: Diagnosis not present

## 2016-08-22 DIAGNOSIS — W101XXA Fall (on)(from) sidewalk curb, initial encounter: Secondary | ICD-10-CM | POA: Diagnosis not present

## 2016-08-22 DIAGNOSIS — M25571 Pain in right ankle and joints of right foot: Secondary | ICD-10-CM | POA: Diagnosis not present

## 2016-08-22 DIAGNOSIS — E119 Type 2 diabetes mellitus without complications: Secondary | ICD-10-CM | POA: Diagnosis not present

## 2016-08-22 DIAGNOSIS — S99911A Unspecified injury of right ankle, initial encounter: Secondary | ICD-10-CM | POA: Diagnosis not present

## 2016-08-23 DIAGNOSIS — S99911A Unspecified injury of right ankle, initial encounter: Secondary | ICD-10-CM | POA: Diagnosis not present

## 2016-08-23 DIAGNOSIS — M25571 Pain in right ankle and joints of right foot: Secondary | ICD-10-CM | POA: Diagnosis not present

## 2016-08-23 DIAGNOSIS — S99921A Unspecified injury of right foot, initial encounter: Secondary | ICD-10-CM | POA: Diagnosis not present

## 2016-08-28 DIAGNOSIS — S93401A Sprain of unspecified ligament of right ankle, initial encounter: Secondary | ICD-10-CM | POA: Diagnosis not present

## 2016-09-17 DIAGNOSIS — M7661 Achilles tendinitis, right leg: Secondary | ICD-10-CM | POA: Diagnosis not present

## 2016-09-17 DIAGNOSIS — S93401D Sprain of unspecified ligament of right ankle, subsequent encounter: Secondary | ICD-10-CM | POA: Diagnosis not present

## 2016-09-28 DIAGNOSIS — R21 Rash and other nonspecific skin eruption: Secondary | ICD-10-CM | POA: Diagnosis not present

## 2016-09-28 DIAGNOSIS — E669 Obesity, unspecified: Secondary | ICD-10-CM | POA: Diagnosis not present

## 2016-09-28 DIAGNOSIS — R252 Cramp and spasm: Secondary | ICD-10-CM | POA: Diagnosis not present

## 2016-09-28 DIAGNOSIS — E119 Type 2 diabetes mellitus without complications: Secondary | ICD-10-CM | POA: Diagnosis not present

## 2016-09-28 DIAGNOSIS — F419 Anxiety disorder, unspecified: Secondary | ICD-10-CM | POA: Diagnosis not present

## 2016-09-28 DIAGNOSIS — J45909 Unspecified asthma, uncomplicated: Secondary | ICD-10-CM | POA: Diagnosis not present

## 2016-09-28 DIAGNOSIS — G43009 Migraine without aura, not intractable, without status migrainosus: Secondary | ICD-10-CM | POA: Diagnosis not present

## 2016-11-23 DIAGNOSIS — J029 Acute pharyngitis, unspecified: Secondary | ICD-10-CM | POA: Diagnosis not present

## 2017-01-28 DIAGNOSIS — Z720 Tobacco use: Secondary | ICD-10-CM | POA: Diagnosis not present

## 2017-01-28 DIAGNOSIS — Z8639 Personal history of other endocrine, nutritional and metabolic disease: Secondary | ICD-10-CM | POA: Diagnosis not present

## 2017-02-05 DIAGNOSIS — M5136 Other intervertebral disc degeneration, lumbar region: Secondary | ICD-10-CM | POA: Diagnosis not present

## 2017-02-05 DIAGNOSIS — Z6837 Body mass index (BMI) 37.0-37.9, adult: Secondary | ICD-10-CM | POA: Diagnosis not present

## 2017-03-26 DIAGNOSIS — K210 Gastro-esophageal reflux disease with esophagitis: Secondary | ICD-10-CM | POA: Diagnosis not present

## 2017-03-26 DIAGNOSIS — K760 Fatty (change of) liver, not elsewhere classified: Secondary | ICD-10-CM | POA: Diagnosis not present

## 2017-03-26 DIAGNOSIS — M5442 Lumbago with sciatica, left side: Secondary | ICD-10-CM | POA: Diagnosis not present

## 2017-03-26 DIAGNOSIS — G8929 Other chronic pain: Secondary | ICD-10-CM | POA: Diagnosis not present

## 2017-03-26 DIAGNOSIS — K2270 Barrett's esophagus without dysplasia: Secondary | ICD-10-CM | POA: Diagnosis not present

## 2017-03-26 DIAGNOSIS — E785 Hyperlipidemia, unspecified: Secondary | ICD-10-CM | POA: Diagnosis not present

## 2017-03-26 DIAGNOSIS — E669 Obesity, unspecified: Secondary | ICD-10-CM | POA: Diagnosis not present

## 2017-03-26 DIAGNOSIS — Z720 Tobacco use: Secondary | ICD-10-CM | POA: Diagnosis not present

## 2017-03-26 DIAGNOSIS — E1169 Type 2 diabetes mellitus with other specified complication: Secondary | ICD-10-CM | POA: Diagnosis not present

## 2017-03-27 ENCOUNTER — Other Ambulatory Visit: Payer: Self-pay

## 2017-03-27 ENCOUNTER — Other Ambulatory Visit: Payer: Self-pay | Admitting: General Surgery

## 2017-03-29 LAB — COMPREHENSIVE METABOLIC PANEL
A/G RATIO: 1.7 (ref 1.2–2.2)
ALBUMIN: 4.6 g/dL (ref 3.5–5.5)
ALK PHOS: 90 IU/L (ref 39–117)
ALT: 19 IU/L (ref 0–32)
AST: 19 IU/L (ref 0–40)
BILIRUBIN TOTAL: 0.3 mg/dL (ref 0.0–1.2)
BUN / CREAT RATIO: 16 (ref 9–23)
BUN: 13 mg/dL (ref 6–24)
CO2: 20 mmol/L (ref 20–29)
Calcium: 9.7 mg/dL (ref 8.7–10.2)
Chloride: 103 mmol/L (ref 96–106)
Creatinine, Ser: 0.8 mg/dL (ref 0.57–1.00)
GFR calc Af Amer: 105 mL/min/{1.73_m2} (ref >59)
GFR calc non Af Amer: 91 mL/min/{1.73_m2} (ref >59)
GLOBULIN, TOTAL: 2.7 g/dL (ref 1.5–4.5)
Glucose: 113 mg/dL — ABNORMAL HIGH (ref 65–99)
POTASSIUM: 4.8 mmol/L (ref 3.5–5.2)
SODIUM: 140 mmol/L (ref 134–144)
Total Protein: 7.3 g/dL (ref 6.0–8.5)

## 2017-03-29 LAB — CBC WITH DIFFERENTIAL/PLATELET
Basophils Absolute: 0 10*3/uL (ref 0.0–0.2)
Basos: 0 %
EOS (ABSOLUTE): 0.2 10*3/uL (ref 0.0–0.4)
Eos: 1 %
HEMATOCRIT: 42.6 % (ref 34.0–46.6)
HEMOGLOBIN: 14.3 g/dL (ref 11.1–15.9)
Immature Grans (Abs): 0 10*3/uL (ref 0.0–0.1)
Immature Granulocytes: 0 %
LYMPHS: 21 %
Lymphocytes Absolute: 3.2 10*3/uL — ABNORMAL HIGH (ref 0.7–3.1)
MCH: 29.7 pg (ref 26.6–33.0)
MCHC: 33.6 g/dL (ref 31.5–35.7)
MCV: 89 fL (ref 79–97)
MONOCYTES: 4 %
Monocytes Absolute: 0.6 10*3/uL (ref 0.1–0.9)
Neutrophils Absolute: 11.4 10*3/uL — ABNORMAL HIGH (ref 1.4–7.0)
Neutrophils: 74 %
Platelets: 385 10*3/uL — ABNORMAL HIGH (ref 150–379)
RBC: 4.81 x10E6/uL (ref 3.77–5.28)
RDW: 13.4 % (ref 12.3–15.4)
WBC: 15.5 10*3/uL — AB (ref 3.4–10.8)

## 2017-03-29 LAB — IRON AND TIBC
Iron Saturation: 13 % — ABNORMAL LOW (ref 15–55)
Iron: 41 ug/dL (ref 27–159)
TIBC: 304 ug/dL (ref 250–450)
UIBC: 263 ug/dL (ref 131–425)

## 2017-03-29 LAB — TSH: TSH: 1.02 u[IU]/mL (ref 0.450–4.500)

## 2017-03-29 LAB — H. PYLORI BREATH TEST: H PYLORI BREATH TEST: NEGATIVE

## 2017-03-29 LAB — LIPID PANEL W/O CHOL/HDL RATIO
CHOLESTEROL TOTAL: 208 mg/dL — AB (ref 100–199)
HDL: 39 mg/dL — AB (ref >39)
LDL Calculated: 134 mg/dL — ABNORMAL HIGH (ref 0–99)
Triglycerides: 175 mg/dL — ABNORMAL HIGH (ref 0–149)
VLDL CHOLESTEROL CAL: 35 mg/dL (ref 5–40)

## 2017-03-29 LAB — VITAMIN D 25 HYDROXY (VIT D DEFICIENCY, FRACTURES): VIT D 25 HYDROXY: 29.4 ng/mL — AB (ref 30.0–100.0)

## 2017-03-29 LAB — VITAMIN B12: Vitamin B-12: 488 pg/mL (ref 232–1245)

## 2017-03-29 LAB — AMBIG ABBREV LP DEFAULT

## 2017-03-29 LAB — VITAMIN B1: Thiamine: 152.1 nmol/L (ref 66.5–200.0)

## 2017-03-29 LAB — HCG, SERUM, QUALITATIVE: hCG,Beta Subunit,Qual,Serum: NEGATIVE m[IU]/mL (ref <6)

## 2017-03-29 LAB — AMBIG ABBREV CMP14 DEFAULT

## 2017-03-29 LAB — HGB A1C W/O EAG: HEMOGLOBIN A1C: 6.3 % — AB (ref 4.8–5.6)

## 2017-04-02 DIAGNOSIS — E119 Type 2 diabetes mellitus without complications: Secondary | ICD-10-CM | POA: Diagnosis not present

## 2017-04-02 DIAGNOSIS — E669 Obesity, unspecified: Secondary | ICD-10-CM | POA: Diagnosis not present

## 2017-04-02 DIAGNOSIS — E782 Mixed hyperlipidemia: Secondary | ICD-10-CM | POA: Diagnosis not present

## 2017-04-02 DIAGNOSIS — K22719 Barrett's esophagus with dysplasia, unspecified: Secondary | ICD-10-CM | POA: Diagnosis not present

## 2017-04-04 ENCOUNTER — Other Ambulatory Visit: Payer: Self-pay

## 2017-04-04 ENCOUNTER — Other Ambulatory Visit: Payer: Self-pay | Admitting: General Surgery

## 2017-04-04 ENCOUNTER — Ambulatory Visit
Admission: RE | Admit: 2017-04-04 | Discharge: 2017-04-04 | Disposition: A | Discharge status: Home / Self Care | Payer: PPO | Source: Ambulatory Visit | Attending: General Surgery | Admitting: General Surgery

## 2017-04-04 DIAGNOSIS — I10 Essential (primary) hypertension: Secondary | ICD-10-CM | POA: Diagnosis not present

## 2017-04-04 DIAGNOSIS — K449 Diaphragmatic hernia without obstruction or gangrene: Secondary | ICD-10-CM | POA: Insufficient documentation

## 2017-04-04 DIAGNOSIS — K440 Diaphragmatic hernia with obstruction, without gangrene: Secondary | ICD-10-CM | POA: Diagnosis not present

## 2017-04-15 ENCOUNTER — Encounter: Payer: PPO | Attending: General Surgery | Admitting: Skilled Nursing Facility1

## 2017-04-15 ENCOUNTER — Encounter: Payer: Self-pay | Admitting: Skilled Nursing Facility1

## 2017-04-15 DIAGNOSIS — E669 Obesity, unspecified: Secondary | ICD-10-CM | POA: Insufficient documentation

## 2017-04-15 DIAGNOSIS — Z833 Family history of diabetes mellitus: Secondary | ICD-10-CM | POA: Diagnosis not present

## 2017-04-15 DIAGNOSIS — Z8261 Family history of arthritis: Secondary | ICD-10-CM | POA: Diagnosis not present

## 2017-04-15 DIAGNOSIS — Z713 Dietary counseling and surveillance: Secondary | ICD-10-CM | POA: Insufficient documentation

## 2017-04-15 DIAGNOSIS — Z6839 Body mass index (BMI) 39.0-39.9, adult: Secondary | ICD-10-CM | POA: Diagnosis not present

## 2017-04-15 DIAGNOSIS — Z79899 Other long term (current) drug therapy: Secondary | ICD-10-CM | POA: Insufficient documentation

## 2017-04-15 DIAGNOSIS — Z8249 Family history of ischemic heart disease and other diseases of the circulatory system: Secondary | ICD-10-CM | POA: Diagnosis not present

## 2017-04-15 DIAGNOSIS — E119 Type 2 diabetes mellitus without complications: Secondary | ICD-10-CM | POA: Diagnosis not present

## 2017-04-15 NOTE — Progress Notes (Signed)
Pre-Op Assessment Visit:  Pre-Operative RYGB Surgery  Medical Nutrition Therapy:  Appt start time: 7:20  End time:  8:26  Patient was seen on 04/15/2017 for Pre-Operative Nutrition Assessment. Assessment and letter of approval faxed to [facility] Bariatric Surgery Program coordinator on 04/15/2017.   Pts A1C currently 6.3 down from 6.6. Pt arrived vexed she had to get her height and weight taken and states the scale and stadiometer are wrong with an aggressive tone. Latter in the appointment pt states she cannot stand to look at herself in the mirror and states she does not like herself so she does not like anyone else.  When dietitian educated the pt on carbohydrates she then stated she ate fruit every day which was not divulged in the original diet recall.   Pt expectation of surgery: to be able to more things and to better my health  Pt expectation of Dietitian: to be on a set plan and know what I can and cannot eat   Start weight at [facility]: 249.2 lbs BMI: 39.62  24 hr Dietary Recall: snacking on twisslers  First Meal: 1 egg and spinach or fruit salad  Snack:  Second Meal: chicken Cesar salad or protein shake Snack:  Third Meal: chicken and cheese and broccoli or chicken and asparagus with gluten free pasta   Snack:  Beverages: diet soda, water with flavorings, diet tea, coffee  Encouraged to engage in 150 minutes of moderate physical activity including cardiovascular and weight baring weekly  Handouts given during visit include:  . Pre-Op Goals . Bariatric Surgery Protein Shakes During the appointment today the following Pre-Op Goals were reviewed with the patient: . Maintain or lose weight as instructed by your surgeon . Make healthy food choices . Begin to limit portion sizes . Limited concentrated sugars and fried foods . Keep fat/sugar in the single digits per serving on             food labels . Practice CHEWING your food  (aim for 30 chews per bite or until  applesauce consistency) . Practice not drinking 15 minutes before, during, and 30 minutes after each meal/snack . Avoid all carbonated beverages  . Avoid/limit caffeinated beverages  . Avoid all sugar-sweetened beverages . Consume 3 meals per day; eat every 3-5 hours . Make a list of non-food related activities . Aim for 64-100 ounces of FLUID daily  . Aim for at least 60-80 grams of PROTEIN daily . Look for a liquid protein source that contain ?15 g protein and ?5 g carbohydrate  (ex: shakes, drinks, shots)  -Follow diet recommendations listed below   Energy and Macronutrient Recomendations: Calories: 1600 Carbohydrate: 180 Protein: 120 Fat: 44  Demonstrated degree of understanding via:  Teach Back  Teaching Method Utilized:  Visual Auditory Hands on  Barriers to learning/adherence to lifestyle change: none identified   Patient to call the [facility] to enroll in Pre-Op and Post-Op Nutrition Education when surgery date is scheduled.

## 2017-04-24 ENCOUNTER — Other Ambulatory Visit: Payer: Self-pay | Admitting: Family Medicine

## 2017-04-24 DIAGNOSIS — Z1231 Encounter for screening mammogram for malignant neoplasm of breast: Secondary | ICD-10-CM

## 2017-05-02 DIAGNOSIS — E669 Obesity, unspecified: Secondary | ICD-10-CM | POA: Diagnosis not present

## 2017-05-09 ENCOUNTER — Ambulatory Visit (INDEPENDENT_AMBULATORY_CARE_PROVIDER_SITE_OTHER): Payer: PPO | Admitting: Psychology

## 2017-05-09 DIAGNOSIS — E669 Obesity, unspecified: Secondary | ICD-10-CM

## 2017-05-09 DIAGNOSIS — F509 Eating disorder, unspecified: Secondary | ICD-10-CM

## 2017-05-13 ENCOUNTER — Ambulatory Visit
Admission: RE | Admit: 2017-05-13 | Discharge: 2017-05-13 | Disposition: A | Discharge status: Home / Self Care | Payer: PPO | Source: Ambulatory Visit | Attending: Family Medicine | Admitting: Family Medicine

## 2017-05-13 DIAGNOSIS — Z1231 Encounter for screening mammogram for malignant neoplasm of breast: Secondary | ICD-10-CM

## 2017-05-20 ENCOUNTER — Ambulatory Visit (INDEPENDENT_AMBULATORY_CARE_PROVIDER_SITE_OTHER): Payer: PPO | Admitting: Psychology

## 2017-05-20 ENCOUNTER — Encounter: Payer: PPO | Attending: General Surgery | Admitting: Skilled Nursing Facility1

## 2017-05-20 DIAGNOSIS — E669 Obesity, unspecified: Secondary | ICD-10-CM

## 2017-05-20 DIAGNOSIS — Z79899 Other long term (current) drug therapy: Secondary | ICD-10-CM | POA: Insufficient documentation

## 2017-05-20 DIAGNOSIS — Z8261 Family history of arthritis: Secondary | ICD-10-CM | POA: Insufficient documentation

## 2017-05-20 DIAGNOSIS — Z8249 Family history of ischemic heart disease and other diseases of the circulatory system: Secondary | ICD-10-CM | POA: Diagnosis not present

## 2017-05-20 DIAGNOSIS — E119 Type 2 diabetes mellitus without complications: Secondary | ICD-10-CM | POA: Diagnosis not present

## 2017-05-20 DIAGNOSIS — F509 Eating disorder, unspecified: Secondary | ICD-10-CM | POA: Diagnosis not present

## 2017-05-20 DIAGNOSIS — Z6839 Body mass index (BMI) 39.0-39.9, adult: Secondary | ICD-10-CM | POA: Diagnosis not present

## 2017-05-20 DIAGNOSIS — Z833 Family history of diabetes mellitus: Secondary | ICD-10-CM | POA: Diagnosis not present

## 2017-05-20 DIAGNOSIS — Z713 Dietary counseling and surveillance: Secondary | ICD-10-CM | POA: Insufficient documentation

## 2017-05-21 ENCOUNTER — Encounter: Payer: Self-pay | Admitting: Skilled Nursing Facility1

## 2017-05-21 NOTE — Progress Notes (Signed)
Pre-Operative Nutrition Class:  Appt start time: 9407   End time:  1830.  Patient was seen on 05/20/2017 for Pre-Operative Bariatric Surgery Education at the [facility].   Surgery date:  Surgery type: RYGB Start weight at [facility]: 249 Weight today: 256.6  Samples given per MNT protocol. Patient educated on appropriate usage: Lot # [id number] Exp: 07/19  Bariatric Advantage Calcium  Lot # [id number] Exp: sep-13-2019  Renee Pain Protein Shake Lot # [id number] Exp: may-27-19  The following the learning objectives were met by the patient during this course:  Identify Pre-Op Dietary Goals and will begin 2 weeks pre-operatively  Identify appropriate sources of fluids and proteins   State protein recommendations and appropriate sources pre and post-operatively  Identify Post-Operative Dietary Goals and will follow for 2 weeks post-operatively  Identify appropriate multivitamin and calcium sources  Describe the need for physical activity post-operatively and will follow MD recommendations  State when to call healthcare provider regarding medication questions or post-operative complications  Handouts given during class include:  Pre-Op Bariatric Surgery Diet Handout  Protein Shake Handout  Post-Op Bariatric Surgery Nutrition Handout  [facility] Program Information Flyer  Support Group Information Flyer  [facility] Bariatric Supplements Price List  [facility]: [facility] per MD.

## 2017-05-27 ENCOUNTER — Ambulatory Visit: Payer: PPO

## 2017-06-14 ENCOUNTER — Ambulatory Visit: Payer: Self-pay | Admitting: General Surgery

## 2017-06-14 DIAGNOSIS — G8929 Other chronic pain: Secondary | ICD-10-CM | POA: Diagnosis not present

## 2017-06-14 DIAGNOSIS — E785 Hyperlipidemia, unspecified: Secondary | ICD-10-CM | POA: Diagnosis not present

## 2017-06-14 DIAGNOSIS — M5442 Lumbago with sciatica, left side: Secondary | ICD-10-CM | POA: Diagnosis not present

## 2017-06-14 DIAGNOSIS — K210 Gastro-esophageal reflux disease with esophagitis: Secondary | ICD-10-CM | POA: Diagnosis not present

## 2017-06-14 DIAGNOSIS — E559 Vitamin D deficiency, unspecified: Secondary | ICD-10-CM | POA: Diagnosis not present

## 2017-06-14 DIAGNOSIS — K449 Diaphragmatic hernia without obstruction or gangrene: Secondary | ICD-10-CM | POA: Diagnosis not present

## 2017-06-14 DIAGNOSIS — K760 Fatty (change of) liver, not elsewhere classified: Secondary | ICD-10-CM | POA: Diagnosis not present

## 2017-06-14 DIAGNOSIS — E669 Obesity, unspecified: Secondary | ICD-10-CM | POA: Diagnosis not present

## 2017-06-14 DIAGNOSIS — Z720 Tobacco use: Secondary | ICD-10-CM | POA: Diagnosis not present

## 2017-06-14 DIAGNOSIS — K2270 Barrett's esophagus without dysplasia: Secondary | ICD-10-CM | POA: Diagnosis not present

## 2017-06-14 DIAGNOSIS — E1169 Type 2 diabetes mellitus with other specified complication: Secondary | ICD-10-CM | POA: Diagnosis not present

## 2017-06-14 NOTE — H&P (Signed)
[name] 06/14/2017 4:48 PM Location: [place] Office Patient #: [id number] DOB: Apr 08, 1976 Married / Language: [place] / Race: White Female  History of Present Illness Tamara Areola M. Verlee Pope [name]; 06/14/2017 6:32 PM) The patient is a 41 year old female who presents for a bariatric surgery evaluation. She comes in today for preoperative evaluation. Her chart has been submitted for insurance approval. She denies any changes to her medical history since I initially saw her at the end of August. However she did quit smoking approximately 3 months ago. She used a combination of patches and gum has been nicotine free and tobacco free for 3 months. She states that her reflux may be a little bit better. Otherwise she denies any chest pain, chest pressure, shortness of breath, orthopnea, paroxysmal nocturnal dyspnea, jaw pain. Sweating or nausea.  Her upper GI showed a small hiatal hernia. Chest x-ray was unremarkable. Blood work revealed evidence of chronic leukocytosis around 13. Otherwise her CBC was normal. Her hemoglobin A1c was 6.3. Vitamin D was low at 29. Total cholesterol level was 209, triglyceride level 175, HDL level 39, LDL level 134.  03/26/17 She is referred by Titus Mould NP for evaluation of weight loss surgery. She completed our seminar in person. She was initially interested in the sleeve gastrectomy however she is leaning towards a gastric bypass because of her diagnosis of Barrett's esophagus. She wants to improve her health and improve her diabetes. She has several acquaintances who have undergone weight loss surgery. She is also attended seminars at other weight loss surgery programs and has done extensive research. Despite numerous attempts for sustained weight loss she has been unsuccessful. She has done Atkins, Weight Watchers, low calorie diets, gym memberships, and phentermine-all without any long-term success.  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 She does have some occasional asthma and uses an inhaler as needed. She does take Dexilant daily for reflux. She occasionally has to take omeprazole at night. She has daily bowel movements. She denies any melena or hematochezia. She had an upper endoscopy in October 2017 which showed a long segment of Barrett's esophagus. Her biopsy was consistent with Barrett's esophagus without dysplasia. H. pylori was negative. Dr Servando Snare is her gastroenterologist. she had an abdominal ultrasound which showed hepatic steatosis. She has had a complete hysterectomy due to endometriosis. She denies any hematuria or dysuria. She has chronic low back pain which extends to the left side. She has had several back surgeries.  She is diet controlled diabetes mellitus type 2. Her last hemoglobin A1c was 6.6. She has occasional headaches. Her last mammogram was in September 2017 which was within normal limits. She does smoke. However she is actively cutting back. At one point she did one pack per day. She is down to 4 cigarettes per day. She denies any drugs or alcohol.  Her spinal surgeon is Dr. Mikal Plane.     Problem List/Past Medical Tamara Areola M. Andrey Campanile, [name]; 06/14/2017 6:39 PM) Anxiety Back Pain CHRONIC MIDLINE LOW BACK PAIN WITH LEFT-SIDED SCIATICA (M54.42) GERD WITH ESOPHAGITIS (K21.0) DIABETES MELLITUS TYPE 2 IN OBESE (E11.69) DYSLIPIDEMIA (E78.5) FATTY LIVER (K76.0) VITAMIN D DEFICIENCY (E55.9) TOBACCO USE (Z72.0) HIATAL HERNIA (K44.9) OBESITY (BMI 30-39.9) (E66.9) BARRETT'S ESOPHAGUS WITHOUT DYSPLASIA (K22.70)  Past Surgical History Tamara Areola M.  Andrey CampanileWilson, [name]; 06/14/2017 6:39 PM) Oral  Surgery Tonsillectomy Hysterectomy (not due to cancer) - Complete Spinal Surgery - Lower Back  Diagnostic Studies History Tamara Areola(Tamara Glomb M. Andrey CampanileWilson, [name]; 06/14/2017 6:39 PM) Mammogram  Allergies Mary Sella(Aristide Waggle M. Andrey CampanileWilson, [name]; 06/14/2017 6:39 PM) Penicillins Anaphylaxis. Avelox *FLUOROQUINOLONES* PredniSONE (Pak) *CORTICOSTEROIDS* Roxicodone *ANALGESICS - OPIOID*  Medication History Tamara Areola(Tamara Duignan M. Andrey CampanileWilson, [name]; 06/14/2017 6:39 PM) Medications Reconciled OxyCODONE HCl (5MG /5ML Solution, 5-10 Milliliter Oral every four hours, as needed, Taken starting 06/14/2017) Active. Zofran ODT (4MG  Tablet Disint, 1 (one) Tablet Disperse Oral every six hours, as needed, Taken starting 06/14/2017) Active. Neomycin Sulfate (500MG  Tablet, 2 (two) Tablet Oral SEE NOTE, Taken starting 06/14/2017) Active. (TAKE TWO TABLETS AT 2 PM, 3 PM, AND 10 PM THE DAY PRIOR TO SURGERY) Flagyl (500MG  Tablet, 2 (two) Tablet Oral SEE NOTE, Taken starting 06/14/2017) Active. (Take at 2pm, 3pm, and 10pm the day prior to your colon operation) Albuterol (90MCG/ACT Aerosol Soln, Inhalation) Active. BuPROPion HCl ER (XL) (150MG  Tablet ER 24HR, Oral) Active. Cetirizine HCl (10MG  Tablet, Oral) Active. Clotrimazole-Betamethasone (1-0.05% Cream, External) Active. Dexlansoprazole (60MG  Capsule DR, Oral) Active. Omeprazole (10MG  Capsule DR, Oral) Active. TiZANidine HCl (4MG  Capsule, Oral) Active.  Family History Tamara Areola(Aracely Rickett M. Andrey CampanileWilson, [name]; 06/14/2017 6:39 PM) Arthritis Father. Diabetes Mellitus Father. Heart Disease Hypertension     Review of Systems Tamara Areola([name]; 06/14/2017 6:32 PM) General Present- Night Sweats. HEENT Present- Seasonal Allergies. Gastrointestinal Present- Indigestion. Musculoskeletal Present- Back Pain. Psychiatric Present- Anxiety. All other systems negative  Vitals ([name] CMA; 06/14/2017 4:51 PM) 06/14/2017 4:51 PM Weight: 259.4 lb Height: 67in Body Surface Area: 2.26 m Body Mass  Index: 40.63 kg/m  Pulse: 86 (Regular)  BP: 110/78 (Sitting, Left Arm, Standard)      Physical Exam Tamara Areola(Aryiana Klinkner M. Sayler Mickiewicz [name]; 06/14/2017 6:32 PM)  General Mental Status-Alert. General Appearance-Consistent with stated age. Hydration-Well hydrated. Voice-Normal. Note: morbidly obese  Integumentary Note: multiple tattoos, no edema  Head and Neck Head-normocephalic, atraumatic with no lesions or palpable masses. Trachea-midline. Thyroid Gland Characteristics - normal size and consistency.  Eye Eyeball - Bilateral-Extraocular movements intact. Sclera/Conjunctiva - Bilateral-No scleral icterus.  ENMT Note: EARS - normal external ears lips - not dry, not cracked  Chest and Lung Exam Chest and lung exam reveals -quiet, even and easy respiratory effort with no use of accessory muscles and on auscultation, normal breath sounds, no adventitious sounds and normal vocal resonance. Inspection Chest Wall - Normal. Back - normal.  Breast - Did not examine.  Cardiovascular Cardiovascular examination reveals -normal heart sounds, regular rate and rhythm with no murmurs and normal pedal pulses bilaterally.  Abdomen Inspection Inspection of the abdomen reveals - No Hernias. Skin - Scar - no surgical scars. Palpation/Percussion Palpation and Percussion of the abdomen reveal - Soft, Non Tender, No Rebound tenderness, No Rigidity (guarding) and No hepatosplenomegaly. Auscultation Auscultation of the abdomen reveals - Bowel sounds normal.  Peripheral Vascular Upper Extremity Palpation - Pulses bilaterally normal.  Neurologic Neurologic evaluation reveals -alert and oriented x 3 with no impairment of recent or remote memory. Mental Status-Normal.  Neuropsychiatric The patient's mood and affect are described as -normal. Judgment and Insight-insight is appropriate concerning matters relevant to self.  Musculoskeletal Normal Exam - Left-Upper  Extremity Strength Normal and Lower Extremity Strength Normal. Normal Exam - Right-Upper Extremity Strength Normal and Lower Extremity Strength Normal.  Lymphatic Head & Neck  General Head & Neck Lymphatics: Bilateral - Description - Normal. Axillary - Did not examine. Femoral & Inguinal -Note:NO LAD.  Assessment & Plan Tamara Areola M. Jaala Bohle [name]; 06/14/2017 6:36 PM)  OBESITY (BMI 30-39.9) (E66.9) Impression: The patient meets weight loss surgery criteria. I think the patient would be an acceptable candidate for Laparoscopic Roux-en-Y Gastric bypass.  We had previously discussed the risks, benefits, and long-term outcomes. She had no additional questions regarding today's aspects of bariatric surgery. We reviewed her preoperative workup going over her labs and imaging. We did discuss the findings of a small hiatal hernia. I discussed that we would tests for one intraoperatively. If found to have it we would go ahead and proceed with repair. We discussed what that would involve. I congratulated her on stopping smoking. We will test her urine nicotine 2 ensure compliance. We discussed the long-term ramifications with respect ulcer formation with possible perforation should she resumed smoking. We discussed the probable need for ongoing PPI therapy after surgery because of her history of Barrett's. We discussed the typical after course. She was given her postoperative prescriptions along with her bowel prep today. I encouraged her to contact the office should she have any additional questions  Current Plans Pt Education - EMW_preopbariatric BARRETT'S ESOPHAGUS WITHOUT DYSPLASIA (K22.70) Impression: With her biopsy-proven Barrett's esophagus I am reluctant to recommend a sleep gastrectomy because of the potential for worsening reflux disease; therefore, I recommended Roux-en-Y gastric bypass as this procedure has the best chance of improving her reflux and ameliorating her symptoms as well as  resolving her Barrett's esophagus  DYSLIPIDEMIA (E78.5)  FATTY LIVER (K76.0)  DIABETES MELLITUS TYPE 2 IN OBESE (E11.69)  CHRONIC MIDLINE LOW BACK PAIN WITH LEFT-SIDED SCIATICA (M54.42)  GERD WITH ESOPHAGITIS (K21.0)  HIATAL HERNIA (K44.9) Impression: see above  VITAMIN D DEFICIENCY (E55.9) Impression: She is a mild deficiency of vitamin D. She is artery taking a vitamin D supplement. I recommended that she get at least 1000 international units per day.  TOBACCO USE (Z72.0) Impression: I congratulated her for stopping smoking. I advised her that we will check a urine nicotine level to make sure. If she is still smoking surgery will be canceled  Mary Sella. Andrey Campanile, [name], FACS General, Bariatric, & Minimally Invasive Surgery [facility], [place]

## 2017-06-17 ENCOUNTER — Other Ambulatory Visit: Payer: Self-pay

## 2017-06-17 DIAGNOSIS — Z720 Tobacco use: Secondary | ICD-10-CM | POA: Diagnosis not present

## 2017-06-18 LAB — NICOTINE SCREEN, URINE: COTININE UR QL SCN: NEGATIVE ng/mL

## 2017-07-03 NOTE — Patient Instructions (Signed)
Tamara MinionHeather [name]  07/03/2017   Your procedure is scheduled on: 07/09/2017    [facility]  [facility]trance [place]  [place]vators to 3rd floor to  Short Stay Center at    0530 AM.    Call this number if you have problems the morning of surgery [phone]    Remember: ONLY 1 PERSON MAY GO WITH YOU TO SHORT STAY TO GET  READY MORNING OF YOUR SURGERY.  Do not eat food or drink liquids :After Midnight.     Take these medicines the morning of surgery with A SIP OF WATER: Albuterol Inhaler if needed and bring, Nebulizer if needed, Singulair, PRotonix                                 You may not have any metal on your body including hair pins and              piercings  Do not wear jewelry, make-up, lotions, powders or perfumes, deodorant             Do not wear nail polish.  Do not shave  48 hours prior to surgery.                Do not bring valuables to the hospital. [facility] IS NOT             RESPONSIBLE   FOR VALUABLES.  Contacts, dentures or bridgework may not be worn into surgery.  Leave suitcase in the car. After surgery it may be brought to your room.     Special Instructions: coughing and deep breathing exercises, leg exercises               Please read over the following fact sheets you were given: _____________________________________________________________________             [facility] - Preparing [facility] Before surgery, you can play an important role.  Because skin is not sterile, your skin needs to be as free of germs as possible.  You can reduce the number of germs on your skin by washing with CHG (chlorahexidine gluconate) soap before surgery.  CHG is an antiseptic cleaner which kills germs and bonds with the skin to continue killing germs even after washing. Please DO NOT use if you have an allergy to CHG or antibacterial soaps.  If your skin becomes reddened/irritated stop using the CHG and inform your nurse when you arrive  at Short Stay. Do not shave (including legs and underarms) for at least 48 hours prior to the first CHG shower.  You may shave your face/neck. Please follow these instructions carefully:  1.  Shower with CHG Soap the night before surgery and the  morning of Surgery.  2.  If you choose to wash your hair, wash your hair first as usual with your  normal  shampoo.  3.  After you shampoo, rinse your hair and body thoroughly to remove the  shampoo.                           4.  Use CHG as you would any other liquid soap.  You can apply chg directly  to the skin and wash  Gently with a scrungie or clean washcloth.  5.  Apply the CHG Soap to your body ONLY FROM THE NECK DOWN.   Do not use on face/ open                           Wound or open sores. Avoid contact with eyes, ears mouth and genitals (private parts).                       Wash face,  Genitals (private parts) with your normal soap.             6.  Wash thoroughly, paying special attention to the area where your surgery  will be performed.  7.  Thoroughly rinse your body with warm water from the neck down.  8.  DO NOT shower/wash with your normal soap after using and rinsing off  the CHG Soap.                9.  Pat yourself dry with a clean towel.            10.  Wear clean pajamas.            11.  Place clean sheets on your bed the night of your first shower and do not  sleep with pets. Day of Surgery : Do not apply any lotions/deodorants the morning of surgery.  Please wear clean clothes to the hospital/surgery center.  FAILURE TO FOLLOW THESE INSTRUCTIONS MAY RESULT IN THE CANCELLATION OF YOUR SURGERY PATIENT SIGNATURE_________________________________  NURSE SIGNATURE__________________________________  ________________________________________________________________________   Adam Phenix  An incentive spirometer is a tool that can help keep your lungs clear and active. This tool measures how well you  are filling your lungs with each breath. Taking long deep breaths may help reverse or decrease the chance of developing breathing (pulmonary) problems (especially infection) following:  A long period of time when you are unable to move or be active. BEFORE THE PROCEDURE   If the spirometer includes an indicator to show your best effort, your nurse or respiratory therapist will set it to a desired goal.  If possible, sit up straight or lean slightly forward. Try not to slouch.  Hold the incentive spirometer in an upright position. INSTRUCTIONS FOR USE  1. Sit on the edge of your bed if possible, or sit up as far as you can in bed or on a chair. 2. Hold the incentive spirometer in an upright position. 3. Breathe out normally. 4. Place the mouthpiece in your mouth and seal your lips tightly around it. 5. Breathe in slowly and as deeply as possible, raising the piston or the ball toward the top of the column. 6. Hold your breath for 3-5 seconds or for as long as possible. Allow the piston or ball to fall to the bottom of the column. 7. Remove the mouthpiece from your mouth and breathe out normally. 8. Rest for a few seconds and repeat Steps 1 through 7 at least 10 times every 1-2 hours when you are awake. Take your time and take a few normal breaths between deep breaths. 9. The spirometer may include an indicator to show your best effort. Use the indicator as a goal to work toward during each repetition. 10. After each set of 10 deep breaths, practice coughing to be sure your lungs are clear. If you have an incision (the cut made at the time of surgery),  support your incision when coughing by placing a pillow or rolled up towels firmly against it. Once you are able to get out of bed, walk around indoors and cough well. You may stop using the incentive spirometer when instructed by your caregiver.  RISKS AND COMPLICATIONS  Take your time so you do not get dizzy or light-headed.  If you are in  pain, you may need to take or ask for pain medication before doing incentive spirometry. It is harder to take a deep breath if you are having pain. AFTER USE  Rest and breathe slowly and easily.  It can be helpful to keep track of a log of your progress. Your caregiver can provide you with a simple table to help with this. If you are using the spirometer at home, follow these instructions: [place] IF:   You are having difficultly using the spirometer.  You have trouble using the spirometer as often as instructed.  Your pain medication is not giving enough relief while using the spirometer.  You develop fever of 100.5 F (38.1 C) or higher. SEEK IMMEDIATE MEDICAL CARE IF:   You cough up bloody sputum that had not been present before.  You develop fever of 102 F (38.9 C) or greater.  You develop worsening pain at or near the incision site. MAKE SURE YOU:   Understand these instructions.  Will watch your condition.  Will get help right away if you are not doing well or get worse. Document Released: 11/26/2006 Document Revised: 10/08/2011 Document Reviewed: 01/27/2007 [facility] Patient Information 2014 [place], [place].   ________________________________________________________________________

## 2017-07-04 ENCOUNTER — Encounter (HOSPITAL_COMMUNITY)
Admission: RE | Admit: 2017-07-04 | Discharge: 2017-07-04 | Disposition: A | Discharge status: Home / Self Care | Payer: PPO | Source: Ambulatory Visit | Attending: General Surgery | Admitting: General Surgery

## 2017-07-04 ENCOUNTER — Other Ambulatory Visit: Payer: Self-pay

## 2017-07-04 ENCOUNTER — Encounter (HOSPITAL_COMMUNITY): Payer: Self-pay

## 2017-07-04 DIAGNOSIS — Z01812 Encounter for preprocedural laboratory examination: Secondary | ICD-10-CM | POA: Insufficient documentation

## 2017-07-04 LAB — CBC WITH DIFFERENTIAL/PLATELET
BASOS ABS: 0 10*3/uL (ref 0.0–0.1)
Basophils Relative: 0 %
EOS PCT: 2 %
Eosinophils Absolute: 0.2 10*3/uL (ref 0.0–0.7)
HEMATOCRIT: 39.4 % (ref 36.0–46.0)
Hemoglobin: 12.7 g/dL (ref 12.0–15.0)
LYMPHS ABS: 2.6 10*3/uL (ref 0.7–4.0)
LYMPHS PCT: 28 %
MCH: 29.5 pg (ref 26.0–34.0)
MCHC: 32.2 g/dL (ref 30.0–36.0)
MCV: 91.4 fL (ref 78.0–100.0)
MONO ABS: 0.6 10*3/uL (ref 0.1–1.0)
Monocytes Relative: 6 %
NEUTROS ABS: 5.8 10*3/uL (ref 1.7–7.7)
Neutrophils Relative %: 64 %
PLATELETS: 357 10*3/uL (ref 150–400)
RBC: 4.31 MIL/uL (ref 3.87–5.11)
RDW: 13 % (ref 11.5–15.5)
WBC: 9.1 10*3/uL (ref 4.0–10.5)

## 2017-07-04 LAB — COMPREHENSIVE METABOLIC PANEL
ALT: 32 U/L (ref 14–54)
AST: 27 U/L (ref 15–41)
Albumin: 4.3 g/dL (ref 3.5–5.0)
Alkaline Phosphatase: 79 U/L (ref 38–126)
Anion gap: 9 (ref 5–15)
BILIRUBIN TOTAL: 0.8 mg/dL (ref 0.3–1.2)
BUN: 15 mg/dL (ref 6–20)
CHLORIDE: 105 mmol/L (ref 101–111)
CO2: 24 mmol/L (ref 22–32)
CREATININE: 0.67 mg/dL (ref 0.44–1.00)
Calcium: 9.1 mg/dL (ref 8.9–10.3)
Glucose, Bld: 140 mg/dL — ABNORMAL HIGH (ref 65–99)
Potassium: 4.2 mmol/L (ref 3.5–5.1)
Sodium: 138 mmol/L (ref 135–145)
TOTAL PROTEIN: 7.6 g/dL (ref 6.5–8.1)

## 2017-07-04 LAB — HEMOGLOBIN A1C
HEMOGLOBIN A1C: 6.5 % — AB (ref 4.8–5.6)
MEAN PLASMA GLUCOSE: 139.85 mg/dL

## 2017-07-04 NOTE — Progress Notes (Signed)
EKG-04/04/17-epic and CXR- 2018 in epic

## 2017-07-08 MED ORDER — GENTAMICIN SULFATE 40 MG/ML IJ SOLN
INTRAVENOUS | Status: AC
  Administered 2017-07-09: 900 mL via INTRAVENOUS
  Filled 2017-07-08: qty 10.5

## 2017-07-08 NOTE — Anesthesia Preprocedure Evaluation (Addendum)
Anesthesia Evaluation  Patient identified by MRN, date of birth, ID band Patient awake    Reviewed: Allergy & Precautions, H&P , Patient's Chart, lab work & pertinent test results, reviewed documented beta blocker date and time   Airway Mallampati: III  TM Distance: >3 FB Neck ROM: full    Dental no notable dental hx.    Pulmonary asthma , former smoker,    Pulmonary exam normal breath sounds clear to auscultation       Cardiovascular  Rhythm:regular Rate:Normal     Neuro/Psych    GI/Hepatic GERD  ,  Endo/Other    Renal/GU      Musculoskeletal   Abdominal   Peds  Hematology   Anesthesia Other Findings   Reproductive/Obstetrics                            Anesthesia Physical Anesthesia Plan  ASA: III  Anesthesia Plan: General   Post-op Pain Management:    Induction: Intravenous  PONV Risk Score and Plan: 2 and Dexamethasone, Ondansetron and Treatment may vary due to age or medical condition  Airway Management Planned: Oral ETT  Additional Equipment:   Intra-op Plan:   Post-operative Plan: Extubation in OR  Informed Consent: I have reviewed the patients History and Physical, chart, labs and discussed the procedure including the risks, benefits and alternatives for the proposed anesthesia with the patient or authorized representative who has indicated his/her understanding and acceptance.   Dental Advisory Given  Plan Discussed with: CRNA and Surgeon  Anesthesia Plan Comments: (  )       Anesthesia Quick Evaluation

## 2017-07-09 ENCOUNTER — Encounter (HOSPITAL_COMMUNITY)
Admission: RE | Disposition: A | Discharge status: Home / Self Care | Payer: Self-pay | Source: Ambulatory Visit | Attending: General Surgery

## 2017-07-09 ENCOUNTER — Inpatient Hospital Stay (HOSPITAL_COMMUNITY): Payer: PPO | Admitting: Anesthesiology

## 2017-07-09 ENCOUNTER — Inpatient Hospital Stay (HOSPITAL_COMMUNITY)
Admission: RE | Admit: 2017-07-09 | Discharge: 2017-07-11 | DRG: 621 | Disposition: A | Discharge status: Home / Self Care | Payer: PPO | Source: Ambulatory Visit | Attending: General Surgery | Admitting: General Surgery

## 2017-07-09 ENCOUNTER — Other Ambulatory Visit: Payer: Self-pay

## 2017-07-09 ENCOUNTER — Encounter (HOSPITAL_COMMUNITY): Payer: Self-pay | Admitting: Emergency Medicine

## 2017-07-09 DIAGNOSIS — Z833 Family history of diabetes mellitus: Secondary | ICD-10-CM

## 2017-07-09 DIAGNOSIS — Z6839 Body mass index (BMI) 39.0-39.9, adult: Secondary | ICD-10-CM | POA: Diagnosis not present

## 2017-07-09 DIAGNOSIS — K219 Gastro-esophageal reflux disease without esophagitis: Secondary | ICD-10-CM | POA: Diagnosis not present

## 2017-07-09 DIAGNOSIS — Z8261 Family history of arthritis: Secondary | ICD-10-CM

## 2017-07-09 DIAGNOSIS — K449 Diaphragmatic hernia without obstruction or gangrene: Secondary | ICD-10-CM | POA: Diagnosis not present

## 2017-07-09 DIAGNOSIS — F17210 Nicotine dependence, cigarettes, uncomplicated: Secondary | ICD-10-CM | POA: Diagnosis present

## 2017-07-09 DIAGNOSIS — K760 Fatty (change of) liver, not elsewhere classified: Secondary | ICD-10-CM | POA: Diagnosis present

## 2017-07-09 DIAGNOSIS — E669 Obesity, unspecified: Secondary | ICD-10-CM | POA: Diagnosis present

## 2017-07-09 DIAGNOSIS — E119 Type 2 diabetes mellitus without complications: Secondary | ICD-10-CM

## 2017-07-09 DIAGNOSIS — Z8249 Family history of ischemic heart disease and other diseases of the circulatory system: Secondary | ICD-10-CM

## 2017-07-09 DIAGNOSIS — Z880 Allergy status to penicillin: Secondary | ICD-10-CM | POA: Diagnosis not present

## 2017-07-09 DIAGNOSIS — K210 Gastro-esophageal reflux disease with esophagitis: Secondary | ICD-10-CM | POA: Diagnosis not present

## 2017-07-09 DIAGNOSIS — E785 Hyperlipidemia, unspecified: Secondary | ICD-10-CM | POA: Diagnosis present

## 2017-07-09 DIAGNOSIS — K22719 Barrett's esophagus with dysplasia, unspecified: Secondary | ICD-10-CM | POA: Diagnosis present

## 2017-07-09 DIAGNOSIS — J45909 Unspecified asthma, uncomplicated: Secondary | ICD-10-CM | POA: Diagnosis present

## 2017-07-09 DIAGNOSIS — E559 Vitamin D deficiency, unspecified: Secondary | ICD-10-CM | POA: Diagnosis present

## 2017-07-09 DIAGNOSIS — F419 Anxiety disorder, unspecified: Secondary | ICD-10-CM | POA: Diagnosis present

## 2017-07-09 DIAGNOSIS — Z6841 Body Mass Index (BMI) 40.0 and over, adult: Secondary | ICD-10-CM

## 2017-07-09 DIAGNOSIS — D72829 Elevated white blood cell count, unspecified: Secondary | ICD-10-CM | POA: Diagnosis present

## 2017-07-09 DIAGNOSIS — Z9884 Bariatric surgery status: Secondary | ICD-10-CM

## 2017-07-09 DIAGNOSIS — M5136 Other intervertebral disc degeneration, lumbar region: Secondary | ICD-10-CM | POA: Diagnosis not present

## 2017-07-09 DIAGNOSIS — K2270 Barrett's esophagus without dysplasia: Secondary | ICD-10-CM | POA: Diagnosis not present

## 2017-07-09 HISTORY — PX: LAPAROSCOPIC ROUX-EN-Y GASTRIC BYPASS WITH HIATAL HERNIA REPAIR: SHX6513

## 2017-07-09 HISTORY — PX: UPPER GI ENDOSCOPY: SHX6162

## 2017-07-09 LAB — HEMOGLOBIN AND HEMATOCRIT, BLOOD
HEMATOCRIT: 38.6 % (ref 36.0–46.0)
HEMOGLOBIN: 12.6 g/dL (ref 12.0–15.0)

## 2017-07-09 SURGERY — CREATION, GASTRIC BYPASS, LAPAROSCOPIC, USING ROUX-EN-Y GASTROENTEROSTOMY, WITH HIATAL HERNIA REPAIR
Anesthesia: General | Site: Abdomen

## 2017-07-09 MED ORDER — LACTATED RINGERS IV SOLN
INTRAVENOUS | Status: DC | PRN
  Administered 2017-07-09 (×2): via INTRAVENOUS

## 2017-07-09 MED ORDER — PREMIER PROTEIN SHAKE
2.0000 [oz_av] | ORAL | Status: DC
  Administered 2017-07-11 (×4): 2 [oz_av] via ORAL

## 2017-07-09 MED ORDER — ONDANSETRON HCL 4 MG/2ML IJ SOLN
4.0000 mg | Freq: Four times a day (QID) | INTRAMUSCULAR | Status: DC | PRN
  Administered 2017-07-09 – 2017-07-11 (×4): 4 mg via INTRAVENOUS
  Filled 2017-07-09 (×6): qty 2

## 2017-07-09 MED ORDER — FENTANYL CITRATE (PF) 100 MCG/2ML IJ SOLN
INTRAMUSCULAR | Status: AC
  Filled 2017-07-09: qty 2

## 2017-07-09 MED ORDER — LACTATED RINGERS IR SOLN
Status: DC | PRN
  Administered 2017-07-09: 1000 mL

## 2017-07-09 MED ORDER — PROMETHAZINE HCL 25 MG/ML IJ SOLN
12.5000 mg | Freq: Four times a day (QID) | INTRAMUSCULAR | Status: DC | PRN
  Administered 2017-07-10: 12.5 mg via INTRAVENOUS
  Filled 2017-07-09: qty 1

## 2017-07-09 MED ORDER — SUCCINYLCHOLINE CHLORIDE 200 MG/10ML IV SOSY
PREFILLED_SYRINGE | INTRAVENOUS | Status: AC
  Filled 2017-07-09: qty 10

## 2017-07-09 MED ORDER — POTASSIUM CHLORIDE IN NACL 20-0.45 MEQ/L-% IV SOLN
INTRAVENOUS | Status: DC
  Administered 2017-07-09 – 2017-07-10 (×3): via INTRAVENOUS
  Filled 2017-07-09 (×6): qty 1000

## 2017-07-09 MED ORDER — FENTANYL CITRATE (PF) 100 MCG/2ML IJ SOLN
INTRAMUSCULAR | Status: DC | PRN
  Administered 2017-07-09 (×4): 50 ug via INTRAVENOUS

## 2017-07-09 MED ORDER — HEPARIN SODIUM (PORCINE) 5000 UNIT/ML IJ SOLN
5000.0000 [IU] | INTRAMUSCULAR | Status: AC
  Administered 2017-07-09: 5000 [IU] via SUBCUTANEOUS
  Filled 2017-07-09: qty 1

## 2017-07-09 MED ORDER — ONDANSETRON HCL 4 MG/2ML IJ SOLN
INTRAMUSCULAR | Status: DC | PRN
  Administered 2017-07-09: 4 mg via INTRAVENOUS

## 2017-07-09 MED ORDER — PROPOFOL 10 MG/ML IV BOLUS
INTRAVENOUS | Status: AC
  Filled 2017-07-09: qty 20

## 2017-07-09 MED ORDER — SODIUM CHLORIDE 0.9 % IJ SOLN
INTRAMUSCULAR | Status: AC
  Filled 2017-07-09: qty 50

## 2017-07-09 MED ORDER — ALBUTEROL SULFATE (2.5 MG/3ML) 0.083% IN NEBU: 2.5000 mg | INHALATION_SOLUTION | Freq: Four times a day (QID) | RESPIRATORY_TRACT | Status: DC | PRN

## 2017-07-09 MED ORDER — ACETAMINOPHEN 500 MG PO TABS
1000.0000 mg | ORAL_TABLET | ORAL | Status: AC
  Administered 2017-07-09: 1000 mg via ORAL
  Filled 2017-07-09: qty 2

## 2017-07-09 MED ORDER — BUPIVACAINE LIPOSOME 1.3 % IJ SUSP
INTRAMUSCULAR | Status: DC | PRN
  Administered 2017-07-09: 20 mL

## 2017-07-09 MED ORDER — EVICEL 5 ML EX KIT
PACK | CUTANEOUS | Status: DC | PRN
  Administered 2017-07-09: 1

## 2017-07-09 MED ORDER — DEXAMETHASONE SODIUM PHOSPHATE 10 MG/ML IJ SOLN
INTRAMUSCULAR | Status: AC
  Filled 2017-07-09: qty 1

## 2017-07-09 MED ORDER — MORPHINE SULFATE (PF) 2 MG/ML IV SOLN
1.0000 mg | INTRAVENOUS | Status: DC | PRN
  Administered 2017-07-09 – 2017-07-10 (×3): 1 mg via INTRAVENOUS
  Administered 2017-07-11: 2 mg via INTRAVENOUS
  Filled 2017-07-09 (×4): qty 1

## 2017-07-09 MED ORDER — LACTATED RINGERS IV SOLN: INTRAVENOUS | Status: DC

## 2017-07-09 MED ORDER — SUGAMMADEX SODIUM 500 MG/5ML IV SOLN
INTRAVENOUS | Status: DC | PRN
  Administered 2017-07-09: 300 mg via INTRAVENOUS

## 2017-07-09 MED ORDER — LIDOCAINE 2% (20 MG/ML) 5 ML SYRINGE
INTRAMUSCULAR | Status: AC
  Filled 2017-07-09: qty 5

## 2017-07-09 MED ORDER — ENOXAPARIN SODIUM 30 MG/0.3ML ~~LOC~~ SOLN
30.0000 mg | Freq: Two times a day (BID) | SUBCUTANEOUS | Status: DC
  Administered 2017-07-09 – 2017-07-11 (×4): 30 mg via SUBCUTANEOUS
  Filled 2017-07-09 (×4): qty 0.3

## 2017-07-09 MED ORDER — PROMETHAZINE HCL 25 MG/ML IJ SOLN
6.2500 mg | INTRAMUSCULAR | Status: DC
  Administered 2017-07-09: 6.25 mg via INTRAVENOUS

## 2017-07-09 MED ORDER — DIPHENHYDRAMINE HCL 50 MG/ML IJ SOLN: 12.5000 mg | Freq: Three times a day (TID) | INTRAMUSCULAR | Status: DC | PRN

## 2017-07-09 MED ORDER — OXYCODONE HCL 5 MG/5ML PO SOLN
5.0000 mg | ORAL | Status: DC | PRN
  Administered 2017-07-09 – 2017-07-11 (×7): 5 mg via ORAL
  Filled 2017-07-09 (×7): qty 5

## 2017-07-09 MED ORDER — APREPITANT 40 MG PO CAPS
40.0000 mg | ORAL_CAPSULE | ORAL | Status: AC
  Administered 2017-07-09: 40 mg via ORAL
  Filled 2017-07-09: qty 1

## 2017-07-09 MED ORDER — LIDOCAINE 2% (20 MG/ML) 5 ML SYRINGE
INTRAMUSCULAR | Status: DC | PRN
  Administered 2017-07-09: 100 mg via INTRAVENOUS

## 2017-07-09 MED ORDER — MIDAZOLAM HCL 2 MG/2ML IJ SOLN
INTRAMUSCULAR | Status: DC | PRN
  Administered 2017-07-09: 2 mg via INTRAVENOUS

## 2017-07-09 MED ORDER — SCOPOLAMINE 1 MG/3DAYS TD PT72
1.0000 | MEDICATED_PATCH | TRANSDERMAL | Status: DC
  Administered 2017-07-09: 1.5 mg via TRANSDERMAL
  Filled 2017-07-09: qty 1

## 2017-07-09 MED ORDER — GABAPENTIN 300 MG PO CAPS
300.0000 mg | ORAL_CAPSULE | ORAL | Status: AC
  Administered 2017-07-09: 300 mg via ORAL
  Filled 2017-07-09: qty 1

## 2017-07-09 MED ORDER — CHLORHEXIDINE GLUCONATE 4 % EX LIQD: 60.0000 mL | Freq: Once | CUTANEOUS | Status: DC

## 2017-07-09 MED ORDER — EPHEDRINE 5 MG/ML INJ
INTRAVENOUS | Status: AC
  Filled 2017-07-09: qty 10

## 2017-07-09 MED ORDER — ROCURONIUM BROMIDE 50 MG/5ML IV SOSY
PREFILLED_SYRINGE | INTRAVENOUS | Status: AC
  Filled 2017-07-09: qty 5

## 2017-07-09 MED ORDER — DEXAMETHASONE SODIUM PHOSPHATE 4 MG/ML IJ SOLN
4.0000 mg | INTRAMUSCULAR | Status: AC
  Administered 2017-07-09: 10 mg via INTRAVENOUS

## 2017-07-09 MED ORDER — SUGAMMADEX SODIUM 500 MG/5ML IV SOLN
INTRAVENOUS | Status: AC
  Filled 2017-07-09: qty 5

## 2017-07-09 MED ORDER — BUPIVACAINE LIPOSOME 1.3 % IJ SUSP
20.0000 mL | Freq: Once | INTRAMUSCULAR | Status: DC
  Filled 2017-07-09: qty 20

## 2017-07-09 MED ORDER — GABAPENTIN 250 MG/5ML PO SOLN
200.0000 mg | Freq: Two times a day (BID) | ORAL | Status: DC
  Administered 2017-07-09 – 2017-07-11 (×4): 200 mg via ORAL
  Filled 2017-07-09 (×6): qty 4

## 2017-07-09 MED ORDER — PROPOFOL 10 MG/ML IV BOLUS
INTRAVENOUS | Status: DC | PRN
  Administered 2017-07-09: 200 mg via INTRAVENOUS

## 2017-07-09 MED ORDER — ACETAMINOPHEN 160 MG/5ML PO SOLN
650.0000 mg | Freq: Four times a day (QID) | ORAL | Status: DC
  Administered 2017-07-09 – 2017-07-11 (×8): 650 mg via ORAL
  Filled 2017-07-09 (×9): qty 20.3

## 2017-07-09 MED ORDER — MIDAZOLAM HCL 2 MG/2ML IJ SOLN
INTRAMUSCULAR | Status: AC
  Filled 2017-07-09: qty 2

## 2017-07-09 MED ORDER — FENTANYL CITRATE (PF) 100 MCG/2ML IJ SOLN
25.0000 ug | INTRAMUSCULAR | Status: DC | PRN
Start: 2017-07-09 — End: 2017-07-09
  Administered 2017-07-09 (×2): 50 ug via INTRAVENOUS

## 2017-07-09 MED ORDER — EVICEL 5 ML EX KIT
PACK | Freq: Once | CUTANEOUS | Status: DC
  Filled 2017-07-09: qty 1

## 2017-07-09 MED ORDER — PANTOPRAZOLE SODIUM 40 MG IV SOLR
40.0000 mg | Freq: Every day | INTRAVENOUS | Status: DC
  Administered 2017-07-09 – 2017-07-10 (×2): 40 mg via INTRAVENOUS
  Filled 2017-07-09 (×2): qty 40

## 2017-07-09 MED ORDER — ONDANSETRON HCL 4 MG/2ML IJ SOLN
INTRAMUSCULAR | Status: AC
  Filled 2017-07-09: qty 2

## 2017-07-09 MED ORDER — ROCURONIUM BROMIDE 50 MG/5ML IV SOSY
PREFILLED_SYRINGE | INTRAVENOUS | Status: DC | PRN
  Administered 2017-07-09: 20 mg via INTRAVENOUS
  Administered 2017-07-09: 10 mg via INTRAVENOUS
  Administered 2017-07-09: 20 mg via INTRAVENOUS
  Administered 2017-07-09: 50 mg via INTRAVENOUS

## 2017-07-09 MED ORDER — SODIUM CHLORIDE 0.9 % IJ SOLN
INTRAMUSCULAR | Status: DC | PRN
  Administered 2017-07-09: 50 mL via INTRAVENOUS

## 2017-07-09 MED ORDER — SUCCINYLCHOLINE CHLORIDE 200 MG/10ML IV SOSY
PREFILLED_SYRINGE | INTRAVENOUS | Status: DC | PRN
  Administered 2017-07-09: 120 mg via INTRAVENOUS

## 2017-07-09 MED ORDER — EPHEDRINE SULFATE 50 MG/ML IJ SOLN
INTRAMUSCULAR | Status: DC | PRN
  Administered 2017-07-09: 5 mg via INTRAVENOUS

## 2017-07-09 MED ORDER — SIMETHICONE 80 MG PO CHEW
80.0000 mg | CHEWABLE_TABLET | Freq: Four times a day (QID) | ORAL | Status: DC | PRN
  Administered 2017-07-10 – 2017-07-11 (×2): 80 mg via ORAL
  Filled 2017-07-09 (×2): qty 1

## 2017-07-09 MED ORDER — PROMETHAZINE HCL 25 MG/ML IJ SOLN
INTRAMUSCULAR | Status: AC
  Filled 2017-07-09: qty 1

## 2017-07-09 MED ORDER — FENTANYL CITRATE (PF) 100 MCG/2ML IJ SOLN
INTRAMUSCULAR | Status: AC
Start: 2017-07-09 — End: 2017-07-09
  Filled 2017-07-09: qty 2

## 2017-07-09 SURGICAL SUPPLY — 80 items
ADH SKN CLS APL DERMABOND .7 (GAUZE/BANDAGES/DRESSINGS) ×2
APPLICATOR COTTON TIP 6IN STRL (MISCELLANEOUS) ×4 IMPLANT
APPLIER CLIP ROT 13.4 12 LRG (CLIP)
APR CLP LRG 13.4X12 ROT 20 MLT (CLIP)
BLADE SURG SZ11 CARB STEEL (BLADE) ×4 IMPLANT
CABLE HIGH FREQUENCY MONO STRZ (ELECTRODE) ×2 IMPLANT
CHLORAPREP W/TINT 26ML (MISCELLANEOUS) ×8 IMPLANT
CLIP APPLIE ROT 13.4 12 LRG (CLIP) IMPLANT
CLIP SUT LAPRA TY ABSORB (SUTURE) ×8 IMPLANT
CUTTER FLEX LINEAR 45M (STAPLE) IMPLANT
DERMABOND ADVANCED (GAUZE/BANDAGES/DRESSINGS) ×2
DERMABOND ADVANCED .7 DNX12 (GAUZE/BANDAGES/DRESSINGS) IMPLANT
DEVICE SUT QUICK LOAD TK 5 (STAPLE) IMPLANT
DEVICE SUT TI-KNOT TK 5X26 (MISCELLANEOUS) ×1 IMPLANT
DEVICE SUTURE ENDOST 10MM (ENDOMECHANICALS) ×4 IMPLANT
DEVICE TI KNOT TK5 (MISCELLANEOUS) ×1
DRAIN PENROSE 18X1/4 LTX STRL (WOUND CARE) ×2 IMPLANT
DRAIN PENROSE LF 8X20.3CM SIL (WOUND CARE) ×2 IMPLANT
ELECT REM PT RETURN 15FT ADLT (MISCELLANEOUS) ×4 IMPLANT
GAUZE SPONGE 4X4 12PLY STRL (GAUZE/BANDAGES/DRESSINGS) IMPLANT
GAUZE SPONGE 4X4 16PLY XRAY LF (GAUZE/BANDAGES/DRESSINGS) ×4 IMPLANT
GLOVE BIO SURGEON STRL SZ7.5 (GLOVE) IMPLANT
GLOVE INDICATOR 8.0 STRL GRN (GLOVE) ×2 IMPLANT
GOWN STRL REUS W/TWL XL LVL3 (GOWN DISPOSABLE) ×16 IMPLANT
HOLDER FOLEY CATH W/STRAP (MISCELLANEOUS) ×2 IMPLANT
HOVERMATT SINGLE USE (MISCELLANEOUS) ×4 IMPLANT
KIT BASIN OR (CUSTOM PROCEDURE TRAY) ×4 IMPLANT
KIT CLEAN ENDO COMPLIANCE (KITS) ×2 IMPLANT
KIT GASTRIC LAVAGE 34FR ADT (SET/KITS/TRAYS/PACK) ×4 IMPLANT
LUBRICANT JELLY K Y 4OZ (MISCELLANEOUS) ×4 IMPLANT
MARKER SKIN DUAL TIP RULER LAB (MISCELLANEOUS) ×4 IMPLANT
NDL SPNL 22GX3.5 QUINCKE BK (NEEDLE) ×2 IMPLANT
NEEDLE SPNL 22GX3.5 QUINCKE BK (NEEDLE) ×4 IMPLANT
PACK CARDIOVASCULAR III (CUSTOM PROCEDURE TRAY) ×4 IMPLANT
QUICK LOAD TK 5 (STAPLE)
RELOAD 45 VASCULAR/THIN (ENDOMECHANICALS) IMPLANT
RELOAD ENDO STITCH 2.0 (ENDOMECHANICALS) ×44
RELOAD STAPLE 45 2.5 WHT GRN (ENDOMECHANICALS) IMPLANT
RELOAD STAPLE 45 3.5 BLU ETS (ENDOMECHANICALS) IMPLANT
RELOAD STAPLE 60 2.6 WHT THN (STAPLE) ×4 IMPLANT
RELOAD STAPLE 60 3.6 BLU REG (STAPLE) ×4 IMPLANT
RELOAD STAPLE 60 3.8 GOLD REG (STAPLE) ×2 IMPLANT
RELOAD STAPLE TA45 3.5 REG BLU (ENDOMECHANICALS) IMPLANT
RELOAD STAPLER BLUE 60MM (STAPLE) ×8 IMPLANT
RELOAD STAPLER GOLD 60MM (STAPLE) ×2 IMPLANT
RELOAD STAPLER WHITE 60MM (STAPLE) ×4 IMPLANT
RELOAD SUT SNGL STCH ABSRB 2-0 (ENDOMECHANICALS) ×8 IMPLANT
RELOAD SUT SNGL STCH BLK 2-0 (ENDOMECHANICALS) ×8 IMPLANT
SCISSORS LAP 5X45 EPIX DISP (ENDOMECHANICALS) ×4 IMPLANT
SET IRRIG TUBING LAPAROSCOPIC (IRRIGATION / IRRIGATOR) ×4 IMPLANT
SHEARS HARMONIC ACE PLUS 45CM (MISCELLANEOUS) ×4 IMPLANT
SLEEVE XCEL OPT CAN 5 100 (ENDOMECHANICALS) ×4 IMPLANT
SOLUTION ANTI FOG 6CC (MISCELLANEOUS) ×4 IMPLANT
STAPLER ECHELON BIOABSB 60 FLE (MISCELLANEOUS) IMPLANT
STAPLER ECHELON LONG 60 440 (INSTRUMENTS) ×4 IMPLANT
STAPLER RELOAD BLUE 60MM (STAPLE) ×16
STAPLER RELOAD GOLD 60MM (STAPLE) ×4
STAPLER RELOAD WHITE 60MM (STAPLE) ×8
SUT MNCRL AB 4-0 PS2 18 (SUTURE) ×4 IMPLANT
SUT RELOAD ENDO STITCH 2 48X1 (ENDOMECHANICALS) ×14
SUT RELOAD ENDO STITCH 2.0 (ENDOMECHANICALS) ×8
SUT SURGIDAC NAB ES-9 0 48 120 (SUTURE) ×4 IMPLANT
SUT VIC AB 2-0 SH 27 (SUTURE) ×4
SUT VIC AB 2-0 SH 27X BRD (SUTURE) ×2 IMPLANT
SUTURE RELOAD END STTCH 2 48X1 (ENDOMECHANICALS) ×14 IMPLANT
SUTURE RELOAD ENDO STITCH 2.0 (ENDOMECHANICALS) ×8 IMPLANT
SYR 10ML ECCENTRIC (SYRINGE) ×4 IMPLANT
SYR 20CC LL (SYRINGE) ×8 IMPLANT
TIP RIGID 35CM EVICEL (HEMOSTASIS) ×4 IMPLANT
TOWEL OR 17X26 10 PK STRL BLUE (TOWEL DISPOSABLE) ×4 IMPLANT
TOWEL OR NON WOVEN STRL DISP B (DISPOSABLE) ×4 IMPLANT
TRAY FOLEY BAG SILVER LF 16FR (CATHETERS) ×2 IMPLANT
TRAY FOLEY W/METER SILVER 16FR (SET/KITS/TRAYS/PACK) ×2 IMPLANT
TROCAR BLADELESS OPT 5 100 (ENDOMECHANICALS) ×4 IMPLANT
TROCAR UNIVERSAL OPT 12M 100M (ENDOMECHANICALS) ×12 IMPLANT
TROCAR XCEL 12X100 BLDLESS (ENDOMECHANICALS) ×4 IMPLANT
TUBING CONNECTING 10 (TUBING) ×2 IMPLANT
TUBING CONNECTING 10' (TUBING) ×2
TUBING ENDO SMARTCAP PENTAX (MISCELLANEOUS) ×4 IMPLANT
TUBING INSUF HEATED (TUBING) ×4 IMPLANT

## 2017-07-09 NOTE — Anesthesia Procedure Notes (Signed)
Procedure Name: Intubation Date/Time: 07/09/2017 7:27 AM Performed by: Dione Booze, CRNA Pre-anesthesia Checklist: Suction available, Patient being monitored, Emergency Drugs available and Patient identified Patient Re-evaluated:Patient Re-evaluated prior to induction Oxygen Delivery Method: Circle system utilized Preoxygenation: Pre-oxygenation with 100% oxygen Induction Type: IV induction Ventilation: Mask ventilation without difficulty Laryngoscope Size: Mac and 4 Grade View: Grade II Tube type: Oral Tube size: 7.5 mm Number of attempts: 1 Airway Equipment and Method: Stylet Placement Confirmation: ETT inserted through vocal cords under direct vision,  positive ETCO2 and breath sounds checked- equal and bilateral Secured at: 22 cm Tube secured with: Tape Dental Injury: Teeth and Oropharynx as per pre-operative assessment  Difficulty Due To: Difficulty was anticipated

## 2017-07-09 NOTE — Interval H&P Note (Signed)
History and Physical Interval Note:  07/09/2017 7:11 AM  [name] GeneraAnne Welch  has presented today for surgery, with the diagnosis of Morbid Obesity, DMII, , GERD, NASH, Barretts Esophagus, Vit D Def, Hiatal Hernia  The various methods of treatment have been discussed with the patient and family. After consideration of risks, benefits and other options for treatment, the patient has consented to  Procedure(s): LAPAROSCOPIC ROUX-EN-Y GASTRIC BYPASS WITH HIATAL HERNIA REPAIR (N/A) as a surgical intervention .  The patient's history has been reviewed, patient examined, no change in status, stable for surgery.  I have reviewed the patient's chart and labs.  Questions were answered to the patient's satisfaction.    Mary SellaEric M. Andrey CampanileWilson, MD, FACS General, Bariatric, & Minimally Invasive Surgery [facility]   Gaynelle AduEric Ayaka Andes

## 2017-07-09 NOTE — Progress Notes (Signed)
Discussed post op day goals with patient including ambulation, IS, diet progression, pain, and nausea control.  Questions answered. 

## 2017-07-09 NOTE — H&P (Signed)
[name] 06/14/2017 4:48 PM Location: [place] Office Patient #: [id number] DOB: 11/08/1975 Married / Language: English / Race: White Female  History of Present Illness (Shynice Sigel M. Shemar Plemmons MD; 06/14/2017 6:32 PM) The patient is a 41 year old female who presents for a bariatric surgery evaluation. She comes in today for preoperative evaluation. Her chart has been submitted for insurance approval. She denies any changes to her medical history since I initially saw her at the end of August. However she did quit smoking approximately 3 months ago. She used a combination of patches and gum has been nicotine free and tobacco free for 3 months. She states that her reflux may be a little bit better. Otherwise she denies any chest pain, chest pressure, shortness of breath, orthopnea, paroxysmal nocturnal dyspnea, jaw pain. Sweating or nausea.  Her upper GI showed a small hiatal hernia. Chest x-ray was unremarkable. Blood work revealed evidence of chronic leukocytosis around 13. Otherwise her CBC was normal. Her hemoglobin A1c was 6.3. Vitamin D was low at 29. Total cholesterol level was 209, triglyceride level 175, HDL level 39, LDL level 134.  03/26/17 She is referred by Elizabeth Burney White NP for evaluation of weight loss surgery. She completed our seminar in person. She was initially interested in the sleeve gastrectomy however she is leaning towards a gastric bypass because of her diagnosis of Barrett's esophagus. She wants to improve her health and improve her diabetes. She has several acquaintances who have undergone weight loss surgery. She is also attended seminars at other weight loss surgery programs and has done extensive research. Despite numerous attempts for sustained weight loss she has been unsuccessful. She has done Atkins, Weight Watchers, low calorie diets, gym memberships, and phentermine-all without any long-term success.  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 She does have some occasional asthma and uses an inhaler as needed. She does take Dexilant daily for reflux. She occasionally has to take omeprazole at night. She has daily bowel movements. She denies any melena or hematochezia. She had an upper endoscopy in October 2017 which showed a long segment of Barrett's esophagus. Her biopsy was consistent with Barrett's esophagus without dysplasia. H. pylori was negative. Dr Wohl is her gastroenterologist. she had an abdominal ultrasound which showed hepatic steatosis. She has had a complete hysterectomy due to endometriosis. She denies any hematuria or dysuria. She has chronic low back pain which extends to the left side. She has had several back surgeries.  She is diet controlled diabetes mellitus type 2. Her last hemoglobin A1c was 6.6. She has occasional headaches. Her last mammogram was in September 2017 which was within normal limits. She does smoke. However she is actively cutting back. At one point she did one pack per day. She is down to 4 cigarettes per day. She denies any drugs or alcohol.  Her spinal surgeon is Dr. Cabell.     Problem List/Past Medical (Remijio Holleran M. Robyn Galati, MD; 06/14/2017 6:39 PM) Anxiety Back Pain CHRONIC MIDLINE LOW BACK PAIN WITH LEFT-SIDED SCIATICA (M54.42) GERD WITH ESOPHAGITIS (K21.0) DIABETES MELLITUS TYPE 2 IN OBESE (E11.69) DYSLIPIDEMIA (E78.5) FATTY LIVER (K76.0) VITAMIN D DEFICIENCY (E55.9) TOBACCO USE (Z72.0) HIATAL HERNIA (K44.9) OBESITY (BMI 30-39.9) (E66.9) BARRETT'S ESOPHAGUS WITHOUT DYSPLASIA (K22.70)  Past Surgical History (Dolph Tavano M.   Chailyn Racette, MD; 06/14/2017 6:39 PM) Oral  Surgery Tonsillectomy Hysterectomy (not due to cancer) - Complete Spinal Surgery - Lower Back  Diagnostic Studies History (Lurleen Soltero M. Kennede Lusk, MD; 06/14/2017 6:39 PM) Mammogram  Allergies (Berta Denson M. Taite Baldassari, MD; 06/14/2017 6:39 PM) Penicillins Anaphylaxis. Avelox *FLUOROQUINOLONES* PredniSONE (Pak) *CORTICOSTEROIDS* Roxicodone *ANALGESICS - OPIOID*  Medication History (Gini Caputo M. Lynell Kussman, MD; 06/14/2017 6:39 PM) Medications Reconciled OxyCODONE HCl (5MG/5ML Solution, 5-10 Milliliter Oral every four hours, as needed, Taken starting 06/14/2017) Active. Zofran ODT (4MG Tablet Disint, 1 (one) Tablet Disperse Oral every six hours, as needed, Taken starting 06/14/2017) Active. Neomycin Sulfate (500MG Tablet, 2 (two) Tablet Oral SEE NOTE, Taken starting 06/14/2017) Active. (TAKE TWO TABLETS AT 2 PM, 3 PM, AND 10 PM THE DAY PRIOR TO SURGERY) Flagyl (500MG Tablet, 2 (two) Tablet Oral SEE NOTE, Taken starting 06/14/2017) Active. (Take at 2pm, 3pm, and 10pm the day prior to your colon operation) Albuterol (90MCG/ACT Aerosol Soln, Inhalation) Active. BuPROPion HCl ER (XL) (150MG Tablet ER 24HR, Oral) Active. Cetirizine HCl (10MG Tablet, Oral) Active. Clotrimazole-Betamethasone (1-0.05% Cream, External) Active. Dexlansoprazole (60MG Capsule DR, Oral) Active. Omeprazole (10MG Capsule DR, Oral) Active. TiZANidine HCl (4MG Capsule, Oral) Active.  Family History (Samiksha Pellicano M. Moksha Dorgan, MD; 06/14/2017 6:39 PM) Arthritis Father. Diabetes Mellitus Father. Heart Disease Hypertension     Review of Systems (Erskin Zinda M. Shelton Soler MD; 06/14/2017 6:32 PM) General Present- Night Sweats. HEENT Present- Seasonal Allergies. Gastrointestinal Present- Indigestion. Musculoskeletal Present- Back Pain. Psychiatric Present- Anxiety. All other systems negative  Vitals (Alisha Spillers CMA; 06/14/2017 4:51 PM) 06/14/2017 4:51 PM Weight: 259.4 lb Height: 67in Body Surface Area: 2.26 m Body Mass  Index: 40.63 kg/m  Pulse: 86 (Regular)  BP: 110/78 (Sitting, Left Arm, Standard)      Physical Exam (Jolena Kittle M. Macall Mccroskey MD; 06/14/2017 6:32 PM)  General Mental Status-Alert. General Appearance-Consistent with stated age. Hydration-Well hydrated. Voice-Normal. Note: morbidly obese  Integumentary Note: multiple tattoos, no edema  Head and Neck Head-normocephalic, atraumatic with no lesions or palpable masses. Trachea-midline. Thyroid Gland Characteristics - normal size and consistency.  Eye Eyeball - Bilateral-Extraocular movements intact. Sclera/Conjunctiva - Bilateral-No scleral icterus.  ENMT Note: EARS - normal external ears lips - not dry, not cracked  Chest and Lung Exam Chest and lung exam reveals -quiet, even and easy respiratory effort with no use of accessory muscles and on auscultation, normal breath sounds, no adventitious sounds and normal vocal resonance. Inspection Chest Wall - Normal. Back - normal.  Breast - Did not examine.  Cardiovascular Cardiovascular examination reveals -normal heart sounds, regular rate and rhythm with no murmurs and normal pedal pulses bilaterally.  Abdomen Inspection Inspection of the abdomen reveals - No Hernias. Skin - Scar - no surgical scars. Palpation/Percussion Palpation and Percussion of the abdomen reveal - Soft, Non Tender, No Rebound tenderness, No Rigidity (guarding) and No hepatosplenomegaly. Auscultation Auscultation of the abdomen reveals - Bowel sounds normal.  Peripheral Vascular Upper Extremity Palpation - Pulses bilaterally normal.  Neurologic Neurologic evaluation reveals -alert and oriented x 3 with no impairment of recent or remote memory. Mental Status-Normal.  Neuropsychiatric The patient's mood and affect are described as -normal. Judgment and Insight-insight is appropriate concerning matters relevant to self.  Musculoskeletal Normal Exam - Left-Upper  Extremity Strength Normal and Lower Extremity Strength Normal. Normal Exam - Right-Upper Extremity Strength Normal and Lower Extremity Strength Normal.  Lymphatic Head & Neck  General Head & Neck Lymphatics: Bilateral - Description - Normal. Axillary - Did not examine. Femoral & Inguinal -Note:NO LAD.       Assessment & Plan (Zachary Lovins M. Nixon Kolton MD; 06/14/2017 6:36 PM)  OBESITY (BMI 30-39.9) (E66.9) Impression: The patient meets weight loss surgery criteria. I think the patient would be an acceptable candidate for Laparoscopic Roux-en-Y Gastric bypass.  We had previously discussed the risks, benefits, and long-term outcomes. She had no additional questions regarding today's aspects of bariatric surgery. We reviewed her preoperative workup going over her labs and imaging. We did discuss the findings of a small hiatal hernia. I discussed that we would tests for one intraoperatively. If found to have it we would go ahead and proceed with repair. We discussed what that would involve. I congratulated her on stopping smoking. We will test her urine nicotine 2 ensure compliance. We discussed the long-term ramifications with respect ulcer formation with possible perforation should she resumed smoking. We discussed the probable need for ongoing PPI therapy after surgery because of her history of Barrett's. We discussed the typical after course. She was given her postoperative prescriptions along with her bowel prep today. I encouraged her to contact the office should she have any additional questions  Current Plans Pt Education - EMW_preopbariatric BARRETT'S ESOPHAGUS WITHOUT DYSPLASIA (K22.70) Impression: With her biopsy-proven Barrett's esophagus I am reluctant to recommend a sleep gastrectomy because of the potential for worsening reflux disease; therefore, I recommended Roux-en-Y gastric bypass as this procedure has the best chance of improving her reflux and ameliorating her symptoms as well as  resolving her Barrett's esophagus  DYSLIPIDEMIA (E78.5)  FATTY LIVER (K76.0)  DIABETES MELLITUS TYPE 2 IN OBESE (E11.69)  CHRONIC MIDLINE LOW BACK PAIN WITH LEFT-SIDED SCIATICA (M54.42)  GERD WITH ESOPHAGITIS (K21.0)  HIATAL HERNIA (K44.9) Impression: see above  VITAMIN D DEFICIENCY (E55.9) Impression: She is a mild deficiency of vitamin D. She is artery taking a vitamin D supplement. I recommended that she get at least 1000 international units per day.  TOBACCO USE (Z72.0) Impression: I congratulated her for stopping smoking. I advised her that we will check a urine nicotine level to make sure. If she is still smoking surgery will be canceled  Hawa Henly M. Finbar Nippert, MD, FACS General, Bariatric, & Minimally Invasive Surgery [facility]   

## 2017-07-09 NOTE — Op Note (Signed)
Newt MinionHeather Anne Scripter [id number] 11/06/1975. 07/09/2017  Preoperative diagnosis:    Obesity (BMI 39.9)   Hiatal hernia with GERD   Barrett's esophagus with dysplasia   Diet-controlled type 2 diabetes mellitus (HCC)   Dyslipidemia   Fatty liver disease, nonalcoholic OA lumbar spine   Postoperative  diagnosis:  1. same  Surgical procedure: Laparoscopic Roux-en-Y gastric bypass (ante-colic, ante-gastric) with hiatal hernia repair; upper endoscopy  Surgeon: Atilano InaEric M Suheyla Mortellaro, M.D. FACS  Asst.: Ovidio Kinavid Newman MD FACS  Anesthesia: General plus exparel  Complications: None   EBL: Minimal   Drains: None   Disposition: PACU in good condition   Indications for procedure: 41yo female with morbid obesity who has been unsuccessful at sustained weight loss. The patient's comorbidities are listed above. We discussed the risk and benefits of surgery including but not limited to anesthesia risk, bleeding, infection, blood clot formation, anastomotic leak, anastomotic stricture, ulcer formation, death, respiratory complications, intestinal blockage, internal hernia, gallstone formation, vitamin and nutritional deficiencies, injury to surrounding structures, failure to lose weight and mood changes.   Description of procedure: Patient is brought to the operating room and general anesthesia induced. The patient had received preoperative broad-spectrum IV antibiotics and subcutaneous heparin. The abdomen was widely sterilely prepped with Chloraprep and draped. Patient timeout was performed and correct patient and procedure confirmed. Access was obtained with a 12 mm Optiview trocar in the left upper quadrant and pneumoperitoneum established without difficulty. Under direct vision 12 mm trocars were placed laterally in the right upper quadrant, right upper quadrant midclavicular line, and to the left and above the umbilicus for the camera port. A 5 mm trocar was placed laterally in the left upper quadrant. Exparel  was infiltrated in bilateral lateral upper abdominal walls as a TAP block.  The omentum was brought into the upper abdomen and the transverse mesocolon elevated and the ligament of Treitz clearly identified. A 40 cm biliopancreatic limb was then carefully measured from the ligament of Treitz. The small intestine was divided at this point with a single firing of the white load linear stapler. A Penrose drain was sutured to the end of the Roux-en-Y limb for later identification. A 100 cm Roux-en-Y limb was then carefully measured. At this point a side-to-side anastomosis was created between the Roux limb and the end of the biliopancreatic limb. This was accomplished with a single firing of the 60 mm white load linear stapler. The common enterotomy was closed with a running 2-0 Vicryl begun at either end of the enterotomy and tied centrally. Eviceal tissue sealant was placed over the anastomosis. The mesenteric defect was then closed with running 2-0 silk. The omentum was then divided with the harmonic scalpel up towards the transverse colon to allow mobility of the Roux limb toward the gastric pouch. The patient was then placed in steep reversed Trendelenburg. Through a 5 mm subxiphoid site the [facility] retractor was placed and the left lobe of the liver elevated with excellent exposure of the upper stomach and hiatus.   Her preop ugi showed a small sliding hiatal hernia. There is a small anterior dimple that was obviously visible. The calibration tube was placed in the oropharynx and guided down into the stomach by the CRNA. 10 mL of air was insufflated into the calibration balloon. The calibration tubing was then gently pulled back by the CRNA and it slid past the GE junction. At this point the calibration tubing was desufflated and pulled back into the esophagus. This confirmed my suspicion of a  clinically significant hiatal hernia. The gastrohepatic ligament was incised with harmonic scalpel. The right crus was  identified. We identified the crossing fat along the right crus. The adipose tissue just above this area was incised with harmonic scalpel. I then bluntly dissected out this area and identified the left crus. There was evidence of a hiatal hernia. I then mobilized the esophagus. The left and right crus were further mobilized with blunt dissection. I was then able to reapproximate the left and right crus with 0 Ethibond using an Endostitch suture device and securing it with a titanium tyknot. I placed a second suture in a similar fashion. We then had the CRNA readvanced the calibration tubing back into the stomach. 10 mL of air was insufflated into the calibration tube balloon. The calibration tube was then gently pulled back and there was resistance at the GE junction. The tube did not slide back up into the esophagus. At this point the calibration tubing was deflated and removed from the patient's body.   The angle of Hiss was then mobilized with the harmonic scalpel. A 4 cm gastric pouch was then carefully measured along the lesser curve of the stomach. Dissection was carried along the lesser curve at this point with the Harmonic scalpel working carefully back toward the lesser sac at right angles to the lesser curve. The free lesser sac was then entered. After being sure all tubes were removed from the stomach an initial firing of the gold load 60 mm linear stapler was fired at right angles across the lesser curve for about 4 cm. The gastric pouch was further mobilized posteriorly and then the pouch was completed with 3 further firings of the 60 mm blue load linear stapler up through the previously dissected angle of His. It was ensured that the pouch was completely mobilized away from the gastric remnant. This created a nice tubular 4-5 cm gastric pouch. The Roux limb was then brought up in an antecolic fashion with the candycane facing to the patient's left without undue tension. The gastrojejunostomy was  created with an initial posterior row of 2-0 Vicryl between the Roux limb and the staple line of the gastric pouch. Enterotomies were then made in the gastric pouch and the Roux limb with the harmonic scalpel and at approximately 2-2-1/2 cm anastomosis was created with a single firing of the 60mm blue load linear stapler. The staple line was inspected and was intact without bleeding. The common enterotomy was then closed with running 2-0 Vicryl begun at either end and tied centrally. The Ewall tube was then easily passed through the anastomosis and an outer anterior layer of running 2-0 Vicryl was placed. The Ewald tube was removed. With the outlet of the gastrojejunostomy clamped and under saline irrigation the assistant performed upper endoscopy and with the gastric pouch tensely distended with air-there was no evidence of leak on this test. The pouch was desufflated. The Vonita Mosseterson defect was closed with running 2-0 silk. The abdomen was inspected for any evidence of bleeding or bowel injury and everything looked fine. The Nathanson retractor was removed under direct vision after coating the anastomosis with Eviceal tissue sealant. All CO2 was evacuated and trochars removed. Skin incisions were closed with 4-0 monocryl in a subcuticular fashion followed by Dermabond. Sponge needle and instrument counts were correct. The patient was taken to the PACU in good condition.    Mary SellaEric M. Andrey CampanileWilson, MD, FACS General, Bariatric, & Minimally Invasive Surgery [facility], [place]

## 2017-07-09 NOTE — Anesthesia Procedure Notes (Signed)
Date/Time: 07/09/2017 10:18 AM Performed by: Delphia Grateshandler, Edwing Figley, CRNA Pre-anesthesia Checklist: Suction available, Patient being monitored and Emergency Drugs available Oxygen Delivery Method: Simple face mask Placement Confirmation: positive ETCO2 and breath sounds checked- equal and bilateral Dental Injury: Teeth and Oropharynx as per pre-operative assessment  Comments: Extubated to face mask

## 2017-07-09 NOTE — Discharge Instructions (Signed)
° ° ° °GASTRIC BYPASS/SLEEVE ° Home Care Instructions ° ° These instructions are to help you care for yourself when you go home. ° °Call: If you have any problems. °• Call [phone] and ask for the surgeon on call °• If you need immediate help, come to the ER at [facility].  °• Tell the ER staff that you are a new post-op gastric bypass or gastric sleeve patient °  °Signs and symptoms to report: • Severe vomiting or nausea °o If you cannot keep down clear liquids for longer than 1 day, call your surgeon  °• Abdominal pain that does not get better after taking your pain medication °• Fever over 100.4° F with chills °• Heart beating over 100 beats a minute °• Shortness of breath at rest °• Chest pain °•  Redness, swelling, drainage, or foul odor at incision (surgical) sites °•  If your incisions open or pull apart °• Swelling or pain in calf (lower leg) °• Diarrhea (Loose bowel movements that happen often), frequent watery, uncontrolled bowel movements °• Constipation, (no bowel movements for 3 days) if this happens: Pick one °o Milk of Magnesia, 2 tablespoons by mouth, 3 times a day for 2 days if needed °o Stop taking Milk of Magnesia once you have a bowel movement °o Call your doctor if constipation continues °Or °o Miralax  (instead of Milk of Magnesia) following the label instructions °o Stop taking Miralax once you have a bowel movement °o Call your doctor if constipation continues °• Anything you think is not normal °  °Normal side effects after surgery: • Unable to sleep at night or unable to focus °• Irritability or moody °• Being tearful (crying) or depressed °These are common complaints, possibly related to your anesthesia medications that put you to sleep, stress of surgery, and change in lifestyle.  This usually goes away a few weeks after surgery.  If these feelings continue, call your primary care doctor. °  °Wound Care: You may have surgical glue, steri-strips, or staples over your incisions after  surgery °• Surgical glue:  Looks like a clear film over your incisions and will wear off a little at a time °• Steri-strips: Strips of tape over your incisions. You may notice a yellowish color on the skin under the steri-strips. This is used to make the   steri-strips stick better. Do not pull the steri-strips off - let them fall off °• Staples: Staples may be removed before you leave the hospital °o If you go home with staples, call [facility], [phone] at for an appointment with your surgeon’s nurse to have staples removed 10 days after surgery. °• Showering: You may shower two (2) days after your surgery unless your surgeon tells you differently °o Wash gently around incisions with warm soapy water, rinse well, and gently pat dry  °o No tub baths until staples are removed, steri-strips fall off or glue is gone.  °  °Medications: • Medications should be liquid or crushed if larger than the size of a dime °• Extended release pills (medication that release a little bit at a time through the day) should NOT be crushed or cut. (examples include XL, ER, DR, SR) °• Depending on the size and number of medications you take, you may need to space (take a few throughout the day)/change the time you take your medications so that you do not over-fill your pouch (smaller stomach) °• Make sure you follow-up with your primary care doctor to   make medication changes needed during rapid weight loss and life-style changes °• If you have diabetes, follow up with the doctor that orders your diabetes medication(s) within one week after surgery and check your blood sugar regularly. °• Do not drive while taking prescription pain medication  °• It is ok to take Tylenol by the bottle instructions with your pain medicine or instead of your pain medicine as needed.  DO NOT TAKE NSAIDS (EXAMPLES OF NSAIDS:  IBUPROFREN/ NAPROXEN)  °Diet:                    First 2 Weeks ° You will see the dietician t about two (2) weeks  after your surgery. The dietician will increase the types of foods you can eat if you are handling liquids well: °• If you have severe vomiting or nausea and cannot keep down clear liquids lasting longer than 1 day, call your surgeon @ ([phone]) °Protein Shake °• Drink at least 2 ounces of shake 5-6 times per day °• Each serving of protein shakes (usually 8 - 12 ounces) should have: °o 15 grams of protein  °o And no more than 5 grams of carbohydrate  °• Goal for protein each day: °o Men = 80 grams per day °o Women = 60 grams per day °• Protein powder may be added to fluids such as non-fat milk or Lactaid milk or unsweetened Soy/Almond milk (limit to 35 grams added protein powder per serving) ° °Hydration °• Slowly increase the amount of water and other clear liquids as tolerated (See Acceptable Fluids) °• Slowly increase the amount of protein shake as tolerated  °•  Sip fluids slowly and throughout the day.  Do not use straws. °• May use sugar substitutes in small amounts (no more than 6 - 8 packets per day; i.e. Splenda) ° °Fluid Goal °• The first goal is to drink at least 8 ounces of protein shake/drink per day (or as directed by the nutritionist); some examples of protein shakes are Syntrax Nectar, Adkins Advantage, EAS Edge HP, and Unjury. See handout from pre-op Bariatric Education Class: °o Slowly increase the amount of protein shake you drink as tolerated °o You may find it easier to slowly sip shakes throughout the day °o It is important to get your proteins in first °• Your fluid goal is to drink 64 - 100 ounces of fluid daily °o It may take a few weeks to build up to this °• 32 oz (or more) should be clear liquids  °And  °• 32 oz (or more) should be full liquids (see below for examples) °• Liquids should not contain sugar, caffeine, or carbonation ° °Clear Liquids: °• Water or Sugar-free flavored water (i.e. Fruit H2O, Propel) °• Decaffeinated coffee or tea (sugar-free) °• Crystal Lite, Wyler’s Lite,  Minute Maid Lite °• Sugar-free Jell-O °• Bouillon or broth °• Sugar-free Popsicle:   *Less than 20 calories each; Limit 1 per day ° °Full Liquids: °Protein Shakes/Drinks + 2 choices per day of other full liquids °• Full liquids must be: °o No More Than 15 grams of Carbs per serving  °o No More Than 3 grams of Fat per serving °• Strained low-fat cream soup (except Cream of Potato or Tomato) °• Non-Fat milk °• Fat-free Lactaid Milk °• Unsweetened Soy Or Unsweetened Almond Milk °• Low Sugar yogurt (Dannon Lite & Fit, Greek yogurt; Oikos Triple Zero; Chobani Simply 100; Yoplait 100 calorie Greek - No Fruit on the Bottom) ° °  °Vitamins   and Minerals • Start 1 day after surgery unless otherwise directed by your surgeon °• 2 Chewable Bariatric Specific Multivitamin / Multimineral Supplement with iron (Example: Bariatric Advantage Multi EA) °• Chewable Calcium with Vitamin D-3 °(Example: 3 Chewable Calcium Plus 600 with Vitamin D-3) °o Take 500 mg three (3) times a day for a total of 1500 mg each day °o Do not take all 3 doses of calcium at one time as it may cause constipation, and you can only absorb 500 mg  at a time  °o Do not mix multivitamins containing iron with calcium supplements; take 2 hours apart °• Menstruating women and those with a history of anemia (a blood disease that causes weakness) may need extra iron °o Talk with your doctor to see if you need more iron °• Do not stop taking or change any vitamins or minerals until you talk to your dietitian or surgeon °• Your Dietitian and/or surgeon must approve all vitamin and mineral supplements °  °Activity and Exercise: Limit your physical activity as instructed by your doctor.  It is important to continue walking at home.  During this time, use these guidelines: °• Do not lift anything greater than ten (10) pounds for at least two (2) weeks °• Do not go back to work or drive until your surgeon says you can °• You may have sex when you feel comfortable  °o It is  VERY important for female patients to use a reliable birth control method; fertility often increases after surgery  °o All hormonal birth control will be ineffective for 30 days after surgery due to medications given during surgery a barrier method must be used. °o Do not get pregnant for at least 18 months °• Start exercising as soon as your doctor tells you that you can °o Make sure your doctor approves any physical activity °• Start with a simple walking program °• Walk 5-15 minutes each day, 7 days per week.  °• Slowly increase until you are walking 30-45 minutes per day °Consider joining our BELT program. [phone] or email [email or web address] °  °Special Instructions Things to remember: °• Use your CPAP when sleeping if this applies to you ° °• [facility] has two free Bariatric Surgery Support Groups that meet monthly °o The 3rd Thursday of each month, 6 pm, [facility]  °o The 2nd Friday of each month, 11:45 am in the private dining room in the basement of [facility] °• It is very important to keep all follow up appointments with your surgeon, dietitian, primary care physician, and behavioral health practitioner °• Routine follow up schedule with your surgeon include appointments at 2-3 weeks, 6-8 weeks, 6 months, and 1 year at a minimum.  Your surgeon may request to see you more often.   °o After the first year, please follow up with your bariatric surgeon and dietitian at least once a year in order to maintain best weight loss results °[facility]: [phone] °[facility]: [phone] °Bariatric Nurse Coordinator: [phone] °  °   Reviewed and Endorsed  °by [facility] Patient Education Committee, June, 2016 °Edits Approved: Aug, 2018 ° ° ° °

## 2017-07-09 NOTE — Transfer of Care (Signed)
Immediate Anesthesia Transfer of Care Note  Patient: Tamara MinionHeather [name]  Procedure(s) Performed: LAPAROSCOPIC ROUX-EN-Y GASTRIC BYPASS WITH HIATAL HERNIA REPAIR UPPER ENDO (N/A Abdomen) UPPER GI ENDOSCOPY  Patient Location: PACU  Anesthesia Type:General  Level of Consciousness: awake and alert   Airway & Oxygen Therapy: Patient Spontanous Breathing and Patient connected to face mask oxygen  Post-op Assessment: Report given to RN and Post -op Vital signs reviewed and stable  Post vital signs: Reviewed and stable  Last Vitals:  Vitals:   07/09/17 0520  BP: 116/70  Pulse: 85  Resp: 18  Temp: 36.7 C  SpO2: 97%    Last Pain:  Vitals:   07/09/17 0520  TempSrc: Oral      Patients Stated Pain Goal: 4 (07/09/17 0520)  Complications: No apparent anesthesia complications

## 2017-07-09 NOTE — Progress Notes (Signed)
Pt has had total hysterectomy, order was discontinued.

## 2017-07-09 NOTE — Op Note (Signed)
Name:  Tamara MinionHeather [name] MRN: [id number] Date of Surgery: 07/09/2017  Preop Diagnosis:  Morbid Obesity, S/P RYGB  Postop Diagnosis:  Morbid Obesity, S/P RYGB (Weight - 259, BMI - 40.6)  Procedure:  Upper endoscopy  (Intraoperative)  Surgeon:  Ovidio Kinavid Kentley Cedillo, M.D.  Anesthesia:  GET  Indications for procedure: Tamara MinionHeather [name] is a 41 y.o. female whose primary care physician is White, Arlyss RepressElizabeth Burney, NP and has completed a Roux-en-Y gastric bypass today by Dr. Andrey CampanileWilson.  I am doing an intraoperative upper endoscopy to evaluate the gastric pouch and the gastro-jejunal anastomosis.  Operative Note: The patient is under general anesthesia.  Dr. Andrey CampanileWilson is laparoscoping the patient while I do an upper endoscopy to evaluate the stomach pouch and gastrojejunal anastomosis.  With the patient intubated, I passed the Pentax endoscope without difficulty down the esophagus.  The esophago-gastric junction was at 41 cm.  The gastro-jejunal anastomosis was at 46 cm.  The mucosa of the stomach looked viable and the staple line was intact without bleeding.  The gastro-jejunal anastomosis looked okay.  While I insufflated the stomach pouch with air, Dr. Andrey CampanileWilson clamped off the efferent limb of the jejunum.  He then flooded the upper abdomen with saline to put the gastric pouch and gastro-jejunal anastomosis under saline.  There was no bubbling or evidence of a leak.    The scope was then withdrawn.  The esophagus was unremarkable and the patient tolerated the endoscopy without difficulty.  Ovidio Kinavid Hansford Hirt, MD, [facility] Pager: [phone] Office phone:  [phone]

## 2017-07-10 ENCOUNTER — Encounter (HOSPITAL_COMMUNITY): Payer: Self-pay | Admitting: General Surgery

## 2017-07-10 LAB — CBC WITH DIFFERENTIAL/PLATELET
Basophils Absolute: 0 10*3/uL (ref 0.0–0.1)
Basophils Relative: 0 %
Eosinophils Absolute: 0 10*3/uL (ref 0.0–0.7)
Eosinophils Relative: 0 %
HCT: 36.7 % (ref 36.0–46.0)
Hemoglobin: 11.7 g/dL — ABNORMAL LOW (ref 12.0–15.0)
Lymphocytes Relative: 15 %
Lymphs Abs: 2.4 10*3/uL (ref 0.7–4.0)
MCH: 29.2 pg (ref 26.0–34.0)
MCHC: 31.9 g/dL (ref 30.0–36.0)
MCV: 91.5 fL (ref 78.0–100.0)
Monocytes Absolute: 1.1 10*3/uL — ABNORMAL HIGH (ref 0.1–1.0)
Monocytes Relative: 7 %
Neutro Abs: 12.7 10*3/uL — ABNORMAL HIGH (ref 1.7–7.7)
Neutrophils Relative %: 78 %
Platelets: 379 10*3/uL (ref 150–400)
RBC: 4.01 MIL/uL (ref 3.87–5.11)
RDW: 13.1 % (ref 11.5–15.5)
WBC: 16.2 10*3/uL — ABNORMAL HIGH (ref 4.0–10.5)

## 2017-07-10 LAB — COMPREHENSIVE METABOLIC PANEL
ALT: 59 U/L — ABNORMAL HIGH (ref 14–54)
AST: 55 U/L — ABNORMAL HIGH (ref 15–41)
Albumin: 3.9 g/dL (ref 3.5–5.0)
Alkaline Phosphatase: 60 U/L (ref 38–126)
Anion gap: 7 (ref 5–15)
BUN: 9 mg/dL (ref 6–20)
CO2: 26 mmol/L (ref 22–32)
Calcium: 8.9 mg/dL (ref 8.9–10.3)
Chloride: 107 mmol/L (ref 101–111)
Creatinine, Ser: 0.65 mg/dL (ref 0.44–1.00)
GFR calc Af Amer: >60 mL/min (ref >60)
GFR calc non Af Amer: >60 mL/min (ref >60)
Glucose, Bld: 126 mg/dL — ABNORMAL HIGH (ref 65–99)
Potassium: 4.1 mmol/L (ref 3.5–5.1)
Sodium: 140 mmol/L (ref 135–145)
Total Bilirubin: 0.4 mg/dL (ref 0.3–1.2)
Total Protein: 6.9 g/dL (ref 6.5–8.1)

## 2017-07-10 NOTE — Plan of Care (Signed)
Nutrition Education Note  Received consult for diet education per DROP protocol. Pt's husband will need to buy a supply of vitamins and calcium supplements for patient. Pt waiting to receive monthly subscription of vitamins in the mail.  Discussed 2 week post op diet with pt. Emphasized that liquids must be non carbonated, non caffeinated, and sugar free. Fluid goals discussed. Pt to follow up with outpatient bariatric RD for further diet progression after 2 weeks. Multivitamins and minerals also reviewed. Teach back method used, pt expressed understanding, expect good compliance.   Diet: First 2 Weeks  You will see the nutritionist about two (2) weeks after your surgery. The nutritionist will increase the types of foods you can eat if you are handling liquids well:  If you have severe vomiting or nausea and cannot handle clear liquids lasting longer than 1 day, call your surgeon  Protein Shake  Drink at least 2 ounces of shake 5-6 times per day  Each serving of protein shakes (usually 8 - 12 ounces) should have a minimum of:  15 grams of protein  And no more than 5 grams of carbohydrate  Goal for protein each day:  Men = 80 grams per day  Women = 60 grams per day  Protein powder may be added to fluids such as non-fat milk or Lactaid milk or Soy milk (limit to 35 grams added protein powder per serving)   Hydration  Slowly increase the amount of water and other clear liquids as tolerated (See Acceptable Fluids)  Slowly increase the amount of protein shake as tolerated  Sip fluids slowly and throughout the day  May use sugar substitutes in small amounts (no more than 6 - 8 packets per day; i.e. Splenda)   Fluid Goal  The first goal is to drink at least 8 ounces of protein shake/drink per day (or as directed by the nutritionist); some examples of protein shakes are Premier Protein, ITT IndustriesSyntrax Nectar, Dillard'sdkins Advantage, EAS Edge HP, and Unjury. See handout from pre-op Bariatric Education Class:   Slowly increase the amount of protein shake you drink as tolerated  You may find it easier to slowly sip shakes throughout the day  It is important to get your proteins in first  Your fluid goal is to drink 64 - 100 ounces of fluid daily  It may take a few weeks to build up to this  32 oz (or more) should be clear liquids  And  32 oz (or more) should be full liquids (see below for examples)  Liquids should not contain sugar, caffeine, or carbonation   Clear Liquids:  Water or Sugar-free flavored water (i.e. Fruit H2O, Propel)  Decaffeinated coffee or tea (sugar-free)  Crystal Lite, Wyler?s Lite, Minute Maid Lite  Sugar-free Jell-O  Bouillon or broth  Sugar-free Popsicle: *Less than 20 calories each; Limit 1 per day   Full Liquids:  Protein Shakes/Drinks + 2 choices per day of other full liquids  Full liquids must be:  No More Than 12 grams of Carbs per serving  No More Than 3 grams of Fat per serving  Strained low-fat cream soup  Non-Fat milk  Fat-free Lactaid Milk  Sugar-free yogurt (Dannon Lite & Fit, [place] yogurt, Oikos Zero)   Tilda FrancoLindsey Corie Vavra, MS, RD, LDN [facility] Pager: [phone] After Hours Pager: [phone]

## 2017-07-10 NOTE — Progress Notes (Signed)
Patient ID: [name], female   DOB: 10/17/1975, 41 y.o.   MRN: [id number]    Progress Note: Metabolic and Bariatric Surgery Service   Chief Complaint/Subjective: States she had gas pain yesterday but now gone; has had nausea and some abd pain. Nausea not limiting fluid intake; walked several loops  Objective: Vital signs in last 24 hours: Temp:  [98.2 F (36.8 C)-99.1 F (37.3 C)] 98.4 F (36.9 C) (12/12 0628) Pulse Rate:  [62-96] 62 (12/12 0628) Resp:  [9-20] 18 (12/12 0628) BP: (123-144)/(58-82) 134/73 (12/12 0628) SpO2:  [91 %-99 %] 98 % (12/12 0628) Weight:  [116.6 kg (257 lb 0.9 oz)] 116.6 kg (257 lb 0.9 oz) (12/12 0900) Last BM Date: 07/08/17  Intake/Output from previous day: 12/11 0701 - 12/12 0700 In: 3240.4 [P.O.:480; I.V.:2760.4] Out: 2350 [Urine:2300; Blood:50] Intake/Output this shift: No intake/output data recorded.  Lungs: cta, nonlabored  Cardiovascular: reg  Abd: soft, min TTP, incisions ok, nd  Extremities: no edema  Neuro: nonfocal, nontoxic  Lab Results: CBC  Recent Labs    07/09/17 1440 07/10/17 0606  WBC  --  16.2*  HGB 12.6 11.7*  HCT 38.6 36.7  PLT  --  379   BMET Recent Labs    07/10/17 0606  NA 140  K 4.1  CL 107  CO2 26  GLUCOSE 126*  BUN 9  CREATININE 0.65  CALCIUM 8.9   PT/INR No results for input(s): LABPROT, INR in the last 72 hours. ABG No results for input(s): PHART, HCO3 in the last 72 hours.  Invalid input(s): PCO2, PO2  Studies/Results:  Anti-infectives: Anti-infectives (From admission, onward)   Start     Dose/Rate Route Frequency Ordered Stop   07/09/17 0600  gentamicin (GARAMYCIN) 420 mg, clindamycin (CLEOCIN) 900 mg in dextrose 5 % 100 mL IVPB     233 mL/hr over 30 Minutes Intravenous On call to O.R. 07/08/17 1138 07/09/17 0836      Medications: Scheduled Meds: . acetaminophen (TYLENOL) oral liquid 160 mg/5 mL  650 mg Oral Q6H  . enoxaparin (LOVENOX) injection  30 mg Subcutaneous Q12H  .  gabapentin  200 mg Oral Q12H  . pantoprazole (PROTONIX) IV  40 mg Intravenous QHS  . [START ON 07/11/2017] protein supplement shake  2 oz Oral Q2H   Continuous Infusions: . 0.45 % NaCl with KCl 20 mEq / L 125 mL/hr at 07/09/17 1555   PRN Meds:.albuterol, diphenhydrAMINE, morphine injection, ondansetron (ZOFRAN) IV, oxyCODONE, promethazine, simethicone  Assessment/Plan: Patient Active Problem List   Diagnosis Date Noted  . Dyslipidemia 07/09/2017  . Fatty liver disease, nonalcoholic 07/09/2017  . Morbid obesity (HCC) 07/09/2017  . Dyspareunia 05/28/2016  . Heartburn   . Gastritis without bleeding   . Obesity (BMI 30-39.9) 04/09/2016  . Increased band cell count 03/14/2016  . Diet-controlled type 2 diabetes mellitus (HCC) 03/14/2016  . Barrett's esophagus with dysplasia 09/15/2014  . Trigger thumb of right hand 10/02/2012  . Neuromuscular disorder (HCC) 06/16/2012  . Obesity 06/16/2012  . Numbness and tingling in right hand 03/11/2012  . Numbness 01/24/2012  . Asthma 11/23/2011  . IRRITABLE BOWEL SYNDROME 09/05/2009  . ANXIETY 08/02/2009  . PANIC DISORDER 08/02/2009  . GERD 08/02/2009  . Hiatal hernia with GERD 08/02/2009  . DEGENERATIVE JOINT DISEASE 08/02/2009  . HEADACHE, CHRONIC 08/02/2009  . REFLUX ESOPHAGITIS, HX OF 08/02/2009   s/p Procedure(s): LAPAROSCOPIC ROUX-EN-Y GASTRIC BYPASS WITH HIATAL HERNIA REPAIR UPPER ENDO UPPER GI ENDOSCOPY 07/09/2017  No fever, no tachycardia wbc up a  little but otherwise looks good.  Cont diet as tolerated Cont chemical vte prophylaxis Ambulate, pulm toilet Disposition:  LOS: 1 day  The patient will be in the hospital for normal postop protocol  Gaynelle AduEric Marko Skalski, MD [phone] [facility]

## 2017-07-11 LAB — CBC WITH DIFFERENTIAL/PLATELET
Basophils Absolute: 0 10*3/uL (ref 0.0–0.1)
Basophils Relative: 0 %
EOS ABS: 0 10*3/uL (ref 0.0–0.7)
EOS PCT: 0 %
HCT: 40.4 % (ref 36.0–46.0)
HEMOGLOBIN: 12.9 g/dL (ref 12.0–15.0)
Lymphocytes Relative: 19 %
Lymphs Abs: 2.9 10*3/uL (ref 0.7–4.0)
MCH: 29.5 pg (ref 26.0–34.0)
MCHC: 31.9 g/dL (ref 30.0–36.0)
MCV: 92.4 fL (ref 78.0–100.0)
MONOS PCT: 6 %
Monocytes Absolute: 1 10*3/uL (ref 0.1–1.0)
NEUTROS PCT: 75 %
Neutro Abs: 11.5 10*3/uL — ABNORMAL HIGH (ref 1.7–7.7)
Platelets: 328 10*3/uL (ref 150–400)
RBC: 4.37 MIL/uL (ref 3.87–5.11)
RDW: 13.1 % (ref 11.5–15.5)
WBC: 15.4 10*3/uL — ABNORMAL HIGH (ref 4.0–10.5)

## 2017-07-11 MED ORDER — OXYCODONE HCL 5 MG/5ML PO SOLN: 5.0000 mg | ORAL | 0 refills | Status: DC | PRN

## 2017-07-11 MED FILL — oxyCODONE HCL 5 MG/5ML SOLN: 5 | 2 days supply | Qty: 100 | Fill #0

## 2017-07-11 NOTE — Progress Notes (Signed)
Patient alert and oriented, pain is controlled. Patient is tolerating fluids, advanced to protein shake today, patient is tolerating well. Reviewed Gastric Bypass discharge instructions with patient and patient is able to articulate understanding. Provided information on BELT program, Support Group and [facility] outpatient pharmacy. All questions answered, will continue to monitor.    

## 2017-07-11 NOTE — Progress Notes (Signed)
Patient alert and oriented, Post op day 2.  Provided support and encouragement.  Encouraged pulmonary toilet, ambulation and small sips of liquids.  All questions answered.  Will continue to monitor. 

## 2017-07-11 NOTE — Discharge Summary (Signed)
Physician Discharge Summary  Candelaria Pies ZOX:[id number] DOB: 03-28-1976 DOA: 07/09/2017  PCP: Titus Mould, NP  Admit date: 07/09/2017 Discharge date:  07/11/2017   Recommendations for Outpatient Follow-up:    Follow-up Information    Gaynelle Adu, MD. Go on 08/02/2017.   Specialty:  General Surgery Why:  at 2pm  in the [facility] information: [address] [place] [address] [phone]        Gaynelle Adu, MD Follow up.   Specialty:  General Surgery Contact information: [address] [place] [address] [phone]          Discharge Diagnoses:  Principal Problem:   Obesity (BMI 30-39.9) Active Problems:   Hiatal hernia with GERD   Barrett's esophagus with dysplasia   Diet-controlled type 2 diabetes mellitus (HCC)   Dyslipidemia   Fatty liver disease, nonalcoholic   Morbid obesity (HCC)   Surgical Procedure: Laparoscopic Roux-en-Y gastric bypass with hiatal hernia repair, upper endoscopy  Discharge Condition: Good Disposition: Home  Diet recommendation: Postoperative gastric bypass diet  Filed Weights   07/09/17 0520 07/10/17 0900  Weight: 114.8 kg (253 lb) 116.6 kg (257 lb 0.9 oz)     Hospital Course:  The patient was admitted for a planned laparoscopic Roux-en-Y gastric bypass with hiatal hernia repair. Please see operative note. Preoperatively the patient was given 5000 units of subcutaneous heparin for DVT prophylaxis. ERAS protocol was used. Postoperative prophylactic Lovenox dosing was started on the evening of postoperative day 0.  The patient was started on ice chips and water on the evening of POD 0 which they tolerated. On postoperative day 1 The patient's diet was advanced to protein shakes which they also tolerated. On POD 2, The patient was ambulating without difficulty. Their vital signs are stable without fever or tachycardia. Their hemoglobin had remained stable. The patient had  received discharge instructions and counseling. They were deemed stable for discharge.  She had some nausea on the morning of POD 2 but she had drank a fair amount of shake and it was felt she drank too much at one time.   BP (!) 104/58 (BP Location: Left Arm)   Pulse 80   Temp 98.6 F (37 C) (Oral)   Resp 16   Ht 5\' 7"  (1.702 m)   Wt 116.6 kg (257 lb 0.9 oz)   SpO2 99%   BMI 40.26 kg/m   Gen: alert, NAD, non-toxic appearing Pupils: equal, no scleral icterus Pulm: Lungs clear to auscultation, symmetric chest rise CV: regular rate and rhythm Abd: soft, min tender, nondistended. No cellulitis. No incisional hernia Ext: no edema, no calf tenderness Skin: no rash, no jaundice  Discharge Instructions  Discharge Instructions    Ambulate hourly while awake   Complete by:  As directed    Call MD for:  difficulty breathing, headache or visual disturbances   Complete by:  As directed    Call MD for:  persistant dizziness or light-headedness   Complete by:  As directed    Call MD for:  persistant nausea and vomiting   Complete by:  As directed    Call MD for:  redness, tenderness, or signs of infection (pain, swelling, redness, odor or green/yellow discharge around incision site)   Complete by:  As directed    Call MD for:  severe uncontrolled pain   Complete by:  As directed    Call MD for:  temperature >101 F   Complete by:  As directed    Diet bariatric full liquid   Complete by:  As directed    Discharge instructions   Complete by:  As directed    See bariatric discharge instructions   Incentive spirometry   Complete by:  As directed    Perform hourly while awake     Allergies as of 07/11/2017      Reactions   Penicillins Anaphylaxis   Has patient had a PCN reaction causing immediate rash, facial/tongue/throat swelling, SOB or lightheadedness with hypotension: Yes Has patient had a PCN reaction causing severe rash involving mucus membranes or skin necrosis: Yes Has  patient had a PCN reaction that required hospitalization: Yes Has patient had a PCN reaction occurring within the last 10 years: No If all of the above answers are "NO", then may proceed with Cephalosporin use.   Oxycodone Itching, Nausea Only   "bugs crawling on me" Can take if takes Benadryl first    Prednisone Other (See Comments)   Insomnia,  Unable to sleep    Quinolones Other (See Comments)   Other reaction(s): Other (See Comments) BLURRY VISION   Latex Rash   Latex adhesives tape only per pt.Not allergic to latex gloves or other products USE PAPER TAPE ONLY PLEASE      Medication List    STOP taking these medications   aspirin-acetaminophen-caffeine 250-250-65 MG tablet Commonly known as:  EXCEDRIN MIGRAINE   dexlansoprazole 60 MG capsule Commonly known as:  DEXILANT   ibuprofen 200 MG tablet Commonly known as:  ADVIL,MOTRIN   montelukast 10 MG tablet Commonly known as:  SINGULAIR   omeprazole 40 MG capsule Commonly known as:  PRILOSEC     TAKE these medications   albuterol 108 (90 Base) MCG/ACT inhaler Commonly known as:  PROVENTIL HFA;VENTOLIN HFA Inhale 2 puffs into the lungs every 6 (six) hours as needed for wheezing or shortness of breath. What changed:  Another medication with the same name was removed. Continue taking this medication, and follow the directions you see here.   BIOTIN PO Take 2 tablets by mouth daily. gummys   cholecalciferol 1000 units tablet Commonly known as:  VITAMIN D Take 1,000 Units by mouth daily.   clotrimazole-betamethasone cream Commonly known as:  LOTRISONE Apply 1 application topically 2 (two) times daily as needed.   oxyCODONE 5 MG/5ML solution Commonly known as:  ROXICODONE Take 5-10 mLs (5-10 mg total) by mouth every 4 (four) hours as needed for moderate pain or severe pain.   pantoprazole 40 MG tablet Commonly known as:  PROTONIX Take 40 mg by mouth daily.   vitamin B-12 500 MCG tablet Commonly known as:   CYANOCOBALAMIN Take 2,500 mcg by mouth daily.      Follow-up Information    Gaynelle AduWilson, Beatrix Breece, MD. Go on 08/02/2017.   Specialty:  General Surgery Why:  at 2pm  [facility] Contact information: [address] [place] [address] [phone]        Gaynelle AduWilson, Gazelle Towe, MD Follow up.   Specialty:  General Surgery Contact information: [address] [place] [address] [phone]            The results of significant diagnostics from this hospitalization (including imaging, microbiology, ancillary and laboratory) are listed below for reference.    Significant Diagnostic Studies: No results found.  Labs: Basic Metabolic Panel: Recent Labs  Lab 07/10/17 0606  NA 140  K 4.1  CL 107  CO2 26  GLUCOSE  126*  BUN 9  CREATININE 0.65  CALCIUM 8.9   Liver Function Tests: Recent Labs  Lab 07/10/17 0606  AST 55*  ALT 59*  ALKPHOS 60  BILITOT 0.4  PROT 6.9  ALBUMIN 3.9    CBC: Recent Labs  Lab 07/09/17 1440 07/10/17 0606 07/11/17 0507  WBC  --  16.2* 15.4*  NEUTROABS  --  12.7* 11.5*  HGB 12.6 11.7* 12.9  HCT 38.6 36.7 40.4  MCV  --  91.5 92.4  PLT  --  379 328    CBG: No results for input(s): GLUCAP in the last 168 hours.  Principal Problem:   Obesity (BMI 30-39.9) Active Problems:   Hiatal hernia with GERD   Barrett's esophagus with dysplasia   Diet-controlled type 2 diabetes mellitus (HCC)   Dyslipidemia   Fatty liver disease, nonalcoholic   Morbid obesity (HCC)   Time coordinating discharge: 15 min  Signed:  Atilano InaEric M Kiyani Jernigan, MD [facility] [place] [phone] 07/11/2017, 9:48 AM

## 2017-07-11 NOTE — Progress Notes (Signed)
Reviewed discharge summary and AVS with pt and spouse. Verbalized understanding and questions answered to their satisfaction. Discharged pt home via WC in stable condition w/pain controlled and all belongings.

## 2017-07-12 NOTE — Anesthesia Postprocedure Evaluation (Signed)
Anesthesia Post Note  Patient: Newt MinionHeather Anne Whitham  Procedure(s) Performed: LAPAROSCOPIC ROUX-EN-Y GASTRIC BYPASS WITH HIATAL HERNIA REPAIR UPPER ENDO (N/A Abdomen) UPPER GI ENDOSCOPY     Patient location during evaluation: PACU Anesthesia Type: General Level of consciousness: awake and alert Pain management: pain level controlled Vital Signs Assessment: post-procedure vital signs reviewed and stable Respiratory status: spontaneous breathing, nonlabored ventilation, respiratory function stable and patient connected to nasal cannula oxygen Cardiovascular status: blood pressure returned to baseline and stable Postop Assessment: no apparent nausea or vomiting Anesthetic complications: no    Last Vitals:  Vitals:   07/11/17 0945 07/11/17 1329  BP: (!) 104/58 113/66  Pulse: 80 75  Resp: 16 16  Temp: 37 C 37.1 C  SpO2: 99% 95%    Last Pain:  Vitals:   07/11/17 1329  TempSrc: Oral  PainSc:                  Jiles GarterJACKSON,Zafir Schauer EDWARD

## 2017-07-15 ENCOUNTER — Telehealth (HOSPITAL_COMMUNITY): Payer: Self-pay

## 2017-07-15 NOTE — Telephone Encounter (Addendum)
Follow up with bariatric surgical patient to discuss post discharge questions.  No answer at this time voicemail was full.  Will try to follow up with patient at later time today.  1.  Are you having any pain not relieved by pain medication?  2.  How much fluid total fluid intake have you had in the last 24/48 hours?    3.  How much protein intake have you had in the last 24/48 hours?  4.  Have you had any trouble making urine?  5.  Have you had nausea that has not been relieved by nausea medication?  6.  Are you ambulating every hour?  7.  Are you passing gas or had a BM?  8.  Do you know how to contact [facility]? [facility]? [facility]?  9.  Are you taking your vitamins and calcium without difficulty?  10. Tell me how your incision looks?  Any redness, open incision, or drainage? ______________________________________________________________________ 07/16/2017 1326 Patient sent [facility] the following email:  Hi there. I am doing really well since my surgery. I have been following everything so far that I'm suppose to. I'm getting in my protein and my fluids, taking my vitamins, having bowel movements, walking etc. I'm struggling with the selection on what I can eat. I cannot do cream of chicken, mushroom or celery soup strained anymore ??. Just not liking the taste. So far the only protein shake I like is peach and chocolate. I eat sugar free Jello no problem. I'm 7 days post op and wanted to know if there was anything I can add to my food list. I've done allot of reading and some places allow puree foods 7 days post opp like cottage cheese,  And scrambled eggs. At this point is there Anything I can add to make this a little easier especially with Christmas here. My emotions are all over the place and I'm doing everything according to the discharge and classes but just something extra added would help me more. My next appointment is Dec 27th. Thank you   This [facility] tried to establish a phone communication without  success.  I have responded to the patients email and forwarded concerns to dieticians at [facility].  I instructed patient to not advance from Phase II diet until the Dec 27th appointment and please call the coordinator office with phone number provided to discuss the above questions.

## 2017-07-18 ENCOUNTER — Telehealth: Payer: Self-pay | Admitting: Skilled Nursing Facility1

## 2017-07-18 NOTE — Telephone Encounter (Signed)
Diet not wokring for her. 3 protein shakes, 64 fluid ounces cannot stoamch any thing else. cannto do this shit severla of my frineds have had it, very agitated  Very frustated then increaseing to angry  u have to give me more I have 100% stuck with this mood swings and irraitable I dont do beasn On edge evry day

## 2017-07-18 NOTE — Telephone Encounter (Signed)
Pt called stating the Diet is not wokring for her! Pt states sh eis consuming 3 protein shakes, 64 fluid ounces and having regular bowel movements.Pt states she cannot stoamch any thing else liquid! Pt states she cannot do this shit anymore! Pt states several of my frineds have had it and they are not doing the liquid phase for this long! Pt was very agitated, Very frustated then increaseing to angry. Pt states you have to give me more! Pt states she has 100% stuck with this diet and has had mood swings and ha been very  irraitable Pt states she does not do beans. Pt states she is On edge evrey day.  Dietitian advised she continue on her liquid regimen per surgeon requirements. Pt was not accepting of that.   Dietitian offered the option of refried beans, cottage cheese, low fat cheese, eggs, and tofu.   Pt became pleasant and stated she greatly appreicated that.

## 2017-07-25 ENCOUNTER — Encounter: Payer: PPO | Attending: General Surgery | Admitting: Skilled Nursing Facility1

## 2017-07-25 DIAGNOSIS — Z6839 Body mass index (BMI) 39.0-39.9, adult: Secondary | ICD-10-CM | POA: Insufficient documentation

## 2017-07-25 DIAGNOSIS — E669 Obesity, unspecified: Secondary | ICD-10-CM | POA: Diagnosis not present

## 2017-07-25 DIAGNOSIS — E119 Type 2 diabetes mellitus without complications: Secondary | ICD-10-CM | POA: Diagnosis not present

## 2017-07-25 DIAGNOSIS — Z833 Family history of diabetes mellitus: Secondary | ICD-10-CM | POA: Diagnosis not present

## 2017-07-25 DIAGNOSIS — Z8249 Family history of ischemic heart disease and other diseases of the circulatory system: Secondary | ICD-10-CM | POA: Insufficient documentation

## 2017-07-25 DIAGNOSIS — Z79899 Other long term (current) drug therapy: Secondary | ICD-10-CM | POA: Diagnosis not present

## 2017-07-25 DIAGNOSIS — Z713 Dietary counseling and surveillance: Secondary | ICD-10-CM | POA: Insufficient documentation

## 2017-07-25 DIAGNOSIS — Z8261 Family history of arthritis: Secondary | ICD-10-CM | POA: Diagnosis not present

## 2017-07-29 ENCOUNTER — Encounter: Payer: Self-pay | Admitting: Skilled Nursing Facility1

## 2017-07-29 NOTE — Progress Notes (Signed)
Bariatric Class:  Appt start time: 1530 end time:  1630.  2 Week Post-Operative Nutrition Class  Patient was seen on 07/25/2017 for Post-Operative Nutrition [facility].   Surgery date: 07/09/2017 Surgery type: RYGB Start weight at [facility]: 249.2 Weight today: 242  TANITA  BODY COMP RESULTS  07/25/2017   BMI (kg/m^2) 37.9   Fat Mass (lbs) 126   Fat Free Mass (lbs) 116   Total Body Water (lbs) 85   The following the learning objectives were met by the patient during this course:  Identifies Phase 3A (Soft, High Proteins) Dietary Goals and will begin from 2 weeks post-operatively to 2 months post-operatively  Identifies appropriate sources of fluids and proteins   States protein recommendations and appropriate sources post-operatively  Identifies the need for appropriate texture modifications, mastication, and bite sizes when consuming solids  Identifies appropriate multivitamin and calcium sources post-operatively  Describes the need for physical activity post-operatively and will follow MD recommendations  States when to call healthcare provider regarding medication questions or post-operative complications  Handouts given during class include:  Phase 3A: Soft, High Protein Diet Handout  Follow-Up Plan: Patient will follow-up at [facility] in 6 weeks for 2 month post-op nutrition visit for diet advancement per MD.

## 2017-08-22 DIAGNOSIS — R6889 Other general symptoms and signs: Secondary | ICD-10-CM | POA: Diagnosis not present

## 2017-08-22 DIAGNOSIS — J069 Acute upper respiratory infection, unspecified: Secondary | ICD-10-CM | POA: Diagnosis not present

## 2017-09-09 ENCOUNTER — Encounter: Payer: Self-pay | Admitting: Skilled Nursing Facility1

## 2017-09-09 ENCOUNTER — Encounter: Payer: PPO | Attending: General Surgery | Admitting: Skilled Nursing Facility1

## 2017-09-09 DIAGNOSIS — E119 Type 2 diabetes mellitus without complications: Secondary | ICD-10-CM

## 2017-09-09 DIAGNOSIS — Z713 Dietary counseling and surveillance: Secondary | ICD-10-CM | POA: Diagnosis not present

## 2017-09-09 DIAGNOSIS — Z8261 Family history of arthritis: Secondary | ICD-10-CM | POA: Insufficient documentation

## 2017-09-09 DIAGNOSIS — Z6839 Body mass index (BMI) 39.0-39.9, adult: Secondary | ICD-10-CM | POA: Insufficient documentation

## 2017-09-09 DIAGNOSIS — Z79899 Other long term (current) drug therapy: Secondary | ICD-10-CM | POA: Diagnosis not present

## 2017-09-09 DIAGNOSIS — E669 Obesity, unspecified: Secondary | ICD-10-CM | POA: Insufficient documentation

## 2017-09-09 DIAGNOSIS — Z8249 Family history of ischemic heart disease and other diseases of the circulatory system: Secondary | ICD-10-CM | POA: Insufficient documentation

## 2017-09-09 DIAGNOSIS — Z833 Family history of diabetes mellitus: Secondary | ICD-10-CM | POA: Insufficient documentation

## 2017-09-09 NOTE — Progress Notes (Addendum)
Follow-up visit:  8 Weeks Post-Operative RYGB Surgery  Primary concerns today: Post-operative Bariatric Surgery Nutrition Management.  Pt states she was not having any issues until last week. Pt states she used to be able to eat 3-4 ounces of meat and now only can handle a few bites until feeling she needs to throw up but never actually throwing up. Pt states she feels fine but pushes on her upper abdomen feeling a soar pain.  Pt states she tried tofu and fish and does not like it but does like edamame. Pt states she had shrimp last night and it went fine. Pt states she is having a lot of food aversions and pickiness with the smell bothering her. Pt is wanting to add carbohydrates and fruits (advised to wait). Pt states she will be going to [place] in June and hopes to have all foods added back in.  Pt was talkative   Surgery date: 07/09/2017 Surgery type[facility]YGB [facility] Weight today:  222.8 Weight Change: 19.2  TANITA  BODY COMP RESULTS  07/25/2017 09/09/2017   BMI (kg/m^2) 37.9 34.9   Fat Mass (lbs) 126 110   Fat Free Mass (lbs) 116 112.8   Total Body Water (lbs) 85 82   24-hr recall: B (AM): fairlife protein shake (30g) Snk (AM):  L (PM): fairlife protein shake (30g)  Snk (PM):  D (PM): chicken or shrimp  Snk (PM):   Fluid intake: 54-64 ounces of crystal light water and then 22 ounces of protein shakes  Estimated total protein intake: 60+  Medications: see list Supplementation: bariatric advantage with gross taste and bariatric advantage calcium    Using straws: no Drinking while eating: no Having you been chewing well: yes Chewing/swallowing difficulties: no Changes in vision: no Changes to mood/headaches: no Hair loss/Cahnges to skin/Changes to nails: no Any difficulty focusing or concentrating: no Sweating: no Dizziness/Lightheaded: no Palpitations: no  Carbonated beverages: no N/V/D/C/GAS: having 2-3 bowel movements every day Abdominal Pain:  no Dumping syndrome: no  Recent physical activity:  Stair stepper and treadmill at home and has a Systems analystpersonal trainer and gym membership now: 6 days for 45 minutes   Progress Towards Goal(s):  In progress.  Handouts given during visit include:  Non-starchy vegetables + protein    Nutritional Diagnosis:  [place]-3.3 Overweight/obesity related to past poor dietary habits and physical inactivity as evidenced by patient w/ recent RYGB surgery following dietary guidelines for continued weight loss.  Intervention:  Nutrition counseling. Dietitian educated the pt on advancing her diet to include non-starchy vegetables. Goals: -Malawiturkey kielbasa, egg, frittata, ricotta bake  -Eat your protein first then start in on your vegetables  --Have your protein shake with your multivitamin   Teaching Method Utilized:  Visual Auditory Hands on  Barriers to learning/adherence to lifestyle change: none identified   Demonstrated degree of understanding via:  Teach Back   Monitoring/Evaluation:  Dietary intake, exercise, and body weight.

## 2017-09-09 NOTE — Patient Instructions (Addendum)
-  Malawiturkey Perukielbasa, egg, frittata, ricotta bake, black cherry light and fit yogurt   -Eat your protein first then start in on your vegetables     -Have your protein shake with your multivitamin

## 2017-09-10 DIAGNOSIS — R1013 Epigastric pain: Secondary | ICD-10-CM | POA: Diagnosis not present

## 2017-09-12 ENCOUNTER — Other Ambulatory Visit: Payer: Self-pay | Admitting: General Surgery

## 2017-09-12 DIAGNOSIS — R1013 Epigastric pain: Secondary | ICD-10-CM

## 2017-09-13 ENCOUNTER — Ambulatory Visit
Admission: RE | Admit: 2017-09-13 | Discharge: 2017-09-13 | Disposition: A | Discharge status: Home / Self Care | Payer: PPO | Source: Ambulatory Visit | Attending: General Surgery | Admitting: General Surgery

## 2017-09-13 DIAGNOSIS — R1013 Epigastric pain: Secondary | ICD-10-CM | POA: Diagnosis not present

## 2017-09-13 DIAGNOSIS — K828 Other specified diseases of gallbladder: Secondary | ICD-10-CM | POA: Diagnosis not present

## 2017-09-16 ENCOUNTER — Ambulatory Visit (HOSPITAL_COMMUNITY)
Admission: RE | Admit: 2017-09-16 | Discharge: 2017-09-16 | Disposition: A | Discharge status: Home / Self Care | Payer: PPO | Source: Ambulatory Visit | Attending: General Surgery | Admitting: General Surgery

## 2017-09-16 DIAGNOSIS — R1013 Epigastric pain: Secondary | ICD-10-CM | POA: Insufficient documentation

## 2017-09-16 DIAGNOSIS — E860 Dehydration: Secondary | ICD-10-CM | POA: Insufficient documentation

## 2017-09-16 MED ORDER — ONDANSETRON 4 MG PO TBDP: 4.0000 mg | ORAL_TABLET | ORAL | Status: DC | PRN

## 2017-09-16 MED ORDER — SODIUM CHLORIDE 0.9 % IV SOLN: INTRAVENOUS | Status: DC

## 2017-09-16 MED ORDER — DIPHENHYDRAMINE HCL 50 MG/ML IJ SOLN: 12.5000 mg | INTRAMUSCULAR | Status: DC | PRN

## 2017-09-16 MED ORDER — ACETAMINOPHEN 325 MG RE SUPP
325.0000 mg | RECTAL | Status: DC | PRN
  Filled 2017-09-16: qty 2

## 2017-09-16 MED ORDER — ACETAMINOPHEN 325 MG PO TABS: 325.0000 mg | ORAL_TABLET | ORAL | Status: DC | PRN

## 2017-09-16 MED ORDER — DIPHENHYDRAMINE HCL 25 MG PO CAPS: 25.0000 mg | ORAL_CAPSULE | Freq: Four times a day (QID) | ORAL | Status: DC | PRN

## 2017-09-16 MED ORDER — ACETAMINOPHEN 160 MG/5ML PO SOLN
325.0000 mg | ORAL | Status: DC | PRN
  Filled 2017-09-16: qty 20.3

## 2017-09-16 MED ORDER — SODIUM CHLORIDE 0.9 % IV BOLUS (SEPSIS)
1000.0000 mL | Freq: Once | INTRAVENOUS | Status: AC
  Administered 2017-09-16: 1000 mL via INTRAVENOUS

## 2017-09-16 MED ORDER — THIAMINE HCL 100 MG/ML IJ SOLN
INTRAVENOUS | Status: DC
  Administered 2017-09-16: 09:00:00 via INTRAVENOUS
  Filled 2017-09-16 (×4): qty 1000

## 2017-09-16 MED ORDER — ONDANSETRON HCL 4 MG/2ML IJ SOLN
4.0000 mg | INTRAMUSCULAR | Status: DC | PRN
Start: 2017-09-16 — End: 2017-09-17

## 2017-09-16 MED ORDER — PANTOPRAZOLE SODIUM 40 MG PO TBEC
40.0000 mg | DELAYED_RELEASE_TABLET | Freq: Every day | ORAL | Status: DC
  Administered 2017-09-16: 40 mg via ORAL
  Filled 2017-09-16: qty 1

## 2017-09-16 NOTE — Progress Notes (Signed)
PATIENT CARE CENTER NOTE  Diagnosis: Dehydration    Provider: Dr. Andrey CampanileWilson    Procedure: Infusion of Banana Bag and 1 Liter bolus of 0.9% Normal Saline   Note: Patient received infusion of Banana Bag (Sodium Chloride 0.9% with thiamine 100 mg, folic acid 1 mg and multivitamins) and 1 Liter Bolus of 0.9% Normal Saline. Patient tolerated infusion well with no adverse reaction. Discharge instructions given to patient. Patient alert, oreinted and ambulatory at discharge.

## 2017-09-16 NOTE — Discharge Instructions (Signed)
Patient received infusion of Sodium Chloride 0.9% 1,000 mL with thiamine 100 mg, folic acid 1 mg, multivitamins (Banana Bag) and 1 Liter bolus of Sodium Chloride 0.9%.

## 2017-09-19 ENCOUNTER — Ambulatory Visit: Payer: Self-pay | Admitting: General Surgery

## 2017-10-01 ENCOUNTER — Encounter (HOSPITAL_COMMUNITY): Payer: Self-pay | Admitting: Emergency Medicine

## 2017-10-01 ENCOUNTER — Other Ambulatory Visit: Payer: Self-pay

## 2017-10-03 ENCOUNTER — Ambulatory Visit (HOSPITAL_COMMUNITY): Payer: PPO | Admitting: Anesthesiology

## 2017-10-03 ENCOUNTER — Encounter (HOSPITAL_COMMUNITY)
Admission: RE | Disposition: A | Discharge status: Home / Self Care | Payer: Self-pay | Source: Ambulatory Visit | Attending: General Surgery

## 2017-10-03 ENCOUNTER — Ambulatory Visit (HOSPITAL_COMMUNITY)
Admission: RE | Admit: 2017-10-03 | Discharge: 2017-10-03 | Disposition: A | Discharge status: Home / Self Care | Payer: PPO | Source: Ambulatory Visit | Attending: General Surgery | Admitting: General Surgery

## 2017-10-03 ENCOUNTER — Other Ambulatory Visit: Payer: Self-pay

## 2017-10-03 ENCOUNTER — Encounter (HOSPITAL_COMMUNITY): Payer: Self-pay

## 2017-10-03 DIAGNOSIS — K2270 Barrett's esophagus without dysplasia: Secondary | ICD-10-CM | POA: Diagnosis not present

## 2017-10-03 DIAGNOSIS — E559 Vitamin D deficiency, unspecified: Secondary | ICD-10-CM | POA: Insufficient documentation

## 2017-10-03 DIAGNOSIS — Z9884 Bariatric surgery status: Secondary | ICD-10-CM | POA: Insufficient documentation

## 2017-10-03 DIAGNOSIS — R1013 Epigastric pain: Secondary | ICD-10-CM | POA: Diagnosis not present

## 2017-10-03 DIAGNOSIS — K2970 Gastritis, unspecified, without bleeding: Secondary | ICD-10-CM | POA: Diagnosis not present

## 2017-10-03 DIAGNOSIS — E785 Hyperlipidemia, unspecified: Secondary | ICD-10-CM | POA: Insufficient documentation

## 2017-10-03 DIAGNOSIS — F419 Anxiety disorder, unspecified: Secondary | ICD-10-CM | POA: Insufficient documentation

## 2017-10-03 DIAGNOSIS — Z79899 Other long term (current) drug therapy: Secondary | ICD-10-CM | POA: Insufficient documentation

## 2017-10-03 DIAGNOSIS — Z6834 Body mass index (BMI) 34.0-34.9, adult: Secondary | ICD-10-CM | POA: Insufficient documentation

## 2017-10-03 DIAGNOSIS — K219 Gastro-esophageal reflux disease without esophagitis: Secondary | ICD-10-CM | POA: Diagnosis not present

## 2017-10-03 DIAGNOSIS — K760 Fatty (change of) liver, not elsewhere classified: Secondary | ICD-10-CM | POA: Insufficient documentation

## 2017-10-03 DIAGNOSIS — E1169 Type 2 diabetes mellitus with other specified complication: Secondary | ICD-10-CM | POA: Diagnosis not present

## 2017-10-03 DIAGNOSIS — K22719 Barrett's esophagus with dysplasia, unspecified: Secondary | ICD-10-CM | POA: Diagnosis not present

## 2017-10-03 DIAGNOSIS — K449 Diaphragmatic hernia without obstruction or gangrene: Secondary | ICD-10-CM | POA: Diagnosis not present

## 2017-10-03 DIAGNOSIS — K289 Gastrojejunal ulcer, unspecified as acute or chronic, without hemorrhage or perforation: Secondary | ICD-10-CM | POA: Insufficient documentation

## 2017-10-03 DIAGNOSIS — E669 Obesity, unspecified: Secondary | ICD-10-CM | POA: Diagnosis not present

## 2017-10-03 HISTORY — PX: ESOPHAGOGASTRODUODENOSCOPY: SHX5428

## 2017-10-03 SURGERY — EGD (ESOPHAGOGASTRODUODENOSCOPY)
Anesthesia: Monitor Anesthesia Care

## 2017-10-03 MED ORDER — PANTOPRAZOLE SODIUM 40 MG PO TBEC: 40.0000 mg | DELAYED_RELEASE_TABLET | Freq: Two times a day (BID) | ORAL | 1 refills | Status: DC

## 2017-10-03 MED ORDER — PROPOFOL 500 MG/50ML IV EMUL
INTRAVENOUS | Status: DC | PRN
  Administered 2017-10-03: 200 ug/kg/min via INTRAVENOUS
  Administered 2017-10-03: 175 ug/kg/min via INTRAVENOUS

## 2017-10-03 MED ORDER — SUCRALFATE 1 GM/10ML PO SUSP: 1.0000 g | Freq: Four times a day (QID) | ORAL | 1 refills | Status: DC

## 2017-10-03 MED ORDER — LIDOCAINE 2% (20 MG/ML) 5 ML SYRINGE
INTRAMUSCULAR | Status: DC | PRN
  Administered 2017-10-03: 100 mg via INTRAVENOUS

## 2017-10-03 MED ORDER — PROPOFOL 10 MG/ML IV BOLUS
INTRAVENOUS | Status: DC | PRN
  Administered 2017-10-03: 50 mg via INTRAVENOUS
  Administered 2017-10-03: 20 mg via INTRAVENOUS

## 2017-10-03 MED ORDER — PROPOFOL 10 MG/ML IV BOLUS
INTRAVENOUS | Status: AC
  Filled 2017-10-03: qty 40

## 2017-10-03 MED ORDER — SODIUM CHLORIDE 0.9 % IV SOLN: INTRAVENOUS | Status: DC

## 2017-10-03 MED ORDER — LACTATED RINGERS IV SOLN
INTRAVENOUS | Status: DC | PRN
  Administered 2017-10-03: 08:00:00 via INTRAVENOUS

## 2017-10-03 MED ORDER — ONDANSETRON HCL 4 MG/2ML IJ SOLN
INTRAMUSCULAR | Status: DC | PRN
  Administered 2017-10-03: 4 mg via INTRAVENOUS

## 2017-10-03 NOTE — Anesthesia Postprocedure Evaluation (Signed)
Anesthesia Post Note  Patient: [name]  Procedure(s) Performed: ESOPHAGOGASTRODUODENOSCOPY (EGD) WITH POSSIBLE BIOPSY (N/A )     Patient location during evaluation: PACU Anesthesia Type: MAC Level of consciousness: awake and alert Pain management: pain level controlled Vital Signs Assessment: post-procedure vital signs reviewed and stable Respiratory status: spontaneous breathing, nonlabored ventilation, respiratory function stable and patient connected to nasal cannula oxygen Cardiovascular status: stable and blood pressure returned to baseline Postop Assessment: no apparent nausea or vomiting Anesthetic complications: no    Last Vitals:  Vitals:   10/03/17 0910 10/03/17 0918  BP: 113/67 116/74  Pulse: 68 68  Resp: 14 16  Temp:    SpO2: 96% 98%    Last Pain:  Vitals:   10/03/17 0858  TempSrc: Leonard Schwartz

## 2017-10-03 NOTE — Transfer of Care (Signed)
Immediate Anesthesia Transfer of Care Note  Patient: [name]  Procedure(s) Performed: ESOPHAGOGASTRODUODENOSCOPY (EGD) WITH POSSIBLE BIOPSY (N/A )  Patient Location: PACU  Anesthesia Type:MAC  Level of Consciousness: awake, alert  and oriented  Airway & Oxygen Therapy: Patient Spontanous Breathing and Patient connected to nasal cannula oxygen  Post-op Assessment: Report given to RN and Post -op Vital signs reviewed and stable  Post vital signs: Reviewed and stable  Last Vitals:  Vitals:   10/03/17 0736  BP: 113/74  Pulse: 72  Resp: 20  Temp: 36.6 C  SpO2: 96%    Last Pain:  Vitals:   10/03/17 0736  TempSrc: Oral         Complications: No apparent anesthesia complications

## 2017-10-03 NOTE — Anesthesia Preprocedure Evaluation (Signed)
Anesthesia Evaluation  Patient identified by MRN, date of birth, ID band Patient awake    Reviewed: Allergy & Precautions, H&P , NPO status , Patient's Chart, lab work & pertinent test results  Airway Mallampati: III  TM Distance: >3 FB Neck ROM: full    Dental no notable dental hx.    Pulmonary asthma , former smoker,    Pulmonary exam normal breath sounds clear to auscultation       Cardiovascular negative cardio ROS   Rhythm:regular Rate:Normal     Neuro/Psych  Headaches, Anxiety    GI/Hepatic Neg liver ROS, GERD  ,S/p gastric bypass   Endo/Other  diabetes  Renal/GU negative Renal ROS     Musculoskeletal   Abdominal   Peds  Hematology negative hematology ROS (+)   Anesthesia Other Findings   Reproductive/Obstetrics                             Anesthesia Physical  Anesthesia Plan  ASA: III  Anesthesia Plan: MAC   Post-op Pain Management:    Induction: Intravenous  PONV Risk Score and Plan: 2 and Ondansetron, Treatment may vary due to age or medical condition and Propofol infusion  Airway Management Planned: Natural Airway and Nasal Cannula  Additional Equipment:   Intra-op Plan:   Post-operative Plan:   Informed Consent: I have reviewed the patients History and Physical, chart, labs and discussed the procedure including the risks, benefits and alternatives for the proposed anesthesia with the patient or authorized representative who has indicated his/her understanding and acceptance.     Plan Discussed with: CRNA and Surgeon  Anesthesia Plan Comments: (  )        Anesthesia Quick Evaluation

## 2017-10-03 NOTE — Op Note (Signed)
[facility] Patient Name: [name] Procedure Date: 10/03/2017 MRN: [id number] Attending MD: Atilano Ina MD, MD Date of Birth: 1976-07-28 CSN: [id number] Age: 42 Admit Type: Outpatient Procedure:                Upper GI endoscopy Indications:              Epigastric abdominal pain, Suspected esophageal                            reflux Providers:                Mary Sella. Andrey Campanile MD, MD, Bonney Leitz, Kandice Robinsons, Technician Referring MD:              Medicines:                Monitored Anesthesia Care Complications:            No immediate complications. Estimated blood loss:                            None. Estimated Blood Loss:     Estimated blood loss: none. Procedure:                Pre-Anesthesia Assessment:                           - Prior to the procedure, a History and Physical                            was performed, and patient medications and                            allergies were reviewed. The patient's tolerance of                            previous anesthesia was also reviewed. The risks                            and benefits of the procedure and the sedation                            options and risks were discussed with the patient.                            All questions were answered, and informed consent                            was obtained. Prior Anticoagulants: The patient has                            taken no previous anticoagulant or antiplatelet                            agents. ASA Grade Assessment: II - A  patient with                            mild systemic disease. After reviewing the risks                            and benefits, the patient was deemed in                            satisfactory condition to undergo the procedure.                           After obtaining informed consent, the endoscope was                            passed under direct vision. Throughout the               procedure, the patient's blood pressure, pulse, and                            oxygen saturations were monitored continuously. The                            Endoscope was introduced through the mouth, and                            advanced to the afferent and efferent jejunal                            loops. The upper GI endoscopy was accomplished                            without difficulty. The patient tolerated the                            procedure well. Scope In: Scope Out: Findings:      The proximal and mid esophagus was normal. she had a irregular Zline at       43 cm      widely patent gastro-jejunal anastomosis. exposed staples at G-J       anastomosis but surrounding mucosa around exposed staples was normal.       had a 4mm shallow marginal ulcer just at anastomosis on pouch side.       anastomosis at 48 cm      normal appearing roux limb; very short 'candy cane' limb (blind end of       roux limb) Impression:               - Normal proximal/mid esophagus.                           - irregular z line c/w history of Barrett's                           - post operative Gastric bypass anatomy                           -  Small marginal ulcer on pouch side                           - Some exposed staples at G-J anastomosis                           - No specimens collected. Moderate Sedation:      Moderate (conscious) sedation was personally administered by an       anesthesia professional. The following parameters were monitored: oxygen       saturation, heart rate, blood pressure, respiratory rate, EKG, adequacy       of pulmonary ventilation, and response to care. Total physician       intraservice time was 14 minutes. Recommendation:           - Patient has a contact number available for                            emergencies. The signs and symptoms of potential                            delayed complications were discussed with the                             patient. Return to normal activities tomorrow.                            Written discharge instructions were provided to the                            patient.                           - Resume previous diet.                           - Continue present medications. change carafate                            tablet to carafate suspension. Procedure Code(s):        --- Professional ---                           [phone], Esophagogastroduodenoscopy, flexible,                            transoral; diagnostic, including collection of                            specimen(s) by brushing or washing, when performed                            (separate procedure) Diagnosis Code(s):        --- Professional ---                           R10.13, Epigastric pain CPT copyright 2016 American Medical Association. All rights reserved.  The codes documented in this report are preliminary and upon coder review may  be revised to meet current compliance requirements. Gaynelle Adu, MD Atilano Ina MD, MD 10/03/2017 9:05:22 AM This report has been signed electronically. Number of Addenda: 0

## 2017-10-03 NOTE — Interval H&P Note (Signed)
History and Physical Interval Note:  10/03/2017 8:22 AM  [name]  has presented today for surgery, with the diagnosis of Epigastric pain; history of Roux-en-Y gastric bypass  The various methods of treatment have been discussed with the patient and family. After consideration of risks, benefits and other options for treatment, the patient has consented to  Procedure(s): ESOPHAGOGASTRODUODENOSCOPY (EGD) WITH POSSIBLE BIOPSY (N/A) as a surgical intervention .  The patient's history has been reviewed, patient examined, no change in status, stable for surgery.  I have reviewed the patient's chart and labs.  Questions were answered to the patient's satisfaction.    Mary SellaEric M. Andrey CampanileWilson, MD, FACS General, Bariatric, & Minimally Invasive Surgery [facility] [place]  Gaynelle AduEric Kailie Polus

## 2017-10-03 NOTE — H&P (Signed)
Joni Fears Documented: 09/10/2017 1:34 PM Location: [place] Office Patient #: [id number] DOB: Jan 16, 1976 Married / Language: [place] / Race: White Female   History of Present Illness Minerva Areola M. Earnest Mcgillis MD; 09/10/2017 4:08 PM) The patient is a 42 year old female presenting status-post bariatric surgery. She comes in for follow-up after undergoing laparoscopic Roux-en-Y gastric bypass with hiatal hernia repair on July 09 2017. Her initial visit weight was 242 pounds. Her preoperative weight was 259 pounds. last seen 08/02/17 and weight was 238 lbs. She states that she is doing well except having epigastric discomfort. She denies any fever, chills, vomiting. She is getting in more than 60 ounces of liquid per day. She is using the app to track her liquid as well as food choices. She is taking her supplements. She denies any lightheadedness or dizziness. She reports normal urination. She denies any constipation.  She reports that she will have epigastric discomfort with solids. She was doing quite well with most solids without any issues until a few weeks ago. She used to Take 5-7 bites before getting full now she will take 2-3 bites of solid and then she will have nausea and epigastric discomfort. It'll radiate to both sides. She does not have this sensation with liquids. She did see the nutritionist yesterday. She denies any NSAID use. She denies any tobacco use. She denies any flushing, palpitations, or diarrhea with these symptoms. She describes as a cramp like sensation. It'll generally occur fairly quickly with eating solids and may last several hours but the intensity will let up. She states that it similar to how she fell when her gallbladder flared 3 years ago.   Problem List/Past Medical Minerva Areola M. 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 Andrey Campanile, MD; 09/10/2017 4:12 PM) Oral Surgery  Tonsillectomy  Hysterectomy (not due to cancer) - Complete  Spinal Surgery - Lower Back   Diagnostic Studies History Minerva Areola M. Andrey Campanile, MD; 09/10/2017 4:11 PM) Mammogram   Allergies Minerva Areola M. Andrey Campanile, MD; 09/10/2017 4:12 PM) Penicillins  Anaphylaxis. Avelox *FLUOROQUINOLONES*  PredniSONE (Pak) *CORTICOSTEROIDS*  Roxicodone *ANALGESICS - OPIOID*   Medication History Minerva Areola M. Andrey Campanile, MD; 09/10/2017 4:12 PM) Ondansetron (4MG  Tablet Disint, 1 (one) Oral every eight hours, as needed, Taken starting 09/10/2017) Active. Sucralfate (1GM Tablet, 1 (one) Oral before every meal, Taken starting 09/10/2017) Active. Protonix (40MG  Tablet DR, 1 (one) Oral daily, Taken starting 06/14/2017) Active. Nascobal (500MCG/0.1ML Solution, 1 (one) Nasal in one nostril, one time a week, Taken starting 07/05/2017) Active. (Dispense #1 pack containing 4 single use nasal spray device) Multivitamin Adult (1 (one) Oral daily, Taken starting 08/02/2017) Active. (BariActiv Multivitamin, Calcium+D3 and Magnesium; Iron + Vitamin C Chewables)  Family History Minerva Areola M. Andrey Campanile, MD; 09/10/2017 4:12 PM) Arthritis  Father. Diabetes Mellitus  Father. Heart Disease  Hypertension   Vitals (Janette Ranson CMA; 09/10/2017 1:35 PM) 09/10/2017 1:34 PM Weight: 222 lb Height: 67in Body Surface Area: 2.11 m Body Mass Index: 34.77 kg/m  Pulse: 80 (Regular)  BP: 120/78 (Sitting, Left  Arm, Standard)       Physical Exam Minerva Areola(Acea Yagi M. Julicia Krieger MD; 09/10/2017 4:05  PM) General Mental Status-Alert. General Appearance-Consistent with stated age. Hydration-Well hydrated. Voice-Normal.  Chest and Lung Exam Chest and lung exam reveals -quiet, even and easy respiratory effort with no use of accessory muscles. Inspection Chest Wall - Normal. Back - normal. Auscultation Breath sounds - Normal.  Cardiovascular Auscultation Rhythm - Regular.  Abdomen Inspection Skin - Scar - Note: Incision: well healed. very mild epigastric TTP. Palpation/Percussion Palpation and Percussion of the abdomen reveal - Soft, Non Tender, No Rebound tenderness and No Rigidity (guarding). Auscultation Auscultation of the abdomen reveals - Bowel sounds normal.    Assessment & Plan Minerva Areola(Jacoya Bauman M. Joash Tony MD; 09/10/2017 4:11 PM) GASTRIC BYPASS STATUS FOR OBESITY (Z98.84) Story: with hiatal hernia repair July 09, 2017, initial visit weight 242 pounds; preoperative weight 259 pounds Impression: Doing ok. Fluid intake is great. reDiscussed proper eating techniques and behaviors. re Discussed the importance of supplements. Discussed importance of follow-up with me in the dietitian. f/u 2 mo or sooner if symptoms worsen (more pain, pain with liquids, etc) Current Plans Restarted Ondansetron 4 MG Oral Tablet Disintegrating, 1 (one) Tablet every eight hours, as needed, #20, 09/10/2017, Ref. x1. Pt Education - EMW_bariatric followup Follow up with us in the office in 2 months.  Call us sooner as needed.  Follow up if no improvement or if symptoms worsen OBESITY (BMI 30-39.9) (E66.9) Impression: 37 lb weight loss EPIGASTRIC PAIN (R10.13) Impression: Etiology of her epigastric discomfort was solids is not entirely clear. The two most common things in the differential would be some type of marginal ulcer versus gallbladder pathology. She denies NSAID and tobacco use. The fact that eating makes it worse is more consistent with an ulcer or gastritis. I will refill her Zofran.  We will check a comprehensive metabolic panel and check a right upper quadrant ultrasound. I will also start her empirically on Carafate. I highly recommend that she crush both the Protonix and Carafate and make it into a slurry. If gallbladder workup is negative and she is still symptomatic despite Carafate in Protonix this will prompt an upper endoscopy. I instructed her what to call for Current Plans METABOLIC PANEL, COMPREHENSIVE (13086(80053) LIPASE (57846(83690) AMYLASE (96295(82150) Started Sucralfate 1 GM Oral Tablet, 1 (one) Tablet before every meal, #60, 09/10/2017, Ref. Gwyneth Sproutx1.  Texanna Hilburn M. Andrey CampanileWilson, MD, FACS General, Bariatric, & Minimally Invasive Surgery [facility] [place]

## 2017-10-03 NOTE — Discharge Instructions (Signed)
Continue to take protonix twice - crush and mix with water Take liquid prescribed carafate as instructed If your liquid intake worsens please let us know as soon as possible  By taking the protonix crushed and the liquid carafate this will help the ulcer heal but it will take time   Esophagogastroduodenoscopy, Care After Refer to this sheet in the next few weeks. These instructions provide you with information about caring for yourself after your procedure. Your health care provider may also give you more specific instructions. Your treatment has been planned according to current medical practices, but problems sometimes occur. Call your health care provider if you have any problems or questions after your procedure. What can I expect after the procedure? After the procedure, it is common to have:  A sore throat.  Nausea.  Bloating.  Dizziness.  Fatigue.  Follow these instructions at home:  Do not eat or drink anything until the numbing medicine (local anesthetic) has worn off and your gag reflex has returned. You will know that the local anesthetic has worn off when you can swallow comfortably.  Do not drive for 24 hours if you received a medicine to help you relax (sedative).  If your health care provider took a tissue sample for testing during the procedure, make sure to get your test results. This is your responsibility. Ask your health care provider or the department performing the test when your results will be ready.  Keep all follow-up visits as told by your health care provider. This is important. Contact a health care provider if:  You cannot stop coughing.  You are not urinating.  You are urinating less than usual. Get help right away if:  You have trouble swallowing.  You cannot eat or drink.  You have throat or chest pain that gets worse.  You are dizzy or light-headed.  You faint.  You have nausea or vomiting.  You have chills.  You have a  fever.  You have severe abdominal pain.  You have black, tarry, or bloody stools. This information is not intended to replace advice given to you by your health care provider. Make sure you discuss any questions you have with your health care provider. Document Released: 07/02/2012 Document Revised: 12/22/2015 Document Reviewed: 06/09/2015 Elsevier Interactive Patient Education  [employer]2018 Elsevier Inc.

## 2017-11-11 ENCOUNTER — Encounter: Payer: PPO | Attending: General Surgery | Admitting: Registered"

## 2017-11-11 ENCOUNTER — Encounter: Payer: Self-pay | Admitting: Registered"

## 2017-11-11 DIAGNOSIS — Z833 Family history of diabetes mellitus: Secondary | ICD-10-CM | POA: Diagnosis not present

## 2017-11-11 DIAGNOSIS — Z8249 Family history of ischemic heart disease and other diseases of the circulatory system: Secondary | ICD-10-CM | POA: Diagnosis not present

## 2017-11-11 DIAGNOSIS — Z713 Dietary counseling and surveillance: Secondary | ICD-10-CM | POA: Insufficient documentation

## 2017-11-11 DIAGNOSIS — E119 Type 2 diabetes mellitus without complications: Secondary | ICD-10-CM | POA: Insufficient documentation

## 2017-11-11 DIAGNOSIS — Z8261 Family history of arthritis: Secondary | ICD-10-CM | POA: Insufficient documentation

## 2017-11-11 DIAGNOSIS — E669 Obesity, unspecified: Secondary | ICD-10-CM | POA: Insufficient documentation

## 2017-11-11 DIAGNOSIS — Z6839 Body mass index (BMI) 39.0-39.9, adult: Secondary | ICD-10-CM | POA: Diagnosis not present

## 2017-11-11 DIAGNOSIS — Z79899 Other long term (current) drug therapy: Secondary | ICD-10-CM | POA: Diagnosis not present

## 2017-11-11 NOTE — Patient Instructions (Addendum)
Goals:  Follow Phase V: High Protein + All Vegetables  Eat 3-6 small meals/snacks, every 3-5 hrs  Increase lean protein foods to meet 60g goal  Increase fluid intake to 64oz +  Avoid drinking 15 minutes before, during and 30 minutes after eating  Aim for >30 min of physical activity daily  

## 2017-11-11 NOTE — Progress Notes (Signed)
Follow-up visit: 4 Months Post-Operative RYGB Surgery  Primary concerns today: Post-operative Bariatric Surgery Nutrition Management.   Pt states she has an ulcer, currently taking medication. Pt states she will see surgeon tomorrow to find out if she will need more testing and/or surgery. Pt states she is 8 months smoke-free. Pt states she has a piece of dark chocolate once every other day. Pt states she has tried banana because she was getting bored with her diet. Pt states ulcer is keeping her from eating certain foods. Pt states she has good days and bad days related to ulcer; drinks shakes mostly on bad days to continue meeting protein goals. Pt states she can tolerate eggs, salad, rice, and bananas. Pt sates medication is not helping ulcer. Pt states she does not want to be limited especially when eating out and order items other than protein and non-starchy vegetables. Pt states she walks on her treadmill about 3x/day because she loves to walk. Pt states surgeon told her she has a staple showing in her body. Pt states she wants to add more food groups to her diet. Pt states she wants to have some alcohol when she goes to [place] in June. Pt states she does not want to eat sugar or french fries because she is afraid she will regain weight and become fat again. Pt states does not want to be fat again. RD asked pt about mental health and pt states she does not need it as this point because her mind is fine.   Pt states she was not having any issues until last week. Pt states she used to be able to eat 3-4 ounces of meat and now only can handle a few bites until feeling she needs to throw up but never actually throwing up. Pt states she feels fine but pushes on her upper abdomen feeling a soar pain.  Pt states she tried tofu and fish and does not like it but does like edamame. Pt states she had shrimp last night and it went fine. Pt states she is having a lot of food aversions and pickiness with the smell  bothering her. Pt is wanting to add carbohydrates and fruits (advised to wait). Pt states she will be going to [place] in June and hopes to have all foods added back in.  Pt was talkative   Non-scale victories: wearing sizes 12/14 from sizes 22, no longer wearing plus sizes, went dancing with friends and less winded, biking with husband on the weekends  Surgery date: 07/09/2017 Surgery type: RYGB [facility] Weight today:  201.4 Weight Change: 21.4 lbs loss from 222.8 lbs (09/09/2017) Total weight loss: 47.8 lbs loss Weight loss goal: did not want to shop in plus sizes anymore, didn't want family to ridicule her about her weight and size, increase energy  TANITA  BODY COMP RESULTS  07/25/2017 09/09/2017 11/11/2017   BMI (kg/m^2) 37.9 34.9 31.5   Fat Mass (lbs) 126 110 92.8   Fat Free Mass (lbs) 116 112.8 108.6   Total Body Water (lbs) 85 82 78.2   24-hr recall: B (AM): fairlife protein shake (30g) Snk (AM): none L (PM): fairlife protein shake (30g) + PB fit Snk (PM): cheese stick (6g) or sugar-free candy D (PM): chicken or shrimp, green beans or pork and beans, hot dog, with butter Snk (PM): none  Fluid intake: 54-64 ounces of crystal light water and then 22 ounces of protein shakes  Estimated total protein intake: 60+  Medications:  see list Supplementation: bariatric advantage chewable with gross taste and 3 bariatric advantage calcium    Using straws: no Drinking while eating: no Having you been chewing well: yes Chewing/swallowing difficulties: no Changes in vision: no Changes to mood/headaches: sometimes due to allergies and has not been taking medication Hair loss/Changes to skin/Changes to nails: getting thicker, no, no Any difficulty focusing or concentrating: no Sweating: no Dizziness/Lightheaded: no Palpitations: no  Carbonated beverages: no N/V/D/C/GAS: no, no, no, no, yes Abdominal Pain: only when ulcer acts up Dumping syndrome: no  Recent  physical activity:  Walking, biking, strength training 120 min, 7 days/week; Stair stepper and treadmill at home 6 days for 45 minutes   Progress Towards Goal(s):  In progress.  Handouts given during visit include:  Starchy vegetables + protein    Nutritional Diagnosis:  Sanders-3.3 Overweight/obesity related to past poor dietary habits and physical inactivity as evidenced by patient w/ recent RYGB surgery following dietary guidelines for continued weight loss.  Intervention:  Nutrition counseling. Dietitian educated the pt on advancing her diet to include starchy vegetables. Goals:  Follow Phase V: High Protein + All Vegetables  Eat 3-6 small meals/snacks, every 3-5 hrs  Increase lean protein foods to meet 60g goal  Increase fluid intake to 64oz +  Avoid drinking 15 minutes before, during and 30 minutes after eating  Aim for >30 min of physical activity daily  Teaching Method Utilized:  Visual Auditory Hands on  Barriers to learning/adherence to lifestyle change: none identified   Demonstrated degree of understanding via:  Teach Back   Monitoring/Evaluation:  Dietary intake, exercise, and body weight. Follow up in 2 months for 6 month post-op visit.

## 2017-11-12 DIAGNOSIS — K289 Gastrojejunal ulcer, unspecified as acute or chronic, without hemorrhage or perforation: Secondary | ICD-10-CM | POA: Diagnosis not present

## 2017-11-12 DIAGNOSIS — E669 Obesity, unspecified: Secondary | ICD-10-CM | POA: Diagnosis not present

## 2017-11-12 DIAGNOSIS — K2270 Barrett's esophagus without dysplasia: Secondary | ICD-10-CM | POA: Diagnosis not present

## 2017-11-12 DIAGNOSIS — E1169 Type 2 diabetes mellitus with other specified complication: Secondary | ICD-10-CM | POA: Diagnosis not present

## 2017-11-12 DIAGNOSIS — Z9884 Bariatric surgery status: Secondary | ICD-10-CM | POA: Diagnosis not present

## 2017-11-21 ENCOUNTER — Telehealth: Payer: Self-pay | Admitting: Gastroenterology

## 2017-11-21 NOTE — Telephone Encounter (Signed)
Patient called and is expecting a call back TODAY! Her surgeon and Dr. Servando SnareWohl spoke and she needs to set up an appt for a EGD ASAP.  Please call her TODAY

## 2017-11-22 NOTE — Telephone Encounter (Signed)
Dr. Julien Nordmann office called to check on apt they were informed [name] is out the office and we will call pt Monday to get scheduled for EGD  Pt is also aware

## 2017-11-25 ENCOUNTER — Other Ambulatory Visit: Payer: Self-pay

## 2017-11-25 DIAGNOSIS — K253 Acute gastric ulcer without hemorrhage or perforation: Secondary | ICD-10-CM

## 2017-11-25 NOTE — Telephone Encounter (Signed)
Pt has been scheduled for an EGD with Wohl at [facility] on 11/29/17.

## 2017-11-28 NOTE — Discharge Instructions (Signed)
General Anesthesia, Adult, Care After °These instructions provide you with information about caring for yourself after your procedure. Your health care provider may also give you more specific instructions. Your treatment has been planned according to current medical practices, but problems sometimes occur. Call your health care provider if you have any problems or questions after your procedure. °What can I expect after the procedure? °After the procedure, it is common to have: °· Vomiting. °· A sore throat. °· Mental slowness. ° °It is common to feel: °· Nauseous. °· Cold or shivery. °· Sleepy. °· Tired. °· Sore or achy, even in parts of your body where you did not have surgery. ° °Follow these instructions at home: °For at least 24 hours after the procedure: °· Do not: °? Participate in activities where you could fall or become injured. °? Drive. °? Use heavy machinery. °? Drink alcohol. °? Take sleeping pills or medicines that cause drowsiness. °? Make important decisions or sign legal documents. °? Take care of children on your own. °· Rest. °Eating and drinking °· If you vomit, drink water, juice, or soup when you can drink without vomiting. °· Drink enough fluid to keep your urine clear or pale yellow. °· Make sure you have little or no nausea before eating solid foods. °· Follow the diet recommended by your health care provider. °General instructions °· Have a responsible adult stay with you until you are awake and alert. °· Return to your normal activities as told by your health care provider. Ask your health care provider what activities are safe for you. °· Take over-the-counter and prescription medicines only as told by your health care provider. °· If you smoke, do not smoke without supervision. °· Keep all follow-up visits as told by your health care provider. This is important. °Contact a health care provider if: °· You continue to have nausea or vomiting at home, and medicines are not helpful. °· You  cannot drink fluids or start eating again. °· You cannot urinate after 8-12 hours. °· You develop a skin rash. °· You have fever. °· You have increasing redness at the site of your procedure. °Get help right away if: °· You have difficulty breathing. °· You have chest pain. °· You have unexpected bleeding. °· You feel that you are having a life-threatening or urgent problem. °This information is not intended to replace advice given to you by your health care provider. Make sure you discuss any questions you have with your health care provider. °Document Released: 10/22/2000 Document Revised: 12/19/2015 Document Reviewed: 06/30/2015 °Elsevier Interactive Patient Education © 2018 Elsevier Inc. ° °

## 2017-11-29 ENCOUNTER — Encounter
Admission: RE | Disposition: A | Discharge status: Home / Self Care | Payer: Self-pay | Source: Ambulatory Visit | Attending: Gastroenterology

## 2017-11-29 ENCOUNTER — Ambulatory Visit: Payer: PPO | Admitting: Anesthesiology

## 2017-11-29 ENCOUNTER — Ambulatory Visit
Admission: RE | Admit: 2017-11-29 | Discharge: 2017-11-29 | Disposition: A | Discharge status: Home / Self Care | Payer: PPO | Source: Ambulatory Visit | Attending: Gastroenterology | Admitting: Gastroenterology

## 2017-11-29 DIAGNOSIS — Z885 Allergy status to narcotic agent status: Secondary | ICD-10-CM | POA: Insufficient documentation

## 2017-11-29 DIAGNOSIS — Z888 Allergy status to other drugs, medicaments and biological substances status: Secondary | ICD-10-CM | POA: Diagnosis not present

## 2017-11-29 DIAGNOSIS — R1013 Epigastric pain: Secondary | ICD-10-CM | POA: Insufficient documentation

## 2017-11-29 DIAGNOSIS — M479 Spondylosis, unspecified: Secondary | ICD-10-CM | POA: Insufficient documentation

## 2017-11-29 DIAGNOSIS — Z880 Allergy status to penicillin: Secondary | ICD-10-CM | POA: Insufficient documentation

## 2017-11-29 DIAGNOSIS — Z91040 Latex allergy status: Secondary | ICD-10-CM | POA: Insufficient documentation

## 2017-11-29 DIAGNOSIS — J45909 Unspecified asthma, uncomplicated: Secondary | ICD-10-CM | POA: Diagnosis not present

## 2017-11-29 DIAGNOSIS — F410 Panic disorder [episodic paroxysmal anxiety] without agoraphobia: Secondary | ICD-10-CM | POA: Insufficient documentation

## 2017-11-29 DIAGNOSIS — Z9884 Bariatric surgery status: Secondary | ICD-10-CM | POA: Diagnosis not present

## 2017-11-29 DIAGNOSIS — K219 Gastro-esophageal reflux disease without esophagitis: Secondary | ICD-10-CM | POA: Insufficient documentation

## 2017-11-29 DIAGNOSIS — Z87891 Personal history of nicotine dependence: Secondary | ICD-10-CM | POA: Diagnosis not present

## 2017-11-29 DIAGNOSIS — R7303 Prediabetes: Secondary | ICD-10-CM | POA: Insufficient documentation

## 2017-11-29 DIAGNOSIS — Z4802 Encounter for removal of sutures: Secondary | ICD-10-CM | POA: Diagnosis not present

## 2017-11-29 DIAGNOSIS — Z79899 Other long term (current) drug therapy: Secondary | ICD-10-CM | POA: Insufficient documentation

## 2017-11-29 DIAGNOSIS — M170 Bilateral primary osteoarthritis of knee: Secondary | ICD-10-CM | POA: Diagnosis not present

## 2017-11-29 HISTORY — PX: ESOPHAGOGASTRODUODENOSCOPY (EGD) WITH PROPOFOL: SHX5813

## 2017-11-29 SURGERY — ESOPHAGOGASTRODUODENOSCOPY (EGD) WITH PROPOFOL
Anesthesia: General | Wound class: Clean Contaminated

## 2017-11-29 MED ORDER — ONDANSETRON HCL 4 MG/2ML IJ SOLN
INTRAMUSCULAR | Status: DC | PRN
  Administered 2017-11-29: 4 mg via INTRAVENOUS

## 2017-11-29 MED ORDER — FENTANYL CITRATE (PF) 100 MCG/2ML IJ SOLN: 25.0000 ug | INTRAMUSCULAR | Status: DC | PRN

## 2017-11-29 MED ORDER — PROMETHAZINE HCL 25 MG/ML IJ SOLN: 6.2500 mg | INTRAMUSCULAR | Status: DC | PRN

## 2017-11-29 MED ORDER — GLYCOPYRROLATE 0.2 MG/ML IJ SOLN
INTRAMUSCULAR | Status: DC | PRN
  Administered 2017-11-29: 0.2 mg via INTRAVENOUS

## 2017-11-29 MED ORDER — LIDOCAINE HCL (CARDIAC) PF 100 MG/5ML IV SOSY
PREFILLED_SYRINGE | INTRAVENOUS | Status: DC | PRN
  Administered 2017-11-29: 40 mg via INTRAVENOUS

## 2017-11-29 MED ORDER — LACTATED RINGERS IV SOLN
INTRAVENOUS | Status: DC
  Administered 2017-11-29: 07:00:00 via INTRAVENOUS

## 2017-11-29 MED ORDER — PROPOFOL 10 MG/ML IV BOLUS
INTRAVENOUS | Status: DC | PRN
  Administered 2017-11-29 (×2): 20 mg via INTRAVENOUS
  Administered 2017-11-29: 100 mg via INTRAVENOUS
  Administered 2017-11-29: 50 mg via INTRAVENOUS
  Administered 2017-11-29: 10 mg via INTRAVENOUS

## 2017-11-29 MED ORDER — MEPERIDINE HCL 25 MG/ML IJ SOLN: 6.2500 mg | INTRAMUSCULAR | Status: DC | PRN

## 2017-11-29 MED ORDER — STERILE WATER FOR IRRIGATION IR SOLN
Status: DC | PRN
  Administered 2017-11-29: 08:00:00

## 2017-11-29 SURGICAL SUPPLY — 33 items
BALLN DILATOR 10-12 8 (BALLOONS)
BALLN DILATOR 12-15 8 (BALLOONS)
BALLN DILATOR 15-18 8 (BALLOONS)
BALLN DILATOR CRE 0-12 8 (BALLOONS)
BALLN DILATOR ESOPH 8 10 CRE (MISCELLANEOUS) IMPLANT
BALLOON DILATOR 12-15 8 (BALLOONS) IMPLANT
BALLOON DILATOR 15-18 8 (BALLOONS) IMPLANT
BALLOON DILATOR CRE 0-12 8 (BALLOONS) IMPLANT
BLOCK BITE 60FR ADLT L/F GRN (MISCELLANEOUS) ×3 IMPLANT
CANISTER SUCT 1200ML W/VALVE (MISCELLANEOUS) ×3 IMPLANT
CLIP HMST 235XBRD CATH ROT (MISCELLANEOUS) IMPLANT
CLIP RESOLUTION 360 11X235 (MISCELLANEOUS)
ELECT REM PT RETURN 9FT ADLT (ELECTROSURGICAL)
ELECTRODE REM PT RTRN 9FT ADLT (ELECTROSURGICAL) IMPLANT
FCP ESCP3.2XJMB 240X2.8X (MISCELLANEOUS)
FORCEPS BIOP RAD 4 LRG CAP 4 (CUTTING FORCEPS) ×2 IMPLANT
FORCEPS BIOP RJ4 240 W/NDL (MISCELLANEOUS)
FORCEPS ESCP3.2XJMB 240X2.8X (MISCELLANEOUS) IMPLANT
GOWN CVR UNV OPN BCK APRN NK (MISCELLANEOUS) ×2 IMPLANT
GOWN ISOL THUMB LOOP REG UNIV (MISCELLANEOUS) ×6
INJECTOR VARIJECT VIN23 (MISCELLANEOUS) IMPLANT
KIT DEFENDO VALVE AND CONN (KITS) IMPLANT
KIT ENDO PROCEDURE OLY (KITS) ×3 IMPLANT
MARKER SPOT ENDO TATTOO 5ML (MISCELLANEOUS) IMPLANT
RETRIEVER NET PLAT FOOD (MISCELLANEOUS) IMPLANT
SNARE SHORT THROW 13M SML OVAL (MISCELLANEOUS) IMPLANT
SNARE SHORT THROW 30M LRG OVAL (MISCELLANEOUS) IMPLANT
SPOT EX ENDOSCOPIC TATTOO (MISCELLANEOUS)
SYR INFLATION 60ML (SYRINGE) IMPLANT
TRAP ETRAP POLY (MISCELLANEOUS) IMPLANT
VARIJECT INJECTOR VIN23 (MISCELLANEOUS)
WATER STERILE IRR 250ML POUR (IV SOLUTION) ×3 IMPLANT
WIRE CRE 18-20MM 8CM F G (MISCELLANEOUS) IMPLANT

## 2017-11-29 NOTE — Transfer of Care (Signed)
Immediate Anesthesia Transfer of Care Note  Patient: [name]  Procedure(s) Performed: ESOPHAGOGASTRODUODENOSCOPY (EGD) WITH PROPOFOL (N/A )  Patient Location: PACU  Anesthesia Type: General  Level of Consciousness: awake, alert  and patient cooperative  Airway and Oxygen Therapy: Patient Spontanous Breathing and Patient connected to supplemental oxygen  Post-op Assessment: Post-op Vital signs reviewed, Patient's Cardiovascular Status Stable, Respiratory Function Stable, Patent Airway and No signs of Nausea or vomiting  Post-op Vital Signs: Reviewed and stable  Complications: No apparent anesthesia complications

## 2017-11-29 NOTE — H&P (Signed)
Midge Minium, MD [facility] [address], [address] [place], [place] [address] Phone:[phone] Fax : [phone]  Primary Care Physician:  Titus Mould, NP Primary Gastroenterologist:  Dr. Servando Snare  Pre-Procedure History & Physical: HPI:  [name] is a 42 y.o. female is here for an endoscopy.   Past Medical History:  Diagnosis Date  . Anemia    Low iron  . Anxiety    Panic Disorder  . Arthritis    back and knees, left elbow  . Asthma    long time since asthma attack  . Bronchitis    Chronic  . Cough    hx of bronchitis  . GERD (gastroesophageal reflux disease)   . Migraines    allergies  . Motion sickness   . Neuromuscular disorder (HCC)    numbness Right leg  . Pre-diabetes     Past Surgical History:  Procedure Laterality Date  . ABDOMINAL HYSTERECTOMY     2004  . BACK SURGERY  07/10/2006   L4-L5   . CARPAL TUNNEL RELEASE     right along with trigger finger   . ESOPHAGOGASTRODUODENOSCOPY N/A 10/03/2017   Procedure: ESOPHAGOGASTRODUODENOSCOPY (EGD) WITH POSSIBLE BIOPSY;  Surgeon: Gaynelle Adu, MD;  Location: [facility];  Service: General;  Laterality: N/A;  . ESOPHAGOGASTRODUODENOSCOPY (EGD) WITH PROPOFOL N/A 05/17/2016   Procedure: ESOPHAGOGASTRODUODENOSCOPY (EGD) WITH PROPOFOL;  Surgeon: Midge Minium, MD;  Location: [facility];  Service: Endoscopy;  Laterality: N/A;  . ETHMOIDECTOMY Bilateral 04/07/2015   Procedure:  TOTAL ETHMOIDECTOMIES;  Surgeon: Vernie Murders, MD;  Location: [facility];  Service: ENT;  Laterality: Bilateral;  . FRONTAL SINUS EXPLORATION Bilateral 04/07/2015   Procedure: FRONTAL SINUSOTOMIES;  Surgeon: Vernie Murders, MD;  Location: [facility];  Service: ENT;  Laterality: Bilateral;  . IMAGE GUIDED SINUS SURGERY N/A 04/07/2015   Procedure: IMAGE GUIDED SINUS SURGERY;  Surgeon: Vernie Murders, MD;  Location: [facility];  Service: ENT;  Laterality: N/A;  GAVE DISK TO BRENDA  . LAPAROSCOPIC ROUX-EN-Y  GASTRIC BYPASS WITH HIATAL HERNIA REPAIR N/A 07/09/2017   Procedure: LAPAROSCOPIC ROUX-EN-Y GASTRIC BYPASS WITH HIATAL HERNIA REPAIR UPPER ENDO;  Surgeon: Gaynelle Adu, MD;  Location: [facility];  Service: General;  Laterality: N/A;  . MAXILLARY ANTROSTOMY Bilateral 04/07/2015   Procedure: MAXILLARY ANTROSTOMIES;  Surgeon: Vernie Murders, MD;  Location: [facility];  Service: ENT;  Laterality: Bilateral;  . SEPTOPLASTY N/A 04/07/2015   Procedure: SEPTOPLASTY;  Surgeon: Vernie Murders, MD;  Location: [facility];  Service: ENT;  Laterality: N/A;  . TONSILLECTOMY    . ULNAR NERVE TRANSPOSITION    . UPPER GI ENDOSCOPY  07/09/2017   Procedure: UPPER GI ENDOSCOPY;  Surgeon: Gaynelle Adu, MD;  Location: [facility];  Service: General;;    Prior to Admission medications   Medication Sig Start Date End Date Taking? Authorizing Provider  BIOTIN PO Take 2 each by mouth daily.    Yes [provider]  Ca Phosphate-Cholecalciferol (CALCIUM/VITAMIN D3 GUMMIES PO) Take 1 each by mouth 3 (three) times daily.   Yes [provider]  Melatonin 3 MG CAPS Take 6 mg by mouth daily.   Yes [provider]  Multiple Vitamins-Minerals (MULTIVITAMIN PO) Take 1 tablet by mouth 2 (two) times daily.   Yes [provider]  ondansetron (ZOFRAN-ODT) 4 MG disintegrating tablet Take 4 mg by mouth 3 (three) times daily as needed for nausea or vomiting.  09/10/17  Yes [provider]  pantoprazole (PROTONIX) 40 MG tablet Take  1 tablet (40 mg total) by mouth 2 (two) times daily. 10/03/17  Yes Gaynelle Adu, MD  sucralfate (CARAFATE) 1 GM/10ML suspension Take 10 mLs (1 g total) by mouth 4 (four) times daily. 10/03/17 10/03/18 Yes Gaynelle Adu, MD  albuterol (PROVENTIL HFA;VENTOLIN HFA) 108 (90 BASE) MCG/ACT inhaler Inhale 2 puffs into the lungs every 6 (six) hours as needed for wheezing or shortness of breath.  08/19/14   [provider]    Allergies as of 11/25/2017 - Review Complete  11/25/2017  Allergen Reaction Noted  . Penicillins Anaphylaxis and Other (See Comments) 08/02/2009  . Oxycodone Itching, Nausea Only, and Other (See Comments) 12/31/2014  . Prednisone Other (See Comments) 12/31/2014  . Quinolones Other (See Comments) 12/31/2014  . Latex Rash and Other (See Comments) 12/31/2014    Family History  Problem Relation Age of Onset  . Ovarian cancer Sister 57  . Breast cancer Neg Hx     Social History   Socioeconomic History  . Marital status: Married    Spouse name: Not on file  . Number of children: Not on file  . Years of education: Not on file  . Highest education level: Not on file  Occupational History  . Not on file  Social Needs  . Financial resource strain: Not on file  . Food insecurity:    Worry: Not on file    Inability: Not on file  . Transportation needs:    Medical: Not on file    Non-medical: Not on file  Tobacco Use  . Smoking status: Former Smoker    Packs/day: 0.25    Years: 20.00    Pack years: 5.00    Types: Cigarettes    Last attempt to quit: 03/30/2017    Years since quitting: 0.6  . Smokeless tobacco: Never Used  Substance and Sexual Activity  . Alcohol use: No  . Drug use: No  . Sexual activity: Not on file  Lifestyle  . Physical activity:    Days per week: Not on file    Minutes per session: Not on file  . Stress: Not on file  Relationships  . Social connections:    Talks on phone: Not on file    Gets together: Not on file    Attends religious service: Not on file    Active member of club or organization: Not on file    Attends meetings of clubs or organizations: Not on file    Relationship status: Not on file  . Intimate partner violence:    Fear of current or ex partner: Not on file    Emotionally abused: Not on file    Physically abused: Not on file    Forced sexual activity: Not on file  Other Topics Concern  . Not on file  Social History Narrative  . Not on file    Review of Systems: See  HPI, otherwise negative ROS  Physical Exam: BP 129/83   Pulse 84   Temp 98.1 F (36.7 C) (Temporal)   Ht  (1.702 m)   Wt 196 lb (88.9 kg)   SpO2 99%   BMI 30.70 kg/m  General:   Alert,  pleasant and cooperative in NAD Head:  Normocephalic and atraumatic. Neck:  Supple; no masses or thyromegaly. Lungs:  Clear throughout to auscultation.    Heart:  Regular rate and rhythm. Abdomen:  Soft, nontender and nondistended. Normal bowel sounds, without guarding, and without rebound.   Neurologic:  Alert and  oriented x4;  grossly normal neurologically.  Impression/Plan: Newt Minion is here for an endoscopy to be performed for epigastric pain  Risks, benefits, limitations, and alternatives regarding  endoscopy have been reviewed with the patient.  Questions have been answered.  All parties agreeable.   Midge Minium, MD  11/29/2017, 7:26 AM

## 2017-11-29 NOTE — Anesthesia Preprocedure Evaluation (Signed)
Anesthesia Evaluation  Patient identified by MRN, date of birth, ID band Patient awake    Reviewed: Allergy & Precautions, H&P , NPO status , Patient's Chart, lab work & pertinent test results, reviewed documented beta blocker date and time   History of Anesthesia Complications Negative for: history of anesthetic complications  Airway Mallampati: III  TM Distance: >3 FB Neck ROM: full    Dental no notable dental hx.    Pulmonary neg pulmonary ROS, asthma , former smoker,    Pulmonary exam normal breath sounds clear to auscultation       Cardiovascular Exercise Tolerance: Good negative cardio ROS   Rhythm:regular Rate:Normal     Neuro/Psych  Headaches, PSYCHIATRIC DISORDERS Anxiety negative neurological ROS  negative psych ROS   GI/Hepatic Neg liver ROS, hiatal hernia, GERD  ,S/p gastric bypass   Endo/Other  negative endocrine ROSdiabetes  Renal/GU negative Renal ROS  negative genitourinary   Musculoskeletal   Abdominal   Peds  Hematology negative hematology ROS (+) anemia ,   Anesthesia Other Findings   Reproductive/Obstetrics negative OB ROS                             Anesthesia Physical  Anesthesia Plan  ASA: III  Anesthesia Plan: General   Post-op Pain Management:    Induction: Intravenous  PONV Risk Score and Plan: 2 and Ondansetron, Treatment may vary due to age or medical condition and Propofol infusion  Airway Management Planned: Natural Airway and Nasal Cannula  Additional Equipment:   Intra-op Plan:   Post-operative Plan:   Informed Consent: I have reviewed the patients History and Physical, chart, labs and discussed the procedure including the risks, benefits and alternatives for the proposed anesthesia with the patient or authorized representative who has indicated his/her understanding and acceptance.   Dental Advisory Given  Plan Discussed with: CRNA and  Surgeon  Anesthesia Plan Comments: (  )        Anesthesia Quick Evaluation

## 2017-11-29 NOTE — Anesthesia Postprocedure Evaluation (Signed)
Anesthesia Post Note  Patient: [name]  Procedure(s) Performed: ESOPHAGOGASTRODUODENOSCOPY (EGD) WITH PROPOFOL (N/A )  Patient location during evaluation: PACU Anesthesia Type: General Level of consciousness: awake and alert Pain management: pain level controlled Vital Signs Assessment: post-procedure vital signs reviewed and stable Respiratory status: spontaneous breathing, nonlabored ventilation, respiratory function stable and patient connected to nasal cannula oxygen Cardiovascular status: blood pressure returned to baseline and stable Postop Assessment: no apparent nausea or vomiting Anesthetic complications: no    SCOURAS, NICOLE ELAINE

## 2017-11-29 NOTE — Op Note (Signed)
[facility] Patient Name: [name] Procedure Date: 11/29/2017 7:36 AM MRN: [id number] Account #: [id number] Date of Birth: Oct 30, 1975 Admit Type: Outpatient Age: 42 Room: [facility] OR ROOM 01 Gender: Female Note Status: Finalized Procedure:            Upper GI endoscopy Indications:          Epigastric abdominal pain Providers:            Midge Minium MD, MD Referring MD:         Courtney Paris. White, MD (Referring MD) Medicines:            Propofol per Anesthesia Complications:        No immediate complications. Procedure:            Pre-Anesthesia Assessment:                       - Prior to the procedure, a History and Physical was                        performed, and patient medications and allergies were                        reviewed. The patient's tolerance of previous                        anesthesia was also reviewed. The risks and benefits of                        the procedure and the sedation options and risks were                        discussed with the patient. All questions were                        answered, and informed consent was obtained. Prior                        Anticoagulants: The patient has taken no previous                        anticoagulant or antiplatelet agents. ASA Grade                        Assessment: II - A patient with mild systemic disease.                        After reviewing the risks and benefits, the patient was                        deemed in satisfactory condition to undergo the                        procedure.                       After obtaining informed consent, the endoscope was                        passed under direct vision. Throughout the procedure,  the patient's blood pressure, pulse, and oxygen                        saturations were monitored continuously. The Olympus                        GIF H180J Endoscope (S#: [id number]) was introduced        through the mouth, and advanced to the jejunum. The                        upper GI endoscopy was accomplished without difficulty.                        The patient tolerated the procedure well. Findings:      The Z-line was irregular and was found at the gastroesophageal junction.      Evidence of a gastric bypass was found. A gastric pouch with a normal       size was found. The staple line appeared intact. This was traversed. The       pouch-to-jejunum limb was characterized by healthy appearing mucosa.      The examined jejunum was normal.      Nine staple was found at the anastomosis. Removal was accomplished with       a regular forceps. Impression:           - Z-line irregular, at the gastroesophageal junction.                       - Gastric bypass with a normal-sized pouch and intact                        staple line.                       - Normal examined jejunum.                       - Nine staples was found in the stomach. Removal was                        successful.                       - Thre previously seen ulcer has healed. Recommendation:       - Discharge patient to home.                       - Resume previous diet.                       - Continue present medications. Procedure Code(s):    --- Professional ---                       [phone], Esophagogastroduodenoscopy, flexible, transoral;                        with removal of foreign body(s) Diagnosis Code(s):    --- Professional ---                       R10.13, Epigastric pain  Z98.84, Bariatric surgery status CPT copyright 2017 American Medical Association. All rights reserved. The codes documented in this report are preliminary and upon coder review may  be revised to meet current compliance requirements. Midge Minium MD, MD 11/29/2017 7:54:20 AM This report has been signed electronically. Number of Addenda: 0 Note Initiated On: 11/29/2017 7:36 AM      [facility]

## 2017-12-02 ENCOUNTER — Encounter: Payer: Self-pay | Admitting: Gastroenterology

## 2018-01-13 ENCOUNTER — Ambulatory Visit: Payer: Self-pay | Admitting: Skilled Nursing Facility1

## 2018-01-13 DIAGNOSIS — R197 Diarrhea, unspecified: Secondary | ICD-10-CM | POA: Diagnosis not present

## 2018-01-13 DIAGNOSIS — J029 Acute pharyngitis, unspecified: Secondary | ICD-10-CM | POA: Diagnosis not present

## 2018-01-13 DIAGNOSIS — R509 Fever, unspecified: Secondary | ICD-10-CM | POA: Diagnosis not present

## 2018-01-20 DIAGNOSIS — R05 Cough: Secondary | ICD-10-CM | POA: Diagnosis not present

## 2018-01-22 ENCOUNTER — Ambulatory Visit: Payer: Self-pay | Admitting: Skilled Nursing Facility1

## 2018-03-03 ENCOUNTER — Encounter: Payer: Self-pay | Admitting: Skilled Nursing Facility1

## 2018-03-03 ENCOUNTER — Encounter: Payer: PPO | Attending: General Surgery | Admitting: Skilled Nursing Facility1

## 2018-03-03 DIAGNOSIS — E119 Type 2 diabetes mellitus without complications: Secondary | ICD-10-CM

## 2018-03-03 DIAGNOSIS — Z713 Dietary counseling and surveillance: Secondary | ICD-10-CM | POA: Diagnosis not present

## 2018-03-03 DIAGNOSIS — Z6827 Body mass index (BMI) 27.0-27.9, adult: Secondary | ICD-10-CM | POA: Insufficient documentation

## 2018-03-03 NOTE — Progress Notes (Signed)
Post-Operative RYGB Surgery  Primary concerns today: Post-operative Bariatric Surgery Nutrition Management. Pt was talkative   Non-scale victories: wearing sizes 12/14 from sizes 22, no longer wearing plus sizes, went dancing with friends and less winded, biking with husband on the weekends  Pt states 9 staples were were removed from the bottom of her stomach which is why she was having so much pain with eating. Pt states she is very upset this happened. Pt states she likes the beyond meat burger. Pt states had some pasta. Pt states she cannot tolerate sugar. Pt states she wants to lose 100 pounds and is frustrated with the lack of weight loss. Pt does admits to having alcohol.  Pt states she logs her food in baritastic app every day. Pt states she is still not smoking.   Surgery date: 07/09/2017 Surgery type: RYGB [facility] Weight today: 177.4 Weight Change: 24 Weight loss goal: did not want to shop in plus sizes anymore, didn't want family to ridicule her about her weight and size, increase energy  TANITA  BODY COMP RESULTS  07/25/2017 09/09/2017 11/11/2017 03/03/2018   BMI (kg/m^2) 37.9 34.9 31.5 27.8   Fat Mass (lbs) 126 110 92.8 72.2   Fat Free Mass (lbs) 116 112.8 108.6 105.2   Total Body Water (lbs) 85 82 78.2 75   24-hr recall: having vegetables 2 times a day 7 days usually a week; 1 time a day dark chocolate  B (AM): fairlife protein shake (30g) or egg bites (tries not to do the protein shake every day) and coffee Snk (AM): none L (PM): salad with fruit  Snk (PM): cheese stick (6g) or sugar-free candy D (PM): chicken or shrimp, green beans or pork and beans, hot dog, with butter Snk (PM): none  Fluid intake: 64-80 ounces of crystal light water and then 22 ounces of protein shakes  Estimated total protein intake: 60+  Medications: see list Supplementation: fusion capsule and 3 bariatric advantage calcium    Using straws: no Drinking while eating:  no Having you been chewing well: yes Chewing/swallowing difficulties: no Changes in vision: no Changes to mood/headaches: sometimes due to allergies and has not been taking medication Hair loss/Changes to skin/Changes to nails: getting thicker, no, no Any difficulty focusing or concentrating: no Sweating: no Dizziness/Lightheaded: no Palpitations: no  Carbonated beverages: no N/V/D/C/GAS: no Abdominal Pain: no Dumping syndrome: no  Recent physical activity:  Walking, biking, strength training 120 min, 7 days/week; Stair stepper and treadmill at home 6 days for 45 minutes-3-4 days a week at the gym   Progress Towards Goal(s):  In progress.  Handouts given during visit include:  Starchy vegetables + protein    Nutritional Diagnosis:  [place]-3.3 Overweight/obesity related to past poor dietary habits and physical inactivity as evidenced by patient w/ recent RYGB surgery following dietary guidelines for continued weight loss.  Intervention:  Nutrition counseling. Dietitian educated the pt on advancing her diet to include starchy vegetables. Goals:  Eat all of the tings: eat until satisfaction  Breakfast: energy + protein  Lunch and dinner: energy + protein+ non starchy vegetables  Snack: fruit or vegetable or protein   Teaching Method Utilized:  Visual Auditory Hands on  Barriers to learning/adherence to lifestyle change: none identified   Demonstrated degree of understanding via:  Teach Back   Monitoring/Evaluation:  Dietary intake, exercise, and body weight.

## 2018-04-22 DIAGNOSIS — M542 Cervicalgia: Secondary | ICD-10-CM | POA: Diagnosis not present

## 2018-04-28 ENCOUNTER — Other Ambulatory Visit: Payer: Self-pay | Admitting: Neurosurgery

## 2018-04-28 DIAGNOSIS — M542 Cervicalgia: Secondary | ICD-10-CM

## 2018-05-04 ENCOUNTER — Ambulatory Visit
Admission: RE | Admit: 2018-05-04 | Discharge: 2018-05-04 | Disposition: A | Discharge status: Home / Self Care | Payer: PPO | Source: Ambulatory Visit | Attending: Neurosurgery | Admitting: Neurosurgery

## 2018-05-04 DIAGNOSIS — M542 Cervicalgia: Secondary | ICD-10-CM

## 2018-05-06 DIAGNOSIS — Z6826 Body mass index (BMI) 26.0-26.9, adult: Secondary | ICD-10-CM | POA: Diagnosis not present

## 2018-05-06 DIAGNOSIS — G5621 Lesion of ulnar nerve, right upper limb: Secondary | ICD-10-CM | POA: Diagnosis not present

## 2018-05-06 DIAGNOSIS — M542 Cervicalgia: Secondary | ICD-10-CM | POA: Diagnosis not present

## 2018-05-21 DIAGNOSIS — M79641 Pain in right hand: Secondary | ICD-10-CM | POA: Diagnosis not present

## 2018-05-21 DIAGNOSIS — G5603 Carpal tunnel syndrome, bilateral upper limbs: Secondary | ICD-10-CM | POA: Diagnosis not present

## 2018-05-21 DIAGNOSIS — G5621 Lesion of ulnar nerve, right upper limb: Secondary | ICD-10-CM | POA: Diagnosis not present

## 2018-06-09 DIAGNOSIS — M542 Cervicalgia: Secondary | ICD-10-CM | POA: Diagnosis not present

## 2018-06-09 DIAGNOSIS — M25522 Pain in left elbow: Secondary | ICD-10-CM | POA: Diagnosis not present

## 2018-06-09 DIAGNOSIS — R200 Anesthesia of skin: Secondary | ICD-10-CM | POA: Diagnosis not present

## 2018-06-19 DIAGNOSIS — M25552 Pain in left hip: Secondary | ICD-10-CM | POA: Diagnosis not present

## 2018-06-19 DIAGNOSIS — S39012A Strain of muscle, fascia and tendon of lower back, initial encounter: Secondary | ICD-10-CM | POA: Diagnosis not present

## 2018-06-19 DIAGNOSIS — M5442 Lumbago with sciatica, left side: Secondary | ICD-10-CM | POA: Diagnosis not present

## 2018-06-20 DIAGNOSIS — M25571 Pain in right ankle and joints of right foot: Secondary | ICD-10-CM | POA: Diagnosis not present

## 2018-07-03 ENCOUNTER — Encounter: Payer: Self-pay | Admitting: Skilled Nursing Facility1

## 2018-07-03 ENCOUNTER — Encounter: Payer: PPO | Attending: General Surgery | Admitting: Skilled Nursing Facility1

## 2018-07-03 DIAGNOSIS — Z6827 Body mass index (BMI) 27.0-27.9, adult: Secondary | ICD-10-CM | POA: Diagnosis not present

## 2018-07-03 DIAGNOSIS — Z713 Dietary counseling and surveillance: Secondary | ICD-10-CM | POA: Insufficient documentation

## 2018-07-03 DIAGNOSIS — E119 Type 2 diabetes mellitus without complications: Secondary | ICD-10-CM

## 2018-07-03 NOTE — Progress Notes (Signed)
Post-Operative RYGB Surgery  Primary concerns today: Post-operative Bariatric Surgery Nutrition Management. Pt is talkative   Non-scale victories: wearing sizes 12/14 from sizes 22, no longer wearing plus sizes, went dancing with friends and less winded, biking with husband on the weekends  Pt states she struggles with bought's of extreme fatigue. Pt states he got 6 staples pulled out of her stomach not knowing how that happened. Pt states she cannot eat hamburger. Pt states she likes the beyond meat burgers. Pt states she eats corn but states it comes out very fast (bowel movement). Pt states she gets a headache once a week due to allergies.  Pt states she is frustrated she never got a call from her surgeon for follow up care from the removal of the staples from her stomach and states she wants a follow up for her year out but has not gotten any calls returned to her. Pt state she has noticed the Facebook group has caused her a lot of stress and fear to eat any carbohydrates. Pt states she is very afraid if she eats any carbohydrates at all she is going to gain all of her weight back.  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   Surgery date: 07/09/2017 Surgery type: RYGB Start weight at [facility]: 249.2 Weight today: 162.6 Weight Change: 14.8 Weight loss goal: did not want to shop in plus sizes anymore, didn't want family to ridicule her about her weight and size, increase energy  TANITA  BODY COMP RESULTS  11/11/2017 03/03/2018 07/03/2018   BMI (kg/m^2) 31.5 27.8 25.5   Fat Mass (lbs) 92.8 72.2 59.6   Fat Free Mass (lbs) 108.6 105.2 103   Total Body Water (lbs) 78.2 75 72.6   24-hr recall: having vegetables 2 times a day 7 days usually a week; 1 time a day dark chocolate  B (AM): ham and cheddar egg bites (13 CHO and 13 protein) Snk (AM): sugar free regular coffee with skim milk L (PM): grilled chicken tenders with green beans or protein shake  with 4 strawberries and PB fit Snk (PM): cheese stick (6g) or boiled egg or very rarley multigrain crackers D (PM): grilled shrimp or chicken or [place] with broccoli and cheese or green beans or somtimes corn Snk (PM): none  Fluid intake: 63-74 ounces water with crystal light  Estimated total protein intake: 60+  Medications: see list Supplementation: fusion capsule and 3 bariatric advantage calcium    Using straws: no Drinking while eating: no Having you been chewing well: yes Chewing/swallowing difficulties: no Changes in vision: no Changes to mood/headaches: sometimes due to allergies and has not been taking medication Hair loss/Changes to skin/Changes to nails: getting thicker, no, no Any difficulty focusing or concentrating: no Sweating: no Dizziness/Lightheaded: no Palpitations: no  Carbonated beverages: no N/V/D/C/GAS: no Abdominal Pain: no Dumping syndrome: no  Recent physical activity:  3 days a week at the gym and 16109 steps a day  Progress Towards Goal(s):  In progress.  Handouts given during visit include:  Starchy vegetables + protein    Nutritional Diagnosis:  [place]-3.3 Overweight/obesity related to past poor dietary habits and physical inactivity as evidenced by patient w/ recent RYGB surgery following dietary guidelines for continued weight loss.  Intervention:  Nutrition counseling. Dietitian educated the pt on advancing her diet to include starchy vegetables. Goals:  Eat all of the things: eat until satisfaction  Breakfast: energy + protein  Lunch and dinner:  energy + protein+ non starchy vegetables  Snack: fruit or vegetable or protein   15 grams of complex carbohydrate  3 times a day  Teaching Method Utilized:  Visual Auditory Hands on  Barriers to learning/adherence to lifestyle change: none identified   Demonstrated degree of understanding via:  Teach Back   Monitoring/Evaluation:  Dietary intake, exercise, and body weight. 3 months from  today

## 2018-07-03 NOTE — Patient Instructions (Signed)
-  Stick to 3 ounces of protein + energy sources + non starchy vegetables

## 2018-07-18 DIAGNOSIS — E559 Vitamin D deficiency, unspecified: Secondary | ICD-10-CM | POA: Diagnosis not present

## 2018-07-18 DIAGNOSIS — E669 Obesity, unspecified: Secondary | ICD-10-CM | POA: Diagnosis not present

## 2018-07-18 DIAGNOSIS — K2270 Barrett's esophagus without dysplasia: Secondary | ICD-10-CM | POA: Diagnosis not present

## 2018-07-18 DIAGNOSIS — E1169 Type 2 diabetes mellitus with other specified complication: Secondary | ICD-10-CM | POA: Diagnosis not present

## 2018-07-18 DIAGNOSIS — R252 Cramp and spasm: Secondary | ICD-10-CM | POA: Diagnosis not present

## 2018-07-18 DIAGNOSIS — K912 Postsurgical malabsorption, not elsewhere classified: Secondary | ICD-10-CM | POA: Diagnosis not present

## 2018-07-18 DIAGNOSIS — E785 Hyperlipidemia, unspecified: Secondary | ICD-10-CM | POA: Diagnosis not present

## 2018-07-18 DIAGNOSIS — E663 Overweight: Secondary | ICD-10-CM | POA: Diagnosis not present

## 2018-07-18 DIAGNOSIS — R5383 Other fatigue: Secondary | ICD-10-CM | POA: Diagnosis not present

## 2018-07-18 DIAGNOSIS — Z9884 Bariatric surgery status: Secondary | ICD-10-CM | POA: Diagnosis not present

## 2018-07-18 DIAGNOSIS — H539 Unspecified visual disturbance: Secondary | ICD-10-CM | POA: Diagnosis not present

## 2018-08-01 ENCOUNTER — Other Ambulatory Visit: Payer: Self-pay

## 2018-08-01 DIAGNOSIS — E559 Vitamin D deficiency, unspecified: Secondary | ICD-10-CM | POA: Diagnosis not present

## 2018-08-01 DIAGNOSIS — K912 Postsurgical malabsorption, not elsewhere classified: Secondary | ICD-10-CM | POA: Diagnosis not present

## 2018-08-01 DIAGNOSIS — R252 Cramp and spasm: Secondary | ICD-10-CM | POA: Diagnosis not present

## 2018-08-01 DIAGNOSIS — R5383 Other fatigue: Secondary | ICD-10-CM | POA: Diagnosis not present

## 2018-08-01 DIAGNOSIS — E1169 Type 2 diabetes mellitus with other specified complication: Secondary | ICD-10-CM | POA: Diagnosis not present

## 2018-08-06 DIAGNOSIS — R05 Cough: Secondary | ICD-10-CM | POA: Diagnosis not present

## 2018-08-06 DIAGNOSIS — J029 Acute pharyngitis, unspecified: Secondary | ICD-10-CM | POA: Diagnosis not present

## 2018-08-06 DIAGNOSIS — J069 Acute upper respiratory infection, unspecified: Secondary | ICD-10-CM | POA: Diagnosis not present

## 2018-08-07 LAB — CBC WITH DIFFERENTIAL/PLATELET
BASOS: 1 %
Basophils Absolute: 0.1 10*3/uL (ref 0.0–0.2)
EOS (ABSOLUTE): 0.2 10*3/uL (ref 0.0–0.4)
EOS: 2 %
HEMATOCRIT: 38.4 % (ref 34.0–46.6)
Hemoglobin: 13.1 g/dL (ref 11.1–15.9)
IMMATURE GRANULOCYTES: 0 %
Immature Grans (Abs): 0 10*3/uL (ref 0.0–0.1)
LYMPHS ABS: 2.2 10*3/uL (ref 0.7–3.1)
Lymphs: 28 %
MCH: 31.3 pg (ref 26.6–33.0)
MCHC: 34.1 g/dL (ref 31.5–35.7)
MCV: 92 fL (ref 79–97)
Monocytes Absolute: 0.4 10*3/uL (ref 0.1–0.9)
Monocytes: 5 %
Neutrophils Absolute: 5 10*3/uL (ref 1.4–7.0)
Neutrophils: 64 %
Platelets: 347 10*3/uL (ref 150–450)
RBC: 4.19 x10E6/uL (ref 3.77–5.28)
RDW: 12.5 % (ref 12.3–15.4)
WBC: 7.9 10*3/uL (ref 3.4–10.8)

## 2018-08-07 LAB — COMPREHENSIVE METABOLIC PANEL
A/G RATIO: 2.1 (ref 1.2–2.2)
ALT: 21 IU/L (ref 0–32)
AST: 16 IU/L (ref 0–40)
Albumin: 4.4 g/dL (ref 3.5–5.5)
Alkaline Phosphatase: 83 IU/L (ref 39–117)
BUN / CREAT RATIO: 20 (ref 9–23)
BUN: 12 mg/dL (ref 6–24)
Bilirubin Total: 0.4 mg/dL (ref 0.0–1.2)
CALCIUM: 9.6 mg/dL (ref 8.7–10.2)
CO2: 22 mmol/L (ref 20–29)
Chloride: 103 mmol/L (ref 96–106)
Creatinine, Ser: 0.6 mg/dL (ref 0.57–1.00)
GFR calc Af Amer: 128 mL/min/{1.73_m2} (ref >59)
GFR, EST NON AFRICAN AMERICAN: 111 mL/min/{1.73_m2} (ref >59)
GLOBULIN, TOTAL: 2.1 g/dL (ref 1.5–4.5)
Glucose: 95 mg/dL (ref 65–99)
POTASSIUM: 4.4 mmol/L (ref 3.5–5.2)
SODIUM: 142 mmol/L (ref 134–144)
Total Protein: 6.5 g/dL (ref 6.0–8.5)

## 2018-08-07 LAB — IRON AND TIBC
IRON: 68 ug/dL (ref 27–159)
Iron Saturation: 26 % (ref 15–55)
TIBC: 266 ug/dL (ref 250–450)
UIBC: 198 ug/dL (ref 131–425)

## 2018-08-07 LAB — VITAMIN B12: Vitamin B-12: 503 pg/mL (ref 232–1245)

## 2018-08-07 LAB — LIPID PANEL W/O CHOL/HDL RATIO
Cholesterol, Total: 158 mg/dL (ref 100–199)
HDL: 41 mg/dL (ref >39)
LDL Calculated: 95 mg/dL (ref 0–99)
TRIGLYCERIDES: 110 mg/dL (ref 0–149)
VLDL Cholesterol Cal: 22 mg/dL (ref 5–40)

## 2018-08-07 LAB — VITAMIN D 25 HYDROXY (VIT D DEFICIENCY, FRACTURES): VIT D 25 HYDROXY: 29.9 ng/mL — AB (ref 30.0–100.0)

## 2018-08-07 LAB — FERRITIN: Ferritin: 203 ng/mL — ABNORMAL HIGH (ref 15–150)

## 2018-08-07 LAB — HGB A1C W/O EAG: Hgb A1c MFr Bld: 5.4 % (ref 4.8–5.6)

## 2018-08-07 LAB — VITAMIN B1: Thiamine: 100.9 nmol/L (ref 66.5–200.0)

## 2018-08-07 LAB — VITAMIN A: Vitamin A: 34.5 ug/dL (ref 20.1–62.0)

## 2018-08-26 DIAGNOSIS — M1811 Unilateral primary osteoarthritis of first carpometacarpal joint, right hand: Secondary | ICD-10-CM | POA: Diagnosis not present

## 2018-08-26 DIAGNOSIS — G5603 Carpal tunnel syndrome, bilateral upper limbs: Secondary | ICD-10-CM | POA: Diagnosis not present

## 2018-09-04 DIAGNOSIS — R6889 Other general symptoms and signs: Secondary | ICD-10-CM | POA: Diagnosis not present

## 2018-09-04 DIAGNOSIS — R51 Headache: Secondary | ICD-10-CM | POA: Diagnosis not present

## 2018-09-04 DIAGNOSIS — E119 Type 2 diabetes mellitus without complications: Secondary | ICD-10-CM | POA: Diagnosis not present

## 2018-09-21 DIAGNOSIS — J029 Acute pharyngitis, unspecified: Secondary | ICD-10-CM | POA: Diagnosis not present

## 2018-09-21 DIAGNOSIS — Z880 Allergy status to penicillin: Secondary | ICD-10-CM | POA: Diagnosis not present

## 2018-09-21 DIAGNOSIS — R609 Edema, unspecified: Secondary | ICD-10-CM | POA: Diagnosis not present

## 2018-09-21 DIAGNOSIS — Z91040 Latex allergy status: Secondary | ICD-10-CM | POA: Diagnosis not present

## 2018-09-21 DIAGNOSIS — R1310 Dysphagia, unspecified: Secondary | ICD-10-CM | POA: Diagnosis not present

## 2018-09-21 DIAGNOSIS — Z885 Allergy status to narcotic agent status: Secondary | ICD-10-CM | POA: Diagnosis not present

## 2018-09-21 DIAGNOSIS — F17210 Nicotine dependence, cigarettes, uncomplicated: Secondary | ICD-10-CM | POA: Diagnosis not present

## 2018-09-21 DIAGNOSIS — R110 Nausea: Secondary | ICD-10-CM | POA: Diagnosis not present

## 2018-09-21 DIAGNOSIS — R638 Other symptoms and signs concerning food and fluid intake: Secondary | ICD-10-CM | POA: Diagnosis not present

## 2018-09-21 DIAGNOSIS — E119 Type 2 diabetes mellitus without complications: Secondary | ICD-10-CM | POA: Diagnosis not present

## 2018-09-21 DIAGNOSIS — J45909 Unspecified asthma, uncomplicated: Secondary | ICD-10-CM | POA: Diagnosis not present

## 2018-09-21 DIAGNOSIS — R51 Headache: Secondary | ICD-10-CM | POA: Diagnosis not present

## 2018-09-21 DIAGNOSIS — R070 Pain in throat: Secondary | ICD-10-CM | POA: Diagnosis not present

## 2018-09-23 ENCOUNTER — Telehealth: Payer: Self-pay | Admitting: General Surgery

## 2018-09-23 DIAGNOSIS — J029 Acute pharyngitis, unspecified: Secondary | ICD-10-CM | POA: Diagnosis not present

## 2018-09-23 DIAGNOSIS — F17210 Nicotine dependence, cigarettes, uncomplicated: Secondary | ICD-10-CM | POA: Diagnosis not present

## 2018-09-23 DIAGNOSIS — Z9884 Bariatric surgery status: Secondary | ICD-10-CM | POA: Diagnosis not present

## 2018-09-23 DIAGNOSIS — R0981 Nasal congestion: Secondary | ICD-10-CM | POA: Diagnosis not present

## 2018-09-23 DIAGNOSIS — J45909 Unspecified asthma, uncomplicated: Secondary | ICD-10-CM | POA: Diagnosis not present

## 2018-09-23 DIAGNOSIS — R509 Fever, unspecified: Secondary | ICD-10-CM | POA: Diagnosis not present

## 2018-09-23 DIAGNOSIS — R112 Nausea with vomiting, unspecified: Secondary | ICD-10-CM | POA: Diagnosis not present

## 2018-09-23 DIAGNOSIS — R197 Diarrhea, unspecified: Secondary | ICD-10-CM | POA: Diagnosis not present

## 2018-09-23 NOTE — Telephone Encounter (Signed)
Pt called with in c/o URI. Been to [facility] twice over past several days. Sore throat, trouble swallowing, poor PO due to difficulty swallow, feverish. Records reviewed - CT neck - fair amount of URI swelling inflammation; wbc 15. Pt states she got fluids yesterday so ok on hydration for now.  Flu and strep negative. Encouraged pt that if she was feeling worse to return to ED or call her pcp office.  She declined but she didn't want to pay any more money out of pocket since already been to ED twice. Told pt that if they wanted to Rx steroids to help with inflammation - it is ok for her to have short course of steroids. Should just avoid NSAIDS given bypass history.  Encouraged pt to reach out to her PCP. She states they wont rx anything without a visit. Discussed possibility of e-visit with her PCP.  Told pt we would arrange for IVF for later in week and if she was feeling better she could always cancel. Again encouraged pt to f/u/- call her pcp office. This seems entirely URI and not due to complication of gastric bypass.   Mary Sella. Andrey Campanile, MD, FACS General, Bariatric, & Minimally Invasive Surgery [facility], [place]

## 2018-09-25 ENCOUNTER — Encounter (HOSPITAL_COMMUNITY): Payer: Self-pay

## 2018-09-25 ENCOUNTER — Other Ambulatory Visit: Payer: Self-pay | Admitting: General Surgery

## 2018-09-30 DIAGNOSIS — Z1239 Encounter for other screening for malignant neoplasm of breast: Secondary | ICD-10-CM | POA: Diagnosis not present

## 2018-09-30 DIAGNOSIS — H9202 Otalgia, left ear: Secondary | ICD-10-CM | POA: Diagnosis not present

## 2018-09-30 DIAGNOSIS — R05 Cough: Secondary | ICD-10-CM | POA: Diagnosis not present

## 2018-09-30 DIAGNOSIS — J0100 Acute maxillary sinusitis, unspecified: Secondary | ICD-10-CM | POA: Diagnosis not present

## 2018-10-01 DIAGNOSIS — R200 Anesthesia of skin: Secondary | ICD-10-CM | POA: Diagnosis not present

## 2018-10-01 DIAGNOSIS — M542 Cervicalgia: Secondary | ICD-10-CM | POA: Diagnosis not present

## 2018-10-01 DIAGNOSIS — R202 Paresthesia of skin: Secondary | ICD-10-CM | POA: Diagnosis not present

## 2018-10-01 DIAGNOSIS — G629 Polyneuropathy, unspecified: Secondary | ICD-10-CM | POA: Diagnosis not present

## 2018-10-01 DIAGNOSIS — R252 Cramp and spasm: Secondary | ICD-10-CM | POA: Diagnosis not present

## 2018-10-01 DIAGNOSIS — M79601 Pain in right arm: Secondary | ICD-10-CM | POA: Diagnosis not present

## 2018-10-02 ENCOUNTER — Ambulatory Visit: Payer: Self-pay | Admitting: Skilled Nursing Facility1

## 2018-10-09 ENCOUNTER — Other Ambulatory Visit: Payer: Self-pay

## 2018-10-09 ENCOUNTER — Encounter: Payer: PPO | Attending: General Surgery | Admitting: Skilled Nursing Facility1

## 2018-10-09 DIAGNOSIS — Z713 Dietary counseling and surveillance: Secondary | ICD-10-CM | POA: Diagnosis not present

## 2018-10-09 DIAGNOSIS — Z6827 Body mass index (BMI) 27.0-27.9, adult: Secondary | ICD-10-CM | POA: Diagnosis not present

## 2018-10-09 DIAGNOSIS — E119 Type 2 diabetes mellitus without complications: Secondary | ICD-10-CM

## 2018-10-09 NOTE — Patient Instructions (Addendum)
A minimum of 50 grams of carbohydrate a day: sweet potato, french fry, corn, peas, any fruit, protein waffle; aim for at least one of these options at each meal (3 a day)  A minimum of 30 grams of fat per day (leaning more on unsaturated fat)  A minimum of 60 grams of protein a day

## 2018-10-09 NOTE — Progress Notes (Signed)
Post-Operative RYGB Surgery  Primary concerns today: Post-operative Bariatric Surgery Nutrition Management. Pt is talkative   Non-scale victories: wearing sizes 12/14 from sizes 22, no longer wearing plus sizes, went dancing with friends and less winded, biking with husband on the weekends  Pt states she struggles with bought's of extreme fatigue.  Pt states she is very afraid if she eats any carbohydrates at all she is going to gain all of her weight back.  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   Pt states she Needed 3 bags of fluid and pain medication due to upper respiratory disease and pharyngitis. Pt states she is really scared to add in carbohydrates stated her surgeon advsied her that was fine she does not need carbohydrates in her diet. Pt states she hates bread and states pasta is not tolerated in her body. Pt states she will not eat any fish because it is so gross. Pt states she can tolerate corn and sweet potatoes. Pt states she cannot tolerate hamburger meat. Pt states she likes the beyond burger. Pt states sugar will make her sick (vomiting).  Pt arrives with temporalis muscle loss.    Surgery date: 07/09/2017 Surgery type: RYGB Start weight at [facility]: 249.2 Weight today: 149.8 Weight Change: 12.8 Weight loss goal: did not want to shop in plus sizes anymore, didn't want family to ridicule her about her weight and size, increase energy  Body Composition Scale  Total Body Fat: 26.9 %  Visceral Fat: 5  Fat-Free Mass: 73  %   Total Body Water: 51  %  Muscle-Mass: 32.5 lbs  Body Fat Displacement:  Torso: 24.9  lbs Left Leg: 4.9  lbs Right Leg: 4.9 lbs Left Arm: 2.4  lbs Right Arm: 2.4 lbs   24-hr recall: having vegetables 2 times a day 7 days usually a week; 1 time a day dark chocolate  B (AM): ham and cheddar egg bites (13 CHO and 13 protein) Snk (AM): sugar free regular coffee with skim milk L (PM): grilled  chicken tenders with green beans or protein shake with 4 strawberries and PB fit Snk (PM): cheese stick (6g) or boiled egg or very rarley multigrain crackers D (PM): grilled shrimp or chicken or [place] with broccoli and cheese or green beans or somtimes corn every Friday: whole grain tortilla and small fry  Snk (PM): none  Fluid intake: 72-86 ounces water with crystal light  Estimated total protein intake: 60+  Medications: see list Supplementation: fusion capsule alternating with flinstones when her stomach cant handle the bari option and 3 bariatric advantage calcium    Using straws: no Drinking while eating: no Having you been chewing well: yes Chewing/swallowing difficulties: no Changes in vision: no Changes to mood/headaches: sometimes due to allergies and has not been taking medication Hair loss/Changes to skin/Changes to nails: getting thicker, no, no Any difficulty focusing or concentrating: no Sweating: no Dizziness/Lightheaded: no Palpitations: no  Carbonated beverages: no N/V/D/C/GAS: no Abdominal Pain: no Dumping syndrome: no  Recent physical activity:  3 days a week at the gym and 8 miles 3-4 times a week will increase to daily when the weather is nice   Progress Towards Goal(s):  In progress.  Handouts given during visit include:  Starchy vegetables + protein    Nutritional Diagnosis:  [place]-3.3 Overweight/obesity related to past poor dietary habits and physical inactivity as evidenced by patient w/ recent RYGB surgery following dietary guidelines for continued weight  loss.  Intervention:  Nutrition counseling. Dietitian educated the pt on advancing her diet to include starchy vegetables. A minimum of 50 grams of carbohydrate a day: sweet potato, french fry, corn, peas, any fruit, protein waffle; aim for at least one of these options at each meal (3 a day) A minimum of 30 grams of fat per day (leaning more on unsaturated fat) A minimum of 60 grams of protein a day    Teaching Method Utilized:  Visual Auditory Hands on  Barriers to learning/adherence to lifestyle change: none identified   Demonstrated degree of understanding via:  Teach Back   Monitoring/Evaluation:  Dietary intake, exercise, and body weight. 2 months from today

## 2018-11-11 DIAGNOSIS — R202 Paresthesia of skin: Secondary | ICD-10-CM | POA: Diagnosis not present

## 2018-11-11 DIAGNOSIS — R200 Anesthesia of skin: Secondary | ICD-10-CM | POA: Diagnosis not present

## 2018-12-02 DIAGNOSIS — H905 Unspecified sensorineural hearing loss: Secondary | ICD-10-CM | POA: Diagnosis not present

## 2018-12-02 DIAGNOSIS — H903 Sensorineural hearing loss, bilateral: Secondary | ICD-10-CM | POA: Diagnosis not present

## 2018-12-02 DIAGNOSIS — J310 Chronic rhinitis: Secondary | ICD-10-CM | POA: Diagnosis not present

## 2018-12-03 DIAGNOSIS — R202 Paresthesia of skin: Secondary | ICD-10-CM | POA: Diagnosis not present

## 2018-12-03 DIAGNOSIS — R200 Anesthesia of skin: Secondary | ICD-10-CM | POA: Diagnosis not present

## 2018-12-10 ENCOUNTER — Other Ambulatory Visit: Payer: Self-pay

## 2018-12-10 ENCOUNTER — Encounter: Payer: PPO | Attending: General Surgery | Admitting: Skilled Nursing Facility1

## 2018-12-10 DIAGNOSIS — Z9884 Bariatric surgery status: Secondary | ICD-10-CM | POA: Diagnosis not present

## 2018-12-10 DIAGNOSIS — Z713 Dietary counseling and surveillance: Secondary | ICD-10-CM | POA: Diagnosis not present

## 2018-12-10 DIAGNOSIS — E119 Type 2 diabetes mellitus without complications: Secondary | ICD-10-CM

## 2018-12-10 DIAGNOSIS — Z6827 Body mass index (BMI) 27.0-27.9, adult: Secondary | ICD-10-CM | POA: Insufficient documentation

## 2018-12-10 NOTE — Progress Notes (Signed)
Post-Operative RYGB Surgery  Primary concerns today: Post-operative Bariatric Surgery Nutrition Management. Pt is talkative   Non-scale victories: wearing sizes 12/14 from sizes 22, no longer wearing plus sizes, went dancing with friends and less winded, biking with husband on the weekends  Pt states she struggles with bought's of extreme fatigue.  Pt states she is very afraid if she eats any carbohydrates at all she is going to gain all of her weight back.  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  Pt state she has been eating carbs at about 50-60 grams a day. Pt states she really wants skin removal on her arms, butt, stomach.  Dietitian educated the fact on the fact she has lose skin contributing to her weight so it is not metabolically active due to pt maybe wanting to lose more weight.    Surgery date: 07/09/2017 Surgery type: RYGB [facility]: 249.2 Weight today: 148 Weight Change:  Weight loss goal: did not want to shop in plus sizes anymore, didn't want family to ridicule her about her weight and size, increase energy   Body Composition Scale  12/10/2018  Total Body Fat % 26.9 26.4  Visceral Fat 5 5  Fat-Free Mass % 73 73.5   Total Body Water % 51 51.2   Muscle-Mass lbs 31 32.5  Body Fat Displacement           Torso  lbs 24.9 24.1         Left Leg  lbs 4.9 4.8         Right Leg  lbs 4.9 4.8         Left Arm  lbs 2.4 2.4         Right Arm   lbs 2.4 2.4    24-hr recall: having vegetables 2 times a day 7 days usually a week; 1 time a day dark chocolate: averaging about 1490 calories out of 5 days reported with 2 days a week below 1000 calories  B (AM): ham and cheddar egg bites (13 CHO and 13 protein) Snk (AM): sugar free regular coffee with skim milk L (PM):  grilled chicken tenders with green beans or protein shake with 4 strawberries and PB fit Snk (PM): cheese stick (6g) or boiled egg or very rarley multigrain crackers D (PM): grilled shrimp or chicken or [place] with broccoli and cheese or green beans or somtimes corn every Friday: whole grain tortilla and small fry  Snk (PM): none  Fluid intake: 72-86 ounces water with crystal light  Estimated total protein intake: 60+  Medications: see list Supplementation: fusion capsule alternating with flinstones when her stomach cant handle the bari option and 3 bariatric advantage calcium; and vitamin C and vitamin d    Using straws: no Drinking while eating: no Having you been chewing well: yes Chewing/swallowing difficulties: no Changes in vision: no Changes to mood/headaches: sometimes due to allergies and has not been taking medication Hair loss/Changes to skin/Changes to nails: getting thicker, no, no Any difficulty focusing or concentrating: no Sweating: no Dizziness/Lightheaded: no Palpitations: no  Carbonated beverages: no N/V/D/C/GAS: no Abdominal Pain: no Dumping syndrome: no  Recent physical activity:  3 days a week at the gym and 8 miles 3-4 times a week will increase to daily  when the weather is nice: using weights while she walks.   Progress Towards Goal(s):  In progress.    Nutritional Diagnosis:  [place]-3.3 Overweight/obesity related to past poor dietary habits and physical inactivity as evidenced by patient w/ recent RYGB surgery following dietary guidelines for continued weight loss.  Intervention:  Nutrition counseling. Dietitian educated the pt on advancing her diet to include starchy vegetables. A minimum of 50 grams of carbohydrate a day: sweet potato, french fry, corn, peas, any fruit, protein waffle; aim for at least one of these options at each meal (3 a day) A minimum of 30 grams of fat per day (leaning more on unsaturated fat) A minimum of 60 grams of protein a day   Aim for 1200-1500 calories a day with 45% from CHO, 30% from protein and 25% from fat Do not go below your minimums   Teaching Method Utilized:  Visual Auditory Hands on  Barriers to learning/adherence to lifestyle change: none identified   Demonstrated degree of understanding via:  Teach Back   Monitoring/Evaluation:  Dietary intake, exercise, and body weight. 6 months from today

## 2019-01-12 DIAGNOSIS — H47393 Other disorders of optic disc, bilateral: Secondary | ICD-10-CM | POA: Diagnosis not present

## 2019-01-12 DIAGNOSIS — E236 Other disorders of pituitary gland: Secondary | ICD-10-CM | POA: Diagnosis not present

## 2019-01-12 DIAGNOSIS — H526 Other disorders of refraction: Secondary | ICD-10-CM | POA: Diagnosis not present

## 2019-03-18 DIAGNOSIS — J069 Acute upper respiratory infection, unspecified: Secondary | ICD-10-CM | POA: Diagnosis not present

## 2019-04-21 DIAGNOSIS — M7661 Achilles tendinitis, right leg: Secondary | ICD-10-CM | POA: Diagnosis not present

## 2019-04-21 DIAGNOSIS — M79671 Pain in right foot: Secondary | ICD-10-CM | POA: Diagnosis not present

## 2019-04-21 DIAGNOSIS — M7731 Calcaneal spur, right foot: Secondary | ICD-10-CM | POA: Diagnosis not present

## 2019-05-06 DIAGNOSIS — M7661 Achilles tendinitis, right leg: Secondary | ICD-10-CM | POA: Diagnosis not present

## 2019-05-06 DIAGNOSIS — M7731 Calcaneal spur, right foot: Secondary | ICD-10-CM | POA: Diagnosis not present

## 2019-05-07 ENCOUNTER — Other Ambulatory Visit: Payer: Self-pay | Admitting: Podiatry

## 2019-05-07 NOTE — Anesthesia Preprocedure Evaluation (Addendum)
Anesthesia Evaluation  Patient identified by MRN, date of birth, ID band Patient awake    Reviewed: Allergy & Precautions, NPO status , Patient's Chart, lab work & pertinent test results  History of Anesthesia Complications Negative for: history of anesthetic complications  Airway Mallampati: II  TM Distance: >3 FB Neck ROM: Full    Dental   Pulmonary asthma , COPD (Chronic bronchitis), Current Smoker and Patient abstained from smoking.,    breath sounds clear to auscultation       Cardiovascular (-) angina(-) DOE  Rhythm:Regular Rate:Normal     Neuro/Psych  Headaches, PSYCHIATRIC DISORDERS Anxiety  Neuromuscular disease (RLE neuropathy)    GI/Hepatic GERD  , IBS Hiatal hernia Gastritis S/p bariatric surgery   Endo/Other  diabetes  Renal/GU      Musculoskeletal  (+) Arthritis ,   Abdominal   Peds  Hematology  (+) Blood dyscrasia, anemia ,   Anesthesia Other Findings   Reproductive/Obstetrics                            Anesthesia Physical Anesthesia Plan  ASA: II  Anesthesia Plan: General   Post-op Pain Management:    Induction: Intravenous  PONV Risk Score and Plan: 2 and Ondansetron, Dexamethasone, Midazolam and Treatment may vary due to age or medical condition  Airway Management Planned: LMA  Additional Equipment:   Intra-op Plan:   Post-operative Plan: Extubation in OR  Informed Consent: I have reviewed the patients History and Physical, chart, labs and discussed the procedure including the risks, benefits and alternatives for the proposed anesthesia with the patient or authorized representative who has indicated his/her understanding and acceptance.       Plan Discussed with: CRNA and Anesthesiologist  Anesthesia Plan Comments:         Anesthesia Quick Evaluation

## 2019-05-11 ENCOUNTER — Other Ambulatory Visit
Admission: RE | Admit: 2019-05-11 | Discharge: 2019-05-11 | Disposition: A | Discharge status: Home / Self Care | Payer: PPO | Source: Ambulatory Visit | Attending: Podiatry | Admitting: Podiatry

## 2019-05-11 DIAGNOSIS — Z20828 Contact with and (suspected) exposure to other viral communicable diseases: Secondary | ICD-10-CM | POA: Insufficient documentation

## 2019-05-11 DIAGNOSIS — Z01812 Encounter for preprocedural laboratory examination: Secondary | ICD-10-CM | POA: Insufficient documentation

## 2019-05-11 LAB — SARS CORONAVIRUS 2 (TAT 6-24 HRS): SARS Coronavirus 2: NEGATIVE

## 2019-05-14 ENCOUNTER — Encounter
Admission: RE | Disposition: A | Discharge status: Home / Self Care | Payer: Self-pay | Source: Home / Self Care | Attending: Podiatry

## 2019-05-14 ENCOUNTER — Ambulatory Visit
Admission: RE | Admit: 2019-05-14 | Discharge: 2019-05-14 | Disposition: A | Discharge status: Home / Self Care | Payer: 59 | Attending: Podiatry | Admitting: Podiatry

## 2019-05-14 ENCOUNTER — Other Ambulatory Visit: Payer: Self-pay

## 2019-05-14 ENCOUNTER — Ambulatory Visit: Payer: 59 | Admitting: Anesthesiology

## 2019-05-14 DIAGNOSIS — M1990 Unspecified osteoarthritis, unspecified site: Secondary | ICD-10-CM | POA: Insufficient documentation

## 2019-05-14 DIAGNOSIS — M7661 Achilles tendinitis, right leg: Secondary | ICD-10-CM | POA: Diagnosis not present

## 2019-05-14 DIAGNOSIS — E119 Type 2 diabetes mellitus without complications: Secondary | ICD-10-CM | POA: Insufficient documentation

## 2019-05-14 DIAGNOSIS — Z79899 Other long term (current) drug therapy: Secondary | ICD-10-CM | POA: Diagnosis not present

## 2019-05-14 DIAGNOSIS — M899 Disorder of bone, unspecified: Secondary | ICD-10-CM | POA: Insufficient documentation

## 2019-05-14 DIAGNOSIS — K219 Gastro-esophageal reflux disease without esophagitis: Secondary | ICD-10-CM | POA: Insufficient documentation

## 2019-05-14 DIAGNOSIS — Z888 Allergy status to other drugs, medicaments and biological substances status: Secondary | ICD-10-CM | POA: Insufficient documentation

## 2019-05-14 DIAGNOSIS — Z881 Allergy status to other antibiotic agents status: Secondary | ICD-10-CM | POA: Insufficient documentation

## 2019-05-14 DIAGNOSIS — J449 Chronic obstructive pulmonary disease, unspecified: Secondary | ICD-10-CM | POA: Diagnosis not present

## 2019-05-14 DIAGNOSIS — M25571 Pain in right ankle and joints of right foot: Secondary | ICD-10-CM | POA: Diagnosis not present

## 2019-05-14 DIAGNOSIS — M7731 Calcaneal spur, right foot: Secondary | ICD-10-CM | POA: Diagnosis not present

## 2019-05-14 DIAGNOSIS — Z880 Allergy status to penicillin: Secondary | ICD-10-CM | POA: Insufficient documentation

## 2019-05-14 DIAGNOSIS — Z885 Allergy status to narcotic agent status: Secondary | ICD-10-CM | POA: Diagnosis not present

## 2019-05-14 DIAGNOSIS — G8918 Other acute postprocedural pain: Secondary | ICD-10-CM | POA: Diagnosis not present

## 2019-05-14 DIAGNOSIS — Z87891 Personal history of nicotine dependence: Secondary | ICD-10-CM | POA: Diagnosis not present

## 2019-05-14 DIAGNOSIS — Z886 Allergy status to analgesic agent status: Secondary | ICD-10-CM | POA: Insufficient documentation

## 2019-05-14 HISTORY — PX: CALCANEAL OSTEOTOMY: SHX1281

## 2019-05-14 HISTORY — PX: ACHILLES TENDON SURGERY: SHX542

## 2019-05-14 SURGERY — REPAIR, TENDON, ACHILLES
Anesthesia: General | Site: Foot | Laterality: Right

## 2019-05-14 MED ORDER — MEPERIDINE HCL 25 MG/ML IJ SOLN: 6.2500 mg | INTRAMUSCULAR | Status: DC | PRN

## 2019-05-14 MED ORDER — PHENYLEPHRINE HCL (PRESSORS) 10 MG/ML IV SOLN
INTRAVENOUS | Status: DC | PRN
  Administered 2019-05-14 (×4): 100 ug via INTRAVENOUS

## 2019-05-14 MED ORDER — LACTATED RINGERS IV SOLN
100.0000 mL/h | INTRAVENOUS | Status: DC
  Administered 2019-05-14: 100 mL/h via INTRAVENOUS

## 2019-05-14 MED ORDER — ONDANSETRON HCL 4 MG/2ML IJ SOLN
INTRAMUSCULAR | Status: DC | PRN
  Administered 2019-05-14: 4 mg via INTRAVENOUS

## 2019-05-14 MED ORDER — PROMETHAZINE HCL 50 MG PO TABS: 50.0000 mg | ORAL_TABLET | Freq: Four times a day (QID) | ORAL | 0 refills | Status: DC | PRN

## 2019-05-14 MED ORDER — MIDAZOLAM HCL 5 MG/5ML IJ SOLN
INTRAMUSCULAR | Status: DC | PRN
  Administered 2019-05-14 (×2): 2 mg via INTRAVENOUS

## 2019-05-14 MED ORDER — GLYCOPYRROLATE 0.2 MG/ML IJ SOLN
INTRAMUSCULAR | Status: DC | PRN
  Administered 2019-05-14: 0.2 mg via INTRAVENOUS

## 2019-05-14 MED ORDER — OXYCODONE HCL 5 MG/5ML PO SOLN: 5.0000 mg | Freq: Once | ORAL | Status: DC | PRN

## 2019-05-14 MED ORDER — PROMETHAZINE HCL 25 MG/ML IJ SOLN: 6.2500 mg | INTRAMUSCULAR | Status: DC | PRN

## 2019-05-14 MED ORDER — OXYCODONE HCL 5 MG PO TABS: 5.0000 mg | ORAL_TABLET | Freq: Once | ORAL | Status: DC | PRN

## 2019-05-14 MED ORDER — DEXAMETHASONE SODIUM PHOSPHATE 4 MG/ML IJ SOLN
INTRAMUSCULAR | Status: DC | PRN
  Administered 2019-05-14: 4 mg via INTRAVENOUS

## 2019-05-14 MED ORDER — ROPIVACAINE HCL 5 MG/ML IJ SOLN
INTRAMUSCULAR | Status: DC | PRN
  Administered 2019-05-14: 40 mL via EPIDURAL

## 2019-05-14 MED ORDER — POVIDONE-IODINE 7.5 % EX SOLN
Freq: Once | CUTANEOUS | Status: AC
  Administered 2019-05-14: 07:00:00 via TOPICAL

## 2019-05-14 MED ORDER — LIDOCAINE HCL (CARDIAC) PF 100 MG/5ML IV SOSY
PREFILLED_SYRINGE | INTRAVENOUS | Status: DC | PRN
  Administered 2019-05-14: 50 mg via INTRATRACHEAL

## 2019-05-14 MED ORDER — DEXMEDETOMIDINE HCL 200 MCG/2ML IV SOLN
INTRAVENOUS | Status: DC | PRN
  Administered 2019-05-14: 10 ug via INTRAVENOUS

## 2019-05-14 MED ORDER — FENTANYL CITRATE (PF) 100 MCG/2ML IJ SOLN
INTRAMUSCULAR | Status: DC | PRN
  Administered 2019-05-14: 100 ug via INTRAVENOUS
  Administered 2019-05-14 (×2): 50 ug via INTRAVENOUS

## 2019-05-14 MED ORDER — SUCCINYLCHOLINE 20MG/ML (10ML) SYRINGE FOR MEDFUSION PUMP - OPTIME
INTRAMUSCULAR | Status: DC | PRN
  Administered 2019-05-14: 80 mg via INTRAVENOUS

## 2019-05-14 MED ORDER — PROPOFOL 10 MG/ML IV BOLUS
INTRAVENOUS | Status: DC | PRN
  Administered 2019-05-14: 200 mg via INTRAVENOUS

## 2019-05-14 MED ORDER — HYDROMORPHONE HCL 1 MG/ML IJ SOLN: 0.2500 mg | INTRAMUSCULAR | Status: DC | PRN

## 2019-05-14 MED ORDER — HYDROCODONE-ACETAMINOPHEN 7.5-325 MG PO TABS: 1.0000 | ORAL_TABLET | Freq: Four times a day (QID) | ORAL | 0 refills | Status: DC | PRN

## 2019-05-14 MED ORDER — CLINDAMYCIN PHOSPHATE 900 MG/50ML IV SOLN
900.0000 mg | INTRAVENOUS | Status: AC
  Administered 2019-05-14: 900 mg via INTRAVENOUS

## 2019-05-14 SURGICAL SUPPLY — 48 items
ANCH SUT 2 2.9 2 LD TPR NDL (Anchor) ×1 IMPLANT
ANCHOR JUGGERKNOT WTAP NDL 2.9 (Anchor) ×2 IMPLANT
BANDAGE ELASTIC 4 LF NS (GAUZE/BANDAGES/DRESSINGS) ×6 IMPLANT
BIT DRILL JUGRKNT W/NDL BIT2.9 (DRILL) IMPLANT
BLADE OSCILLATING/SAGITTAL (BLADE) ×3
BLADE SURG 15 STRL LF DISP TIS (BLADE) IMPLANT
BLADE SURG 15 STRL SS (BLADE) ×3
BLADE SURG MINI STRL (BLADE) IMPLANT
BLADE SW THK.38XMED LNG THN (BLADE) IMPLANT
BNDG CMPR 75X41 PLY HI ABS (GAUZE/BANDAGES/DRESSINGS) ×1
BNDG CMPR MED 5X4 ELC HKLP NS (GAUZE/BANDAGES/DRESSINGS) ×2
BNDG ESMARK 4X12 TAN STRL LF (GAUZE/BANDAGES/DRESSINGS) ×3 IMPLANT
BNDG GAUZE 4.5X4.1 6PLY STRL (MISCELLANEOUS) ×3 IMPLANT
BNDG STRETCH 4X75 STRL LF (GAUZE/BANDAGES/DRESSINGS) ×3 IMPLANT
CANISTER SUCT 1200ML W/VALVE (MISCELLANEOUS) ×3 IMPLANT
CLOSURE WOUND 1/4X4 (GAUZE/BANDAGES/DRESSINGS) ×1
COVER LIGHT HANDLE UNIVERSAL (MISCELLANEOUS) ×6 IMPLANT
CUFF TOURN SGL QUICK 24 (TOURNIQUET CUFF) ×3
CUFF TRNQT CYL 24X4X40X1 (TOURNIQUET CUFF) ×1 IMPLANT
DRILL JUGGERKNOT W/NDL BIT 2.9 (DRILL) ×3
DURAPREP 26ML APPLICATOR (WOUND CARE) ×3 IMPLANT
ELECT REM PT RETURN 9FT ADLT (ELECTROSURGICAL) ×3
ELECTRODE REM PT RTRN 9FT ADLT (ELECTROSURGICAL) ×1 IMPLANT
GAUZE SPONGE 4X4 12PLY STRL (GAUZE/BANDAGES/DRESSINGS) ×3 IMPLANT
GAUZE XEROFORM 1X8 LF (GAUZE/BANDAGES/DRESSINGS) ×3 IMPLANT
GLOVE BIO SURGEON STRL SZ8 (GLOVE) ×3 IMPLANT
GOWN STRL REUS W/ TWL LRG LVL3 (GOWN DISPOSABLE) ×1 IMPLANT
GOWN STRL REUS W/ TWL XL LVL3 (GOWN DISPOSABLE) ×1 IMPLANT
GOWN STRL REUS W/TWL LRG LVL3 (GOWN DISPOSABLE) ×3
GOWN STRL REUS W/TWL XL LVL3 (GOWN DISPOSABLE) ×3
KIT TURNOVER KIT A (KITS) ×3 IMPLANT
NDL HYPO 25GX1X1/2 BEV (NEEDLE) ×3 IMPLANT
NEEDLE HYPO 25GX1X1/2 BEV (NEEDLE) ×9 IMPLANT
NS IRRIG 500ML POUR BTL (IV SOLUTION) ×3 IMPLANT
PACK EXTREMITY ARMC (MISCELLANEOUS) ×3 IMPLANT
PADDING CAST BLEND 4X4 NS (MISCELLANEOUS) ×9 IMPLANT
PENCIL SMOKE EVACUATOR (MISCELLANEOUS) ×3 IMPLANT
RASP SM TEAR CROSS CUT (RASP) ×2 IMPLANT
SPLINT CAST 1 STEP 4X30 (MISCELLANEOUS) ×3 IMPLANT
SPLINT FAST PLASTER 5X30 (CAST SUPPLIES) ×2
SPLINT PLASTER CAST FAST 5X30 (CAST SUPPLIES) ×1 IMPLANT
STOCKINETTE STRL 6IN 960660 (GAUZE/BANDAGES/DRESSINGS) ×3 IMPLANT
STRIP CLOSURE SKIN 1/4X4 (GAUZE/BANDAGES/DRESSINGS) ×2 IMPLANT
SUT VIC AB 2-0 SH 27 (SUTURE) ×3
SUT VIC AB 2-0 SH 27XBRD (SUTURE) IMPLANT
SUT VIC AB 4-0 SH 27 (SUTURE) ×3
SUT VIC AB 4-0 SH 27XANBCTRL (SUTURE) IMPLANT
SYR 10ML LL (SYRINGE) ×6 IMPLANT

## 2019-05-14 NOTE — Anesthesia Procedure Notes (Signed)
Anesthesia Regional Block: Adductor canal block   Pre-Anesthetic Checklist: ,, timeout performed, Correct Patient, Correct Site, Correct Laterality, Correct Procedure, Correct Position, site marked, Risks and benefits discussed,  Surgical consent,  Pre-op evaluation,  At surgeon's request and post-op pain management  Laterality: Right  Prep: chloraprep       Needles:  Injection technique: Single-shot  Needle Type: Echogenic Needle     Needle Length: 9cm  Needle Gauge: 21     Additional Needles:   Procedures:,,,, ultrasound used (permanent image in chart),,,,  Narrative:  Injection made incrementally with aspirations every 5 mL.  Performed by: Personally   Additional Notes: Functioning IV was confirmed and monitors applied. Ultrasound guidance: relevant anatomy identified, needle position confirmed, local anesthetic spread visualized around nerve(s)., vascular puncture avoided.  Image printed for medical record.  Negative aspiration and no paresthesias; incremental administration of local anesthetic. The patient tolerated the procedure well. Vitals signes recorded in RN notes.      

## 2019-05-14 NOTE — Discharge Instructions (Signed)
[facility]  POST OPERATIVE INSTRUCTIONS FOR DR. Alyssandra Hulsebus, DR. Ether Griffins, AND [facility]   1. Take your medication as prescribed.  Pain medication should be taken only as needed.  The nausea medicine prescribed his Phenergan is a 50 mg tablet I would suggest dividing it in half and taking only half and see how that works.  He can take full amount if needed.  2. Keep the dressing clean, dry and intact.  3. Keep your foot elevated above the heart level for the first 48 hours.  4.  we have instructed you to be non-weight bearing.  Please use appropriate devices such as crutches and the knee rest scooter to remain nonweightbearing  5. .  Always use your crutches or scooter if you are to be non-weight bearing.  6. Do get your foot wet. Baths or are permissible as long as the foot is kept out of the water and you remain seated and no weightbearing on the right foot..   7. Every hour you are awake:  - Bend your knee 15 times. - Flex foot 15 times - Massage calf 15 times  8. Call [facility] [phone]) if any of the following problems occur: - You develop a temperature or fever. - The bandage becomes saturated with blood. - Medication does not stop your pain. - Injury of the foot occurs. - Any symptoms of infection including redness, odor, or red streaks running from wound.   General Anesthesia, Adult, Care After This sheet gives you information about how to care for yourself after your procedure. Your health care provider may also give you more specific instructions. If you have problems or questions, contact your health care provider. What can I expect after the procedure? After the procedure, the following side effects are common:  Pain or discomfort at the IV site.  Nausea.  Vomiting.  Sore throat.  Trouble concentrating.  Feeling cold or chills.  Weak or tired.  Sleepiness and  fatigue.  Soreness and body aches. These side effects can affect parts of the body that were not involved in surgery. Follow these instructions at home:  For at least 24 hours after the procedure:  Have a responsible adult stay with you. It is important to have someone help care for you until you are awake and alert.  Rest as needed.  Do not: ? Participate in activities in which you could fall or become injured. ? Drive. ? Use heavy machinery. ? Drink alcohol. ? Take sleeping pills or medicines that cause drowsiness. ? Make important decisions or sign legal documents. ? Take care of children on your own. Eating and drinking  Follow any instructions from your health care provider about eating or drinking restrictions.  When you feel hungry, start by eating small amounts of foods that are soft and easy to digest (bland), such as toast. Gradually return to your regular diet.  Drink enough fluid to keep your urine pale yellow.  If you vomit, rehydrate by drinking water, juice, or clear broth. General instructions  If you have sleep apnea, surgery and certain medicines can increase your risk for breathing problems. Follow instructions from your health care provider about wearing your sleep device: ? Anytime you are sleeping, including during daytime naps. ? While taking prescription pain medicines, sleeping medicines, or medicines that make you drowsy.  Return to your normal activities as told by your health care provider. Ask your health care provider what activities  are safe for you.  Take over-the-counter and prescription medicines only as told by your health care provider.  If you smoke, do not smoke without supervision.  Keep all follow-up visits as told by your health care provider. This is important. Contact a health care provider if:  You have nausea or vomiting that does not get better with medicine.  You cannot eat or drink without vomiting.  You have pain that  does not get better with medicine.  You are unable to pass urine.  You develop a skin rash.  You have a fever.  You have redness around your IV site that gets worse. Get help right away if:  You have difficulty breathing.  You have chest pain.  You have blood in your urine or stool, or you vomit blood. Summary  After the procedure, it is common to have a sore throat or nausea. It is also common to feel tired.  Have a responsible adult stay with you for the first 24 hours after general anesthesia. It is important to have someone help care for you until you are awake and alert.  When you feel hungry, start by eating small amounts of foods that are soft and easy to digest (bland), such as toast. Gradually return to your regular diet.  Drink enough fluid to keep your urine pale yellow.  Return to your normal activities as told by your health care provider. Ask your health care provider what activities are safe for you. This information is not intended to replace advice given to you by your health care provider. Make sure you discuss any questions you have with your health care provider. Document Released: 10/22/2000 Document Revised: 07/19/2017 Document Reviewed: 03/01/2017 Elsevier Patient Education  2020 [employer].

## 2019-05-14 NOTE — H&P (Signed)
H and P has been reviewed and no changes are noted.  

## 2019-05-14 NOTE — Transfer of Care (Signed)
Immediate Anesthesia Transfer of Care Note  Patient: [name]  Procedure(s) Performed: ACHILLES REPAIR-SECONDARY (Right Foot) HAGLUNDS/RETROCALCANEAL (Right Foot)  Patient Location: PACU  Anesthesia Type: General  Level of Consciousness: awake, alert  and patient cooperative  Airway and Oxygen Therapy: Patient Spontanous Breathing and Patient connected to supplemental oxygen  Post-op Assessment: Post-op Vital signs reviewed, Patient's Cardiovascular Status Stable, Respiratory Function Stable, Patent Airway and No signs of Nausea or vomiting  Post-op Vital Signs: Reviewed and stable  Complications: No apparent anesthesia complications

## 2019-05-14 NOTE — Anesthesia Postprocedure Evaluation (Signed)
Anesthesia Post Note  Patient: [name]  Procedure(s) Performed: ACHILLES REPAIR-SECONDARY (Right Foot) HAGLUNDS/RETROCALCANEAL (Right Foot)  Patient location during evaluation: PACU Anesthesia Type: General Level of consciousness: awake and alert Pain management: pain level controlled Vital Signs Assessment: post-procedure vital signs reviewed and stable Respiratory status: spontaneous breathing, nonlabored ventilation, respiratory function stable and patient connected to nasal cannula oxygen Cardiovascular status: blood pressure returned to baseline and stable Postop Assessment: no apparent nausea or vomiting Anesthetic complications: no    Jerilynn Feldmeier A  Ayahna Solazzo

## 2019-05-14 NOTE — Op Note (Signed)
Operative note   Surgeon: Dr. Albertine Patricia, DPM.    Assistant: None    Preop diagnosis: 1.  Posterior calcaneal exostosis right heel 2.  Chronic Achilles tendinitis right heel    Postop diagnosis: Same    Procedure:   1.  Secondary Achilles tendon repair right heel with 2.9 mm juggernaut tendon anchor   2.  Excision of calcaneal exostosis right       EBL: LESS than 5 cc    Anesthesia:general delivered by the anesthesia team with a popliteal block also delivered by anesthesia team preoperatively    Hemostasis: Thigh tourniquet at 275 mils mercury pressure for 40 minutes    Specimen: None    Complications: None    Operative indications: Chronic pain unresponsive to conservative care    Procedure:  Patient was brought into the OR and placed on the operating table in theprone position. After anesthesia was obtained theright lower extremity was prepped and draped in usual sterile fashion.  Operative Report: The patient right limb was checked for marking and attention directed to the posterior calcaneus where a vertical incision was made along the Achilles tendon and posterior calcaneus.  Incision was deepened sharp dissection bleeders clamped and bovied as required.  Superficial deep fascial layers were retracted medially and laterally and incision was made through the tendon sheath and periosteum carried distally.  These tissues were then carefully dissected and retracted medial laterally.  Incision made at this time through the Achilles tendon down to the posterior calcaneus and carried distally as well.  A prominence of bone was noted this was identified in the soft tissue and tendon was dissected away from the bone.  A combination of a sagittal saw and power rasp were utilized to remove all the bone proliferation from the Achilles insertion site.  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  All the  posterior aspect was then rasped smoothly at the lateral bone was identified.  There was an copiously irrigated and a tendon anchor utilizing a 2.9 mm juggernaut tendon anchor was then placed into the calcaneus and used to suture the Achilles tendon back down to bone.  This has 2 separate sutures coming off the area to allow for appropriate anchoring.  After further irrigation the tendon incision was closed with 2-0 Vicryl in a continuous stitch.  The distal portions of the tendon were reattached with the running suture of 2-0 Vicryl also.  At this point the deep superficial fascial layers along with the peritenon and the tendon sheath were closed with a 4-0 Vicryl continuous suture.  Skin was closed with 4-0 Vicryl in a subcuticular stitch.  A sterile compressive dressings were placed across the heel consisting of Steri-Strips Xeroform gauze 4 x 4's conformer and Kerlix.  A posterior splint was placed on the right foot and leg in the operating room with the foot plantar flexed.  She is to remain nonweightbearing.    Patient tolerated the procedure and anesthesia well.  Was transported from the OR to the PACU with all vital signs stable and vascular status intact. To be discharged per routine protocol.  Will follow up in approximately 1 week in the outpatient clinic.

## 2019-05-14 NOTE — Anesthesia Procedure Notes (Signed)
Anesthesia Regional Block: Popliteal block   Pre-Anesthetic Checklist: ,, timeout performed, Correct Patient, Correct Site, Correct Laterality, Correct Procedure, Correct Position, site marked, Risks and benefits discussed,  Surgical consent,  Pre-op evaluation,  At surgeon's request and post-op pain management  Laterality: Right  Prep: chloraprep       Needles:  Injection technique: Single-shot  Needle Type: Echogenic Needle     Needle Length: 9cm  Needle Gauge: 21     Additional Needles:   Procedures:,,,, ultrasound used (permanent image in chart),,,,  Narrative:  Injection made incrementally with aspirations every 5 mL.  Performed by: Personally   Additional Notes: Functioning IV was confirmed and monitors applied. Ultrasound guidance: relevant anatomy identified, needle position confirmed, local anesthetic spread visualized around nerve(s)., vascular puncture avoided.  Image printed for medical record.  Negative aspiration and no paresthesias; incremental administration of local anesthetic. The patient tolerated the procedure well. Vitals signes recorded in RN notes.      

## 2019-05-14 NOTE — Anesthesia Procedure Notes (Signed)
Procedure Name: Intubation Date/Time: 05/14/2019 7:44 AM Performed by: Jeannene Patella, CRNA Pre-anesthesia Checklist: Patient identified, Emergency Drugs available, Suction available, Patient being monitored and Timeout performed Patient Re-evaluated:Patient Re-evaluated prior to induction Oxygen Delivery Method: Circle system utilized Preoxygenation: Pre-oxygenation with 100% oxygen Induction Type: IV induction Ventilation: Mask ventilation without difficulty Laryngoscope Size: Mac and 3 Grade View: Grade II Tube type: Oral Tube size: 6.5 mm Number of attempts: 1 Placement Confirmation: ETT inserted through vocal cords under direct vision,  positive ETCO2 and breath sounds checked- equal and bilateral Tube secured with: Tape Dental Injury: Teeth and Oropharynx as per pre-operative assessment

## 2019-05-14 NOTE — Progress Notes (Signed)
Assisted [facility] ANMD with right, ultrasound guided, popliteal, adductor canal block. Side rails up, monitors on throughout procedure. See vital signs in flow sheet. Tolerated Procedure well.

## 2019-05-15 ENCOUNTER — Encounter: Payer: Self-pay | Admitting: Podiatry

## 2019-05-20 DIAGNOSIS — M7661 Achilles tendinitis, right leg: Secondary | ICD-10-CM | POA: Diagnosis not present

## 2019-05-20 DIAGNOSIS — M7731 Calcaneal spur, right foot: Secondary | ICD-10-CM | POA: Diagnosis not present

## 2019-05-27 DIAGNOSIS — M7731 Calcaneal spur, right foot: Secondary | ICD-10-CM | POA: Diagnosis not present

## 2019-05-27 DIAGNOSIS — M7661 Achilles tendinitis, right leg: Secondary | ICD-10-CM | POA: Diagnosis not present

## 2019-06-05 DIAGNOSIS — M7731 Calcaneal spur, right foot: Secondary | ICD-10-CM | POA: Diagnosis not present

## 2019-06-05 DIAGNOSIS — Z09 Encounter for follow-up examination after completed treatment for conditions other than malignant neoplasm: Secondary | ICD-10-CM | POA: Diagnosis not present

## 2019-06-15 ENCOUNTER — Ambulatory Visit: Payer: PPO | Admitting: Skilled Nursing Facility1

## 2019-07-06 ENCOUNTER — Ambulatory Visit: Payer: PPO | Admitting: Dietician

## 2019-07-06 DIAGNOSIS — Z09 Encounter for follow-up examination after completed treatment for conditions other than malignant neoplasm: Secondary | ICD-10-CM | POA: Diagnosis not present

## 2019-07-06 DIAGNOSIS — M216X2 Other acquired deformities of left foot: Secondary | ICD-10-CM | POA: Diagnosis not present

## 2019-07-06 DIAGNOSIS — M216X1 Other acquired deformities of right foot: Secondary | ICD-10-CM | POA: Diagnosis not present

## 2019-07-07 ENCOUNTER — Encounter: Payer: Self-pay | Admitting: Skilled Nursing Facility1

## 2019-07-07 ENCOUNTER — Other Ambulatory Visit: Payer: Self-pay

## 2019-07-07 ENCOUNTER — Encounter: Payer: 59 | Attending: General Surgery | Admitting: Skilled Nursing Facility1

## 2019-07-07 DIAGNOSIS — E669 Obesity, unspecified: Secondary | ICD-10-CM | POA: Diagnosis not present

## 2019-07-07 DIAGNOSIS — Z9884 Bariatric surgery status: Secondary | ICD-10-CM | POA: Insufficient documentation

## 2019-07-07 NOTE — Progress Notes (Signed)
Post-Operative RYGB Surgery  Primary concerns today: Post-operative Bariatric Surgery Nutrition Management.  Pt reports intolerance to sugar (heart palpitations, flushing). Pt reports weaning off of protein shakes. 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 Pt reports having a bad experience drinking alcohol with sister, reports she will never drink again. Pt reports loving beans, can not tolerate beef (stomach upset/knot). Pt reports eating a lot of chicken, shrimp, crab legs. Pt reports eating in the order of meat, veggies, then starches. Pt reports an event of missing lunch due to being too busy, then listening to her body's hunger signal and ate immediately. Pt is very aware of her body's signals Pt reports having multiple friends with bariatric surgery who have regained weight. Reports she does not want to be like them. Pt reports liking being a size 4, is very happy with her body's changes. Pt reports planning to go to plastic surgeon to remove excess skin. Pt reports surgeon has not followed up with her since surgery.  Pt presents very slight temporal and clavical wasting. Hair and nails look healthy.  Surgery date: 07/09/2017 Surgery type: RYGB Start weight at [facility]: 249.2 Weight today: 139.6 Weight Change: 109.6 Weight loss goal: Pt report being happy at current weight. Reports planning to see plastic surgeon to remove excess skin.   Body Composition Scale  12/10/2018 12/8/ 2020  Total Body Fat % 26.9 26.4 23.9  Visceral Fat 5 5 4   Fat-Free Mass % 73 73.5 76   Total Body Water % 51 51.2 52.5   Muscle-Mass lbs 31 32.5 32.3  Body Fat Displacement            Torso  lbs 24.9 24.1 20.5         Left Leg  lbs 4.9 4.8 4.1         Right Leg  lbs 4.9 4.8 4.1         Left Arm  lbs 2.4 2.4 2.0         Right Arm   lbs 2.4 2.4 2.0    24-hr recall:  B (AM): protein shake Snk (AM): Peppers and cream cheese L (PM): small piece of chicken, green beans, sweet potato casserole Snk (PM):   Pretzels and dark chocolate, carrots celery, ranch D (PM):small piece of chicken, green beans, sweet potato casserole  Snk (PM): pretzels or popcorn  Reports snacking throughout the day (pretzels, popcorn, dark chocolate)  Fluid intake: 72-86 ounces water, unsweet tea Estimated total protein intake: 60+  Medications: see list Supplementation: Bariatric chewables, flintstones vitamin, lemon bariatric calcium chews. Using straws: no Drinking while eating: no Having you been chewing well: yes Chewing/swallowing difficulties: no Changes in vision: no Changes to mood/headaches: sometimes due to allergies and has not been taking medication Hair loss/Changes to skin/Changes to nails: getting thicker, no, no Any difficulty focusing or concentrating: no Sweating: no Dizziness/Lightheaded: no Palpitations: no  Carbonated beverages: no N/V/D/C/GAS: no Abdominal Pain: no Dumping syndrome: no  Recent physical activity:  ADLs. Pt in a boot due to previous foot surgery.  Progress Towards Goal(s):  In progress.    Nutritional Diagnosis:  [place]-3.3 Overweight/obesity related to past poor dietary habits and physical inactivity as evidenced by patient w/ recent RYGB surgery following dietary guidelines for continued weight loss.  Intervention:  Nutrition counseling.   Dietitian educated the pt to continue increased snacking of healthy, balanced foods every 1 to 2 hours. Dietitian encouraged Pt to continue consistent carbohydrate intake due to seemingly hypermetabolic state. A minimum of  50 grams of carbohydrate a day: sweet potato, french fry, corn, peas, any fruit, protein waffle; aim for at least one of these options at each meal (3 a day) A minimum of 30 grams of fat per day (leaning more on unsaturated fat) A minimum of 60 grams of protein a day  Aim for 1200-1500 calories a day with 45% from CHO, 30% from protein and 25% from fat Do not go below your minimums   Teaching Method Utilized:   Visual Auditory Hands on  Barriers to learning/adherence to lifestyle change: none identified   Demonstrated degree of understanding via:  Teach Back   Monitoring/Evaluation:  Dietary intake, exercise, and body weight. 3 months from today

## 2019-09-02 DIAGNOSIS — M7661 Achilles tendinitis, right leg: Secondary | ICD-10-CM | POA: Diagnosis not present

## 2019-09-02 DIAGNOSIS — Z09 Encounter for follow-up examination after completed treatment for conditions other than malignant neoplasm: Secondary | ICD-10-CM | POA: Diagnosis not present

## 2019-09-02 DIAGNOSIS — M7731 Calcaneal spur, right foot: Secondary | ICD-10-CM | POA: Diagnosis not present

## 2019-09-22 ENCOUNTER — Other Ambulatory Visit: Payer: Self-pay | Admitting: Plastic Surgery

## 2019-09-22 DIAGNOSIS — Z1231 Encounter for screening mammogram for malignant neoplasm of breast: Secondary | ICD-10-CM

## 2019-09-23 ENCOUNTER — Other Ambulatory Visit: Payer: Self-pay

## 2019-09-23 ENCOUNTER — Ambulatory Visit
Admission: RE | Admit: 2019-09-23 | Discharge: 2019-09-23 | Disposition: A | Discharge status: Home / Self Care | Payer: PPO | Source: Ambulatory Visit | Attending: Plastic Surgery | Admitting: Plastic Surgery

## 2019-09-23 DIAGNOSIS — Z1231 Encounter for screening mammogram for malignant neoplasm of breast: Secondary | ICD-10-CM | POA: Diagnosis not present

## 2019-09-28 DIAGNOSIS — B9689 Other specified bacterial agents as the cause of diseases classified elsewhere: Secondary | ICD-10-CM | POA: Diagnosis not present

## 2019-09-28 DIAGNOSIS — R05 Cough: Secondary | ICD-10-CM | POA: Diagnosis not present

## 2019-09-28 DIAGNOSIS — J019 Acute sinusitis, unspecified: Secondary | ICD-10-CM | POA: Diagnosis not present

## 2019-09-28 DIAGNOSIS — J4520 Mild intermittent asthma, uncomplicated: Secondary | ICD-10-CM | POA: Diagnosis not present

## 2019-09-30 DIAGNOSIS — J209 Acute bronchitis, unspecified: Secondary | ICD-10-CM | POA: Diagnosis not present

## 2019-09-30 DIAGNOSIS — R918 Other nonspecific abnormal finding of lung field: Secondary | ICD-10-CM | POA: Diagnosis not present

## 2019-09-30 DIAGNOSIS — F17210 Nicotine dependence, cigarettes, uncomplicated: Secondary | ICD-10-CM | POA: Diagnosis not present

## 2019-09-30 DIAGNOSIS — Z20822 Contact with and (suspected) exposure to covid-19: Secondary | ICD-10-CM | POA: Diagnosis not present

## 2019-09-30 DIAGNOSIS — J45909 Unspecified asthma, uncomplicated: Secondary | ICD-10-CM | POA: Diagnosis not present

## 2019-09-30 DIAGNOSIS — R05 Cough: Secondary | ICD-10-CM | POA: Diagnosis not present

## 2019-09-30 DIAGNOSIS — R0789 Other chest pain: Secondary | ICD-10-CM | POA: Diagnosis not present

## 2019-10-05 ENCOUNTER — Encounter: Payer: 59 | Admitting: Skilled Nursing Facility1

## 2019-10-05 DIAGNOSIS — L304 Erythema intertrigo: Secondary | ICD-10-CM | POA: Diagnosis not present

## 2019-10-05 DIAGNOSIS — M793 Panniculitis, unspecified: Secondary | ICD-10-CM | POA: Diagnosis not present

## 2019-10-05 DIAGNOSIS — M5404 Panniculitis affecting regions of neck and back, thoracic region: Secondary | ICD-10-CM | POA: Diagnosis not present

## 2019-10-05 NOTE — H&P (Signed)
  Subjective:     Patient ID: [name] is a 44 y.o. female.  HPI  Here for follow up discussion prior to planned abdominal panniculectomy and bilateral breast mastopexy. Highest weight 287 lb. Underwent gastric bypass 2018 with Dr. Andrey Campanile with lowest weight current stable at this for over 6 months. Reports rashes and skin excoriation beneath abdominal panniculus and beneath breasts weekly. Notes in summer months more frequent yeast infection vs drier months skin just breaks down/ulcerates. This occurs weekly and has not improved with hygiene measures and topical antifungals, placing towel beneath abdomen and breasts. Reports difficulty with dressing, sex, other physical activities due to redundant skin and breast ptosis.  MMG 08/2019 normal. Denies family history breast ca. Does not know current bra size, states using sports bra  Disabled secondary to history multiple back surgeries with neuropathy, reports history Staph infection that complicated one surgery. Lives with husband who works from home. Has adult son.  Review of Systems     Objective:   Physical Exam  Cardiovascular: Normal rate and regular rhythm.  Pulmonary/Chest: Effort normal and breath sounds normal.  Abdominal:  No hernias, moderate diastasis, panniculus present that extends beyond pubis symphysis, additional soft tissue roll/redundancy supraumbilical, erythema of fold beneath panniculus present   Lymphadenopathy:    She has no axillary adenopathy.  Skin:  Fitzpatrick 2   Breasts: No masses, grade 3 ptosis bilateral, minimal lateral chest wall soft tissue redundancy   SN to nipple R 30 L 28 cm BW R 17 L 17 cm Nipple to IMF R 8 L 9.5 Assessment:     Panniculitis abdomen Intertrigo chest/breasts S/p gastric bypass    Plan:     Chronic intertrigo, ulceration beneath breasts and abdominal panniculus that has failed conservative measures. Panniculectomy is likely to result in significant improvement of the  functional impairment and ulcerations. Over breasts, she has largely redundant skin without large volume breast tissue and would benefit from bilateral mastopexy.   Reviewed anchor type scars breasts, transverse scar abdomen. Plan overnight stay given length of surgery. Reviewed drains post procedure limitations. Reviewed panniculectomy is not same as abdominoplasty. Reviewed she will still have laxity of skin, will develop early pseudoptosis breasts due to loss elasticity skin post massive weight loss. Reviewed importance weight stability on duration result, changes with aging.   Diminished sensation nipple and breast skin, risk of nipple loss, wound healing problems, asymmetry, incidental carcinoma, unacceptable cosmetic appearance reviewed. Additional risks including but not limited to bleeding, seroma, hematoma, need for additional procedures, blood clots in legs or lungs, anesthesia, damage to adjacent structures reviewed.  Discussed risk COVID infectionthrough this elective surgery. Patient will receive COVID testing prior to surgery. Discussed even if patient receivesa negative test result, the tests in some cases may fail to detect the virus or patient maycontract COVID after the test.COVID 19 infectionbefore/during/aftersurgery may result in lead to a higher chance of complication and death.  Rx for Norco given

## 2019-10-06 ENCOUNTER — Other Ambulatory Visit: Payer: Self-pay

## 2019-10-06 ENCOUNTER — Encounter (HOSPITAL_BASED_OUTPATIENT_CLINIC_OR_DEPARTMENT_OTHER): Payer: Self-pay | Admitting: Plastic Surgery

## 2019-10-09 ENCOUNTER — Encounter (HOSPITAL_BASED_OUTPATIENT_CLINIC_OR_DEPARTMENT_OTHER)
Admission: RE | Admit: 2019-10-09 | Discharge: 2019-10-09 | Disposition: A | Discharge status: Home / Self Care | Payer: 59 | Source: Ambulatory Visit | Attending: Plastic Surgery | Admitting: Plastic Surgery

## 2019-10-09 ENCOUNTER — Ambulatory Visit: Payer: PPO

## 2019-10-09 ENCOUNTER — Other Ambulatory Visit (HOSPITAL_COMMUNITY)
Admission: RE | Admit: 2019-10-09 | Discharge: 2019-10-09 | Disposition: A | Discharge status: Home / Self Care | Payer: 59 | Source: Ambulatory Visit | Attending: Plastic Surgery | Admitting: Plastic Surgery

## 2019-10-09 ENCOUNTER — Other Ambulatory Visit: Payer: Self-pay

## 2019-10-09 DIAGNOSIS — Z20822 Contact with and (suspected) exposure to covid-19: Secondary | ICD-10-CM | POA: Insufficient documentation

## 2019-10-09 DIAGNOSIS — Z01812 Encounter for preprocedural laboratory examination: Secondary | ICD-10-CM | POA: Diagnosis not present

## 2019-10-09 LAB — BASIC METABOLIC PANEL
Anion gap: 11 (ref 5–15)
BUN: 11 mg/dL (ref 6–20)
CO2: 24 mmol/L (ref 22–32)
Calcium: 9.8 mg/dL (ref 8.9–10.3)
Chloride: 106 mmol/L (ref 98–111)
Creatinine, Ser: 0.59 mg/dL (ref 0.44–1.00)
GFR calc Af Amer: >60 mL/min (ref >60)
GFR calc non Af Amer: >60 mL/min (ref >60)
Glucose, Bld: 102 mg/dL — ABNORMAL HIGH (ref 70–99)
Potassium: 4.1 mmol/L (ref 3.5–5.1)
Sodium: 141 mmol/L (ref 135–145)

## 2019-10-09 LAB — CBC WITH DIFFERENTIAL/PLATELET
Abs Immature Granulocytes: 0.03 10*3/uL (ref 0.00–0.07)
Basophils Absolute: 0.1 10*3/uL (ref 0.0–0.1)
Basophils Relative: 1 %
Eosinophils Absolute: 0.2 10*3/uL (ref 0.0–0.5)
Eosinophils Relative: 2 %
HCT: 46.3 % — ABNORMAL HIGH (ref 36.0–46.0)
Hemoglobin: 15 g/dL (ref 12.0–15.0)
Immature Granulocytes: 0 %
Lymphocytes Relative: 29 %
Lymphs Abs: 3 10*3/uL (ref 0.7–4.0)
MCH: 31 pg (ref 26.0–34.0)
MCHC: 32.4 g/dL (ref 30.0–36.0)
MCV: 95.7 fL (ref 80.0–100.0)
Monocytes Absolute: 0.5 10*3/uL (ref 0.1–1.0)
Monocytes Relative: 5 %
Neutro Abs: 6.6 10*3/uL (ref 1.7–7.7)
Neutrophils Relative %: 63 %
Platelets: 425 10*3/uL — ABNORMAL HIGH (ref 150–400)
RBC: 4.84 MIL/uL (ref 3.87–5.11)
RDW: 12.5 % (ref 11.5–15.5)
WBC: 10.4 10*3/uL (ref 4.0–10.5)
nRBC: 0 % (ref 0.0–0.2)

## 2019-10-09 LAB — SARS CORONAVIRUS 2 (TAT 6-24 HRS): SARS Coronavirus 2: NEGATIVE

## 2019-10-09 NOTE — Progress Notes (Signed)
G2 given.      Enhanced Recovery after Surgery for Orthopedics Enhanced Recovery after Surgery is a protocol used to improve the stress on your body and your recovery after surgery.  Patient Instructions  . The night before surgery:  o No food after midnight. ONLY clear liquids after midnight  . The day of surgery (if you do NOT have diabetes):  o Drink ONE (1) Pre-Surgery Clear Ensure as directed.   o This drink was given to you during your hospital  pre-op appointment visit. o The pre-op nurse will instruct you on the time to drink the  Pre-Surgery Ensure depending on your surgery time. o Finish the drink at the designated time by the pre-op nurse.  o Nothing else to drink after completing the  Pre-Surgery Clear Ensure.  . The day of surgery (if you have diabetes): o Drink ONE (1) Gatorade 2 (G2) as directed. o This drink was given to you during your hospital  pre-op appointment visit.  o The pre-op nurse will instruct you on the time to drink the   Gatorade 2 (G2) depending on your surgery time. o Color of the Gatorade may vary. Red is not allowed. o Nothing else to drink after completing the  Gatorade 2 (G2).         If you have questions, please contact your surgeon's office.

## 2019-10-13 ENCOUNTER — Encounter (HOSPITAL_BASED_OUTPATIENT_CLINIC_OR_DEPARTMENT_OTHER): Payer: Self-pay | Admitting: Plastic Surgery

## 2019-10-13 ENCOUNTER — Other Ambulatory Visit: Payer: Self-pay

## 2019-10-13 ENCOUNTER — Ambulatory Visit (HOSPITAL_BASED_OUTPATIENT_CLINIC_OR_DEPARTMENT_OTHER): Payer: 59 | Admitting: Certified Registered Nurse Anesthetist

## 2019-10-13 ENCOUNTER — Encounter (HOSPITAL_BASED_OUTPATIENT_CLINIC_OR_DEPARTMENT_OTHER)
Admission: RE | Disposition: A | Discharge status: Home / Self Care | Payer: Self-pay | Source: Home / Self Care | Attending: Plastic Surgery

## 2019-10-13 ENCOUNTER — Ambulatory Visit (HOSPITAL_BASED_OUTPATIENT_CLINIC_OR_DEPARTMENT_OTHER)
Admission: RE | Admit: 2019-10-13 | Discharge: 2019-10-14 | Disposition: A | Discharge status: Home / Self Care | Payer: 59 | Attending: Plastic Surgery | Admitting: Plastic Surgery

## 2019-10-13 DIAGNOSIS — F17200 Nicotine dependence, unspecified, uncomplicated: Secondary | ICD-10-CM | POA: Insufficient documentation

## 2019-10-13 DIAGNOSIS — N6481 Ptosis of breast: Secondary | ICD-10-CM | POA: Diagnosis not present

## 2019-10-13 DIAGNOSIS — Z9884 Bariatric surgery status: Secondary | ICD-10-CM | POA: Diagnosis not present

## 2019-10-13 DIAGNOSIS — Z79899 Other long term (current) drug therapy: Secondary | ICD-10-CM | POA: Insufficient documentation

## 2019-10-13 DIAGNOSIS — E785 Hyperlipidemia, unspecified: Secondary | ICD-10-CM | POA: Diagnosis not present

## 2019-10-13 DIAGNOSIS — Z880 Allergy status to penicillin: Secondary | ICD-10-CM | POA: Insufficient documentation

## 2019-10-13 DIAGNOSIS — M793 Panniculitis, unspecified: Secondary | ICD-10-CM | POA: Diagnosis present

## 2019-10-13 DIAGNOSIS — Z885 Allergy status to narcotic agent status: Secondary | ICD-10-CM | POA: Diagnosis not present

## 2019-10-13 DIAGNOSIS — L304 Erythema intertrigo: Secondary | ICD-10-CM | POA: Insufficient documentation

## 2019-10-13 DIAGNOSIS — M5404 Panniculitis affecting regions of neck and back, thoracic region: Secondary | ICD-10-CM | POA: Diagnosis not present

## 2019-10-13 HISTORY — PX: MASTOPEXY: SHX5358

## 2019-10-13 HISTORY — PX: PANNICULECTOMY: SHX5360

## 2019-10-13 SURGERY — MASTOPEXY
Anesthesia: General | Site: Breast | Laterality: Bilateral

## 2019-10-13 MED ORDER — ONDANSETRON 4 MG PO TBDP
4.0000 mg | ORAL_TABLET | Freq: Four times a day (QID) | ORAL | Status: DC | PRN
  Administered 2019-10-14: 4 mg via ORAL
  Filled 2019-10-13: qty 1

## 2019-10-13 MED ORDER — FENTANYL CITRATE (PF) 100 MCG/2ML IJ SOLN
INTRAMUSCULAR | Status: AC
  Filled 2019-10-13: qty 2

## 2019-10-13 MED ORDER — FENTANYL CITRATE (PF) 100 MCG/2ML IJ SOLN
50.0000 ug | INTRAMUSCULAR | Status: AC | PRN
  Administered 2019-10-13: 100 ug via INTRAVENOUS
  Administered 2019-10-13 (×2): 50 ug via INTRAVENOUS
  Administered 2019-10-13: 100 ug via INTRAVENOUS

## 2019-10-13 MED ORDER — FENTANYL CITRATE (PF) 100 MCG/2ML IJ SOLN
25.0000 ug | INTRAMUSCULAR | Status: DC | PRN
  Administered 2019-10-13: 50 ug via INTRAVENOUS
  Administered 2019-10-13: 25 ug via INTRAVENOUS

## 2019-10-13 MED ORDER — BSS IO SOLN: 15.0000 mL | Freq: Once | INTRAOCULAR | Status: DC

## 2019-10-13 MED ORDER — BUPIVACAINE LIPOSOME 1.3 % IJ SUSP
INTRAMUSCULAR | Status: AC
  Filled 2019-10-13: qty 20

## 2019-10-13 MED ORDER — DEXAMETHASONE SODIUM PHOSPHATE 10 MG/ML IJ SOLN
INTRAMUSCULAR | Status: AC
  Filled 2019-10-13: qty 1

## 2019-10-13 MED ORDER — SODIUM CHLORIDE (PF) 0.9 % IJ SOLN
INTRAMUSCULAR | Status: AC
  Filled 2019-10-13: qty 20

## 2019-10-13 MED ORDER — HYDROMORPHONE HCL 1 MG/ML IJ SOLN
0.5000 mg | INTRAMUSCULAR | Status: DC | PRN
  Administered 2019-10-13 – 2019-10-14 (×3): 0.5 mg via INTRAVENOUS
  Filled 2019-10-13 (×3): qty 0.5

## 2019-10-13 MED ORDER — CHLORHEXIDINE GLUCONATE CLOTH 2 % EX PADS: 6.0000 | MEDICATED_PAD | Freq: Once | CUTANEOUS | Status: DC

## 2019-10-13 MED ORDER — ACETAMINOPHEN 500 MG PO TABS
1000.0000 mg | ORAL_TABLET | ORAL | Status: AC
  Administered 2019-10-13: 1000 mg via ORAL

## 2019-10-13 MED ORDER — GABAPENTIN 300 MG PO CAPS
ORAL_CAPSULE | ORAL | Status: AC
  Filled 2019-10-13: qty 1

## 2019-10-13 MED ORDER — MIDAZOLAM HCL 2 MG/2ML IJ SOLN
INTRAMUSCULAR | Status: AC
  Filled 2019-10-13: qty 2

## 2019-10-13 MED ORDER — SCOPOLAMINE 1 MG/3DAYS TD PT72
MEDICATED_PATCH | TRANSDERMAL | Status: AC
  Filled 2019-10-13: qty 1

## 2019-10-13 MED ORDER — PROCHLORPERAZINE MALEATE 10 MG PO TABS: 10.0000 mg | ORAL_TABLET | Freq: Four times a day (QID) | ORAL | Status: DC | PRN

## 2019-10-13 MED ORDER — DEXAMETHASONE SODIUM PHOSPHATE 4 MG/ML IJ SOLN
INTRAMUSCULAR | Status: DC | PRN
  Administered 2019-10-13: 8 mg via INTRAVENOUS

## 2019-10-13 MED ORDER — ONDANSETRON HCL 4 MG/2ML IJ SOLN
INTRAMUSCULAR | Status: AC
  Filled 2019-10-13: qty 2

## 2019-10-13 MED ORDER — DEXMEDETOMIDINE HCL 200 MCG/2ML IV SOLN
INTRAVENOUS | Status: DC | PRN
  Administered 2019-10-13: 20 ug via INTRAVENOUS

## 2019-10-13 MED ORDER — PANTOPRAZOLE SODIUM 40 MG PO TBEC: 40.0000 mg | DELAYED_RELEASE_TABLET | Freq: Two times a day (BID) | ORAL | Status: DC

## 2019-10-13 MED ORDER — ENOXAPARIN SODIUM 40 MG/0.4ML ~~LOC~~ SOLN
40.0000 mg | SUBCUTANEOUS | Status: DC
  Administered 2019-10-14: 40 mg via SUBCUTANEOUS
  Filled 2019-10-13: qty 0.4

## 2019-10-13 MED ORDER — ROCURONIUM BROMIDE 100 MG/10ML IV SOLN
INTRAVENOUS | Status: DC | PRN
  Administered 2019-10-13: 20 mg via INTRAVENOUS
  Administered 2019-10-13: 50 mg via INTRAVENOUS

## 2019-10-13 MED ORDER — PHENYLEPHRINE HCL (PRESSORS) 10 MG/ML IV SOLN
INTRAVENOUS | Status: AC
  Filled 2019-10-13: qty 1

## 2019-10-13 MED ORDER — KCL IN DEXTROSE-NACL 20-5-0.45 MEQ/L-%-% IV SOLN
INTRAVENOUS | Status: DC
  Filled 2019-10-13: qty 1000

## 2019-10-13 MED ORDER — ONDANSETRON HCL 4 MG/2ML IJ SOLN: 4.0000 mg | Freq: Once | INTRAMUSCULAR | Status: DC | PRN

## 2019-10-13 MED ORDER — KETOROLAC TROMETHAMINE 0.5 % OP SOLN
OPHTHALMIC | Status: AC
  Filled 2019-10-13: qty 5

## 2019-10-13 MED ORDER — LIDOCAINE 2% (20 MG/ML) 5 ML SYRINGE
INTRAMUSCULAR | Status: AC
  Filled 2019-10-13: qty 5

## 2019-10-13 MED ORDER — ROCURONIUM BROMIDE 10 MG/ML (PF) SYRINGE
PREFILLED_SYRINGE | INTRAVENOUS | Status: AC
  Filled 2019-10-13: qty 10

## 2019-10-13 MED ORDER — DIPHENHYDRAMINE HCL 50 MG/ML IJ SOLN
INTRAMUSCULAR | Status: AC
  Filled 2019-10-13: qty 1

## 2019-10-13 MED ORDER — ACETAMINOPHEN 500 MG PO TABS
ORAL_TABLET | ORAL | Status: AC
  Filled 2019-10-13: qty 2

## 2019-10-13 MED ORDER — LACTATED RINGERS IV SOLN: INTRAVENOUS | Status: DC

## 2019-10-13 MED ORDER — DEXMEDETOMIDINE BOLUS VIA INFUSION: 0.2630 ug/kg | Freq: Once | INTRAVENOUS | Status: DC

## 2019-10-13 MED ORDER — CLINDAMYCIN PHOSPHATE 900 MG/50ML IV SOLN
INTRAVENOUS | Status: AC
  Filled 2019-10-13: qty 50

## 2019-10-13 MED ORDER — PHENYLEPHRINE 40 MCG/ML (10ML) SYRINGE FOR IV PUSH (FOR BLOOD PRESSURE SUPPORT)
PREFILLED_SYRINGE | INTRAVENOUS | Status: AC
  Filled 2019-10-13: qty 10

## 2019-10-13 MED ORDER — ALBUTEROL SULFATE HFA 108 (90 BASE) MCG/ACT IN AERS: 2.0000 | INHALATION_SPRAY | Freq: Four times a day (QID) | RESPIRATORY_TRACT | Status: DC | PRN

## 2019-10-13 MED ORDER — DIPHENHYDRAMINE HCL 50 MG/ML IJ SOLN
INTRAMUSCULAR | Status: DC | PRN
  Administered 2019-10-13: 6.25 mg via INTRAVENOUS

## 2019-10-13 MED ORDER — HEPARIN SODIUM (PORCINE) 5000 UNIT/ML IJ SOLN
5000.0000 [IU] | Freq: Once | INTRAMUSCULAR | Status: AC
  Administered 2019-10-13: 5000 [IU] via SUBCUTANEOUS

## 2019-10-13 MED ORDER — HYDROCODONE-ACETAMINOPHEN 5-325 MG PO TABS
1.0000 | ORAL_TABLET | ORAL | Status: DC | PRN
  Administered 2019-10-13 – 2019-10-14 (×4): 2 via ORAL
  Filled 2019-10-13 (×4): qty 2

## 2019-10-13 MED ORDER — ONDANSETRON HCL 4 MG/2ML IJ SOLN: 4.0000 mg | Freq: Four times a day (QID) | INTRAMUSCULAR | Status: DC | PRN

## 2019-10-13 MED ORDER — GABAPENTIN 300 MG PO CAPS
300.0000 mg | ORAL_CAPSULE | ORAL | Status: AC
  Administered 2019-10-13: 300 mg via ORAL

## 2019-10-13 MED ORDER — CLINDAMYCIN PHOSPHATE 900 MG/50ML IV SOLN
900.0000 mg | INTRAVENOUS | Status: AC
  Administered 2019-10-13: 900 mg via INTRAVENOUS

## 2019-10-13 MED ORDER — EPHEDRINE SULFATE 50 MG/ML IJ SOLN
INTRAMUSCULAR | Status: DC | PRN
  Administered 2019-10-13 (×2): 10 mg via INTRAVENOUS

## 2019-10-13 MED ORDER — SUGAMMADEX SODIUM 200 MG/2ML IV SOLN
INTRAVENOUS | Status: DC | PRN
  Administered 2019-10-13: 150 mg via INTRAVENOUS

## 2019-10-13 MED ORDER — KETOROLAC TROMETHAMINE 0.5 % OP SOLN
1.0000 [drp] | Freq: Three times a day (TID) | OPHTHALMIC | Status: DC | PRN
  Administered 2019-10-13: 1 [drp] via OPHTHALMIC

## 2019-10-13 MED ORDER — SUCCINYLCHOLINE CHLORIDE 200 MG/10ML IV SOSY
PREFILLED_SYRINGE | INTRAVENOUS | Status: AC
  Filled 2019-10-13: qty 10

## 2019-10-13 MED ORDER — SCOPOLAMINE 1 MG/3DAYS TD PT72
1.0000 | MEDICATED_PATCH | Freq: Once | TRANSDERMAL | Status: DC
  Administered 2019-10-13: 1.5 mg via TRANSDERMAL

## 2019-10-13 MED ORDER — ONDANSETRON HCL 4 MG/2ML IJ SOLN
INTRAMUSCULAR | Status: DC | PRN
  Administered 2019-10-13 (×2): 4 mg via INTRAVENOUS

## 2019-10-13 MED ORDER — EPHEDRINE 5 MG/ML INJ
INTRAVENOUS | Status: AC
  Filled 2019-10-13: qty 10

## 2019-10-13 MED ORDER — SODIUM CHLORIDE 0.9 % IV SOLN
INTRAVENOUS | Status: DC | PRN
  Administered 2019-10-13: 40 mL

## 2019-10-13 MED ORDER — HEPARIN SODIUM (PORCINE) 5000 UNIT/ML IJ SOLN
INTRAMUSCULAR | Status: AC
  Filled 2019-10-13: qty 1

## 2019-10-13 MED ORDER — BSS IO SOLN
INTRAOCULAR | Status: AC
  Filled 2019-10-13: qty 15

## 2019-10-13 MED ORDER — PROPOFOL 10 MG/ML IV BOLUS
INTRAVENOUS | Status: DC | PRN
  Administered 2019-10-13: 25 ug/kg/min via INTRAVENOUS
  Administered 2019-10-13: 150 mg via INTRAVENOUS

## 2019-10-13 MED ORDER — MIDAZOLAM HCL 2 MG/2ML IJ SOLN
1.0000 mg | INTRAMUSCULAR | Status: DC | PRN
  Administered 2019-10-13: 2 mg via INTRAVENOUS

## 2019-10-13 MED ORDER — PROCHLORPERAZINE EDISYLATE 10 MG/2ML IJ SOLN: 5.0000 mg | Freq: Four times a day (QID) | INTRAMUSCULAR | Status: DC | PRN

## 2019-10-13 MED ORDER — SODIUM CHLORIDE 0.9 % IR SOLN
Status: DC | PRN
  Administered 2019-10-13: 500 mL

## 2019-10-13 SURGICAL SUPPLY — 81 items
ADH SKN CLS APL DERMABOND .7 (GAUZE/BANDAGES/DRESSINGS) ×8
APL PRP STRL LF DISP 70% ISPRP (MISCELLANEOUS) ×6
APPLIER CLIP 9.375 MED OPEN (MISCELLANEOUS) ×6
APR CLP MED 9.3 20 MLT OPN (MISCELLANEOUS) ×4
BINDER ABDOMINAL 10 UNV 27-48 (MISCELLANEOUS) ×1 IMPLANT
BINDER ABDOMINAL 12 SM 30-45 (SOFTGOODS) ×2 IMPLANT
BINDER BREAST LRG (GAUZE/BANDAGES/DRESSINGS) ×1 IMPLANT
BINDER BREAST MEDIUM (GAUZE/BANDAGES/DRESSINGS) IMPLANT
BINDER BREAST XXLRG (GAUZE/BANDAGES/DRESSINGS) IMPLANT
BLADE SURG 10 STRL SS (BLADE) ×7 IMPLANT
BLADE SURG 11 STRL SS (BLADE) ×1 IMPLANT
BLADE SURG 15 STRL LF DISP TIS (BLADE) IMPLANT
BLADE SURG 15 STRL SS (BLADE) ×3
BNDG GAUZE ELAST 4 BULKY (GAUZE/BANDAGES/DRESSINGS) ×6 IMPLANT
CANISTER SUCT 1200ML W/VALVE (MISCELLANEOUS) ×3 IMPLANT
CHLORAPREP W/TINT 26 (MISCELLANEOUS) ×5 IMPLANT
CLIP APPLIE 9.375 MED OPEN (MISCELLANEOUS) ×2 IMPLANT
COVER BACK TABLE 60X90IN (DRAPES) ×3 IMPLANT
COVER MAYO STAND STRL (DRAPES) ×3 IMPLANT
COVER WAND RF STERILE (DRAPES) IMPLANT
DERMABOND ADVANCED (GAUZE/BANDAGES/DRESSINGS) ×4
DERMABOND ADVANCED .7 DNX12 (GAUZE/BANDAGES/DRESSINGS) ×4 IMPLANT
DRAIN CHANNEL 15F RND FF W/TCR (WOUND CARE) ×8 IMPLANT
DRAPE TOP ARMCOVERS (MISCELLANEOUS) ×3 IMPLANT
DRAPE U-SHAPE 76X120 STRL (DRAPES) ×3 IMPLANT
DRAPE UTILITY XL STRL (DRAPES) ×4 IMPLANT
DRSG PAD ABDOMINAL 8X10 ST (GAUZE/BANDAGES/DRESSINGS) ×8 IMPLANT
ELECT BLADE 4.0 EZ CLEAN MEGAD (MISCELLANEOUS)
ELECT COATED BLADE 2.86 ST (ELECTRODE) ×3 IMPLANT
ELECT REM PT RETURN 9FT ADLT (ELECTROSURGICAL) ×3
ELECTRODE BLDE 4.0 EZ CLN MEGD (MISCELLANEOUS) IMPLANT
ELECTRODE REM PT RTRN 9FT ADLT (ELECTROSURGICAL) ×2 IMPLANT
EVACUATOR SILICONE 100CC (DRAIN) ×8 IMPLANT
GAUZE SPONGE 4X4 12PLY STRL (GAUZE/BANDAGES/DRESSINGS) IMPLANT
GAUZE SPONGE 4X4 12PLY STRL LF (GAUZE/BANDAGES/DRESSINGS) IMPLANT
GAUZE XEROFORM 1X8 LF (GAUZE/BANDAGES/DRESSINGS) ×1 IMPLANT
GLOVE BIOGEL PI IND STRL 7.0 (GLOVE) IMPLANT
GLOVE BIOGEL PI IND STRL 7.5 (GLOVE) IMPLANT
GLOVE BIOGEL PI INDICATOR 7.0 (GLOVE) ×2
GLOVE BIOGEL PI INDICATOR 7.5 (GLOVE) ×1
GLOVE SURG SS PI 6.0 STRL IVOR (GLOVE) ×2 IMPLANT
GLOVE SURG SS PI 6.5 STRL IVOR (GLOVE) ×1 IMPLANT
GLOVE SURG SS PI 7.5 STRL IVOR (GLOVE) ×1 IMPLANT
GOWN STRL REUS W/ TWL LRG LVL3 (GOWN DISPOSABLE) ×4 IMPLANT
GOWN STRL REUS W/ TWL XL LVL3 (GOWN DISPOSABLE) IMPLANT
GOWN STRL REUS W/TWL LRG LVL3 (GOWN DISPOSABLE) ×6
GOWN STRL REUS W/TWL XL LVL3 (GOWN DISPOSABLE) ×6
MARKER SKIN DUAL TIP RULER LAB (MISCELLANEOUS) ×1 IMPLANT
NDL HYPO 25X1 1.5 SAFETY (NEEDLE) IMPLANT
NEEDLE HYPO 25X1 1.5 SAFETY (NEEDLE) ×3 IMPLANT
NS IRRIG 1000ML POUR BTL (IV SOLUTION) ×3 IMPLANT
PACK BASIN DAY SURGERY FS (CUSTOM PROCEDURE TRAY) ×3 IMPLANT
PENCIL SMOKE EVACUATOR (MISCELLANEOUS) ×3 IMPLANT
PIN SAFETY STERILE (MISCELLANEOUS) ×3 IMPLANT
SHEET MEDIUM DRAPE 40X70 STRL (DRAPES) ×6 IMPLANT
SLEEVE SCD COMPRESS KNEE MED (MISCELLANEOUS) ×3 IMPLANT
SPONGE LAP 18X18 RF (DISPOSABLE) ×8 IMPLANT
STAPLER VISISTAT 35W (STAPLE) ×5 IMPLANT
SUT ETHILON 2 0 FS 18 (SUTURE) ×6 IMPLANT
SUT MNCRL AB 4-0 PS2 18 (SUTURE) ×10 IMPLANT
SUT PDS AB 0 CT 36 (SUTURE) ×4 IMPLANT
SUT PDS AB 2-0 CT2 27 (SUTURE) ×1 IMPLANT
SUT PLAIN 5 0 P 3 18 (SUTURE) ×1 IMPLANT
SUT VIC AB 2-0 CT1 27 (SUTURE)
SUT VIC AB 2-0 CT1 TAPERPNT 27 (SUTURE) IMPLANT
SUT VIC AB 3-0 PS1 18 (SUTURE) ×12
SUT VIC AB 3-0 PS1 18XBRD (SUTURE) ×2 IMPLANT
SUT VIC AB 3-0 SH 27 (SUTURE)
SUT VIC AB 3-0 SH 27X BRD (SUTURE) IMPLANT
SUT VICRYL 4-0 PS2 18IN ABS (SUTURE) ×5 IMPLANT
SUT VLOC 180 0 24IN GS25 (SUTURE) ×5 IMPLANT
SYR BULB IRRIGATION 50ML (SYRINGE) ×3 IMPLANT
SYR CONTROL 10ML LL (SYRINGE) IMPLANT
TAPE MEASURE VINYL STERILE (MISCELLANEOUS) ×3 IMPLANT
TOWEL GREEN STERILE FF (TOWEL DISPOSABLE) ×6 IMPLANT
TRAY FOL W/BAG SLVR 16FR STRL (SET/KITS/TRAYS/PACK) IMPLANT
TRAY FOLEY W/BAG SLVR 14FR LF (SET/KITS/TRAYS/PACK) ×1 IMPLANT
TRAY FOLEY W/BAG SLVR 16FR LF (SET/KITS/TRAYS/PACK)
TUBE CONNECTING 20X1/4 (TUBING) ×3 IMPLANT
UNDERPAD 30X36 HEAVY ABSORB (UNDERPADS AND DIAPERS) ×6 IMPLANT
YANKAUER SUCT BULB TIP NO VENT (SUCTIONS) ×3 IMPLANT

## 2019-10-13 NOTE — Interval H&P Note (Signed)
History and Physical Interval Note:  10/13/2019 6:46 AM  Herbert Seta Brunetta Genera  has presented today for surgery, with the diagnosis of panniculitis abdomen, panniculitis affecting thoracic region, intertrigo.  The various methods of treatment have been discussed with the patient and family. After consideration of risks, benefits and other options for treatment, the patient has consented to  Procedure(s): ABDOMINAL PANNICULECTOMY (N/A) BILATERAL MASTOPEXY (Bilateral) as a surgical intervention.  The patient's history has been reviewed, patient examined, no change in status, stable for surgery.  I have reviewed the patient's chart and labs.  Questions were answered to the patient's satisfaction.     Irean Hong Teoman Giraud

## 2019-10-13 NOTE — Anesthesia Preprocedure Evaluation (Addendum)
Anesthesia Evaluation  Patient identified by MRN, date of birth, ID band Patient awake    Reviewed: Allergy & Precautions, NPO status , Patient's Chart, lab work & pertinent test results  Airway Mallampati: II  TM Distance: >3 FB Neck ROM: Full    Dental  (+) Teeth Intact, Dental Advisory Given   Pulmonary Current Smoker,    breath sounds clear to auscultation       Cardiovascular  Rhythm:Regular Rate:Normal     Neuro/Psych    GI/Hepatic   Endo/Other    Renal/GU      Musculoskeletal   Abdominal   Peds  Hematology   Anesthesia Other Findings   Reproductive/Obstetrics                             Anesthesia Physical Anesthesia Plan  ASA: II  Anesthesia Plan: General   Post-op Pain Management:    Induction: Intravenous  PONV Risk Score and Plan: Ondansetron and Dexamethasone  Airway Management Planned: Oral ETT  Additional Equipment:   Intra-op Plan:   Post-operative Plan: Extubation in OR  Informed Consent: I have reviewed the patients History and Physical, chart, labs and discussed the procedure including the risks, benefits and alternatives for the proposed anesthesia with the patient or authorized representative who has indicated his/her understanding and acceptance.     Dental advisory given  Plan Discussed with: CRNA and Anesthesiologist  Anesthesia Plan Comments:         Anesthesia Quick Evaluation  

## 2019-10-13 NOTE — Transfer of Care (Signed)
Immediate Anesthesia Transfer of Care Note  Patient: [name]  Procedure(s) Performed: BILATERAL MASTOPEXY (Bilateral Breast) ABDOMINAL PANNICULECTOMY (Bilateral Abdomen)  Patient Location: PACU  Anesthesia Type:General  Level of Consciousness: drowsy  Airway & Oxygen Therapy: Patient Spontanous Breathing and Patient connected to face mask oxygen  Post-op Assessment: Report given to RN and Post -op Vital signs reviewed and stable  Post vital signs: Reviewed and stable  Last Vitals:  Vitals Value Taken Time  BP 109/65 10/13/19 1315  Temp    Pulse 87 10/13/19 1317  Resp 16 10/13/19 1317  SpO2 100 % 10/13/19 1317  Vitals shown include unvalidated device data.  Last Pain:  Vitals:   10/13/19 0640  TempSrc: Temporal  PainSc: 5       Patients Stated Pain Goal: 5 (10/13/19 0640)  Complications: No apparent anesthesia complications

## 2019-10-13 NOTE — Op Note (Signed)
Operative Note   DATE OF OPERATION: 3.16.21  LOCATION: [facility]-observation  SURGICAL DIVISION: Plastic Surgery  PREOPERATIVE DIAGNOSES:  1. Panniculitis abdomen 2. Intertrigo breasts 3. S/p gastric bypass  POSTOPERATIVE DIAGNOSES:  same  PROCEDURE:  1. Bilateral mastopexy 2. Abdominal panniculectomy  SURGEON: Irene Limbo MD MBA  ASSISTANT: none  ANESTHESIA:  General.   EBL: 90 m  COMPLICATIONS: None immediate.   INDICATIONS FOR PROCEDURE:  The patient, [name], is a 44 y.o. female born on May 18, 1976, is here for abdominal and breast contouring following massive weight loss and bariatric surgery.    FINDINGS: Right mastopexy 36 g Left mastopexy 46 g. Abdominal panniculectomy 789 g.  DESCRIPTION OF PROCEDURE:  The patient was marked standing in the preoperative area to mark sternal notch, chest midline, anterior axillary lines, inframammary folds. The location of new nipple areolar complex was marked at level of on inframammary fold on anterior surface breast by palpation. This was marked symmetric over bilateral breasts. With aid of Wise pattern marker, location of new nipple areolar complex and vertical limbs (7cm) were markedby displacement of breasts along meridian. Caudal incision over abdomen marked 7 cm from labial fourchette and extended over lateral abdomen.SQ heparin administered. The patient was taken to the operating room. SCDs were placedand IV antibiotics were given. Foley catheter placed. The patient's operative site was prepped and draped in a sterile fashion. A time out was performed and all information was confirmed to be correct.   Over left breast, superior pedicle marked and nipple areolar complexincised with 38 mm diameter. Pedicle deepithlialized and developedto chest wall. Inferiorly based dermoglandular pedicle preserved for use as auto augmentation. This was advanced superiorly and secured to chest wall with interrupted 2-0 PDS  suture. Medial and lateral flaps developed.Breast tailor tacked closed.  I then directed attention to right breast superior pedicle marked and nipple areolar complexincised with 38 mm diameter. Pedicle deepithlialized and developedto chest wall. Inferiorly based dermoglandular pedicle preserved for use as auto augmentation. This was advanced superiorly and secured to chest wall with interrupted 2-0 PDS suture. Medial and lateral flaps developed. Breast tailor tacked closed, and patient brought to upright sitting position and assessed for symmetry. Patent returned to supine position. Breast cavities irrigated and hemostasis obtained. Exparel infiltrated throughout each breast. 15 Fr JP placed in each breast and secured with 2-0 nylon. Closure completed bilateralwith 3-0 vicryl to approximate dermis along inframammary fold and vertical limb. NAC inset with 4-0 vicryl in dermis. Skin closure completed with 4-0 monocryl subcuticular throughout.Tissue adhesive applied.   Low transverse abdominal incision madecaudal toprior scar and carried through superficial fascia to abdominal wall. Skin flap elevated in sub Scarpa's layer, taking care to leave layer of subfascial fat over abdominal wall fascia. Dissection completed toward umbilicus. Umbilicus sharply incised and scissor dissection completed to free from abdominal skin flap. Additional dissection completed in midline toward xiphoid. Wound irrigated and hemostasis obtained. Exparel infiltrated.15 Fr JP placed in subcutaneous right and left abdomen and secured with 2-0 nylon. Diastasis recti imbricated with 0 V lock suture over both supra and infraumbilical abdomen. Patient then brought to semi sitting position.Caudal extent skin excision marked by palpation.Area marked excised.Superiorly based U shaped skin flap incised for delivery umbilicus.Low transverse abdominal skin incision closed with 0 PDS in superficial fascia. 0 V lock used to closedermis  and 4-0 monocryl for subcuticular skin closure. Umbilicus inset with 4-0 monocryl in dermis and 5-0 plain gut for skin closure. Xeroform bolster placed within umbilicus. Dermabond applied.  Dry dressing, breast binder, and abdominal binder applied.The patient was allowed to wake from anesthesia, extubated and taken to the recovery room in satisfactory condition.   SPECIMENS: right and left breast tissue  DRAINS: 15 Fr JP in right and left breast, right and left subcutaneous abdomen

## 2019-10-13 NOTE — Anesthesia Procedure Notes (Signed)
Procedure Name: Intubation Date/Time: 10/13/2019 7:37 AM Performed by: Willa Frater, CRNA Pre-anesthesia Checklist: Patient identified, Emergency Drugs available, Suction available and Patient being monitored Patient Re-evaluated:Patient Re-evaluated prior to induction Oxygen Delivery Method: Circle system utilized Preoxygenation: Pre-oxygenation with 100% oxygen Induction Type: IV induction Ventilation: Mask ventilation without difficulty Laryngoscope Size: Mac and 3 Grade View: Grade I Tube type: Oral Tube size: 7.0 mm Number of attempts: 1 Airway Equipment and Method: Stylet and Oral airway Placement Confirmation: ETT inserted through vocal cords under direct vision,  positive ETCO2 and breath sounds checked- equal and bilateral Secured at: 21 cm Tube secured with: Tape Dental Injury: Teeth and Oropharynx as per pre-operative assessment

## 2019-10-13 NOTE — Anesthesia Postprocedure Evaluation (Signed)
Anesthesia Post Note  Patient: [name]  Procedure(s) Performed: BILATERAL MASTOPEXY (Bilateral Breast) ABDOMINAL PANNICULECTOMY (Bilateral Abdomen)     Patient location during evaluation: PACU Anesthesia Type: General Level of consciousness: awake and alert Pain management: pain level controlled Vital Signs Assessment: post-procedure vital signs reviewed and stable Respiratory status: spontaneous breathing, nonlabored ventilation, respiratory function stable and patient connected to nasal cannula oxygen Cardiovascular status: blood pressure returned to baseline and stable Postop Assessment: no apparent nausea or vomiting Anesthetic complications: no    Last Vitals:  Vitals:   10/13/19 1430 10/13/19 1500  BP: (!) 93/55 (!) 113/58  Pulse: 76 77  Resp: 15 16  Temp:  36.4 C  SpO2: 96% 95%    Last Pain:  Vitals:   10/13/19 1500  TempSrc:   PainSc: 6                  Trevor Iha

## 2019-10-14 DIAGNOSIS — M793 Panniculitis, unspecified: Secondary | ICD-10-CM | POA: Diagnosis not present

## 2019-10-14 LAB — SURGICAL PATHOLOGY

## 2019-10-14 MED ORDER — DIPHENHYDRAMINE HCL 25 MG PO CAPS
25.0000 mg | ORAL_CAPSULE | Freq: Four times a day (QID) | ORAL | Status: DC | PRN
  Administered 2019-10-14: 25 mg via ORAL
  Filled 2019-10-14: qty 1

## 2019-10-14 MED ORDER — DIPHENHYDRAMINE HCL 25 MG PO CAPS: 25.0000 mg | ORAL_CAPSULE | Freq: Four times a day (QID) | ORAL | Status: DC | PRN

## 2019-10-14 NOTE — Discharge Summary (Signed)
Physician Discharge Summary  Patient ID: [name] MRN: [id number] DOB/AGE: 46-Aug-1977 46 y.o.  Admit date: 10/13/2019 Discharge date: 10/14/2019  Admission Diagnoses: Panniculitis  Discharge Diagnoses:  Active Problems:   Panniculitis  Discharged Condition: stable  Hospital Course: Post operatively patient ambulated with minimal assist and tolerated diet. She experienced itching with pain medication which has been long standing problem with multiple pain medications. Benadryl utilized to address this. Instructed on drain care, bathing, and positioning.  Treatments: surgery: bilateral mastopexies panniculectomy 3.16.21  Discharge Exam: Blood pressure (!) 92/54, pulse 62, temperature 99.2 F (37.3 C), resp. rate 18, height 5\' 7"  (1.702 m), weight 60.8 kg, SpO2 92 %. Incision/Wound: incisions intact dry NAC viable abdomen and breast soft drains serosanguinous  Disposition: Discharge disposition: 01-Home or Self Care       Discharge Instructions    Call MD for:  redness, tenderness, or signs of infection (pain, swelling, bleeding, redness, odor or green/yellow discharge around incision site)   Complete by: As directed    Call MD for:  temperature >100.5   Complete by: As directed    Discharge instructions   Complete by: As directed    Ok to remove dressings and shower am 3.18.21. Soap and water ok, pat incisions dry. No creams or ointments over incisions. Do not let drains dangle in shower, attach to lanyard or similar.Strip and record drains twice daily and bring log to clinic visit.  Breast binder or soft compression bra, abdominal binder or compression garment all other times.  Ok to raise arms above shoulders for bathing and dressing.  No house yard work or exercise until cleared by MD.   Sleep and recline with head elevated on 2-3 pillows and pillow beneath knees. Walk bent at hip through follow up visit.  Patient received pain medication Rx prior to surgery.    Driving Restrictions   Complete by: As directed    No driving if taking narcotics   Lifting restrictions   Complete by: As directed    No lifting > 5 lbs until cleared by MD   Resume previous diet   Complete by: As directed      Allergies as of 10/14/2019      Reactions   Penicillins Anaphylaxis, Other (See Comments)   Has patient had a PCN reaction causing immediate rash, facial/tongue/throat swelling, SOB or lightheadedness with hypotension: Yes Has patient had a PCN reaction causing severe rash involving mucus membranes or skin necrosis: Yes Has patient had a PCN reaction that required hospitalization: Yes Has patient had a PCN reaction occurring within the last 10 years: No If all of the above answers are "NO", then may proceed with Cephalosporin use.   Oxycodone Itching, Nausea Only, Other (See Comments)   "bugs crawling on me". Can take if takes Benadryl first    Prednisone Other (See Comments)   Insomnia,  Unable to sleep    Quinolones Other (See Comments)   BLURRY VISION   Latex Rash, Other (See Comments)   Latex adhesives tape only per pt.Not allergic to latex gloves or other products, USE PAPER TAPE ONLY PLEASE      Medication List    TAKE these medications   albuterol 108 (90 Base) MCG/ACT inhaler Commonly known as: VENTOLIN HFA Inhale 2 puffs into the lungs every 6 (six) hours as needed for wheezing or shortness of breath.   CALCIUM/VITAMIN D3 GUMMIES PO Take 1 each by mouth 3 (three) times daily.   clarithromycin 500 MG tablet Commonly  known as: BIAXIN Take 500 mg by mouth 2 (two) times daily.   MULTIVITAMIN PO Take 1 tablet by mouth 2 (two) times daily.   ondansetron 4 MG disintegrating tablet Commonly known as: ZOFRAN-ODT Take 4 mg by mouth 3 (three) times daily as needed for nausea or vomiting.   pantoprazole 40 MG tablet Commonly known as: PROTONIX Take 1 tablet (40 mg total) by mouth 2 (two) times daily.   promethazine 50 MG tablet Commonly  known as: PHENERGAN Take 1 tablet (50 mg total) by mouth every 6 (six) hours as needed for nausea or vomiting.      Follow-up Information    Glenna Fellows, MD In 1 week.   Specialty: Plastic Surgery Why: as scheduled Contact information: [address] [place] [address] [phone]           Signed: Glenna Fellows 10/14/2019, 7:29 AM

## 2019-10-14 NOTE — Discharge Instructions (Signed)
Post Anesthesia Home Care Instructions  Activity: Get plenty of rest for the remainder of the day. A responsible individual must stay with you for 24 hours following the procedure.  For the next 24 hours, DO NOT: -Drive a car -Operate machinery -Drink alcoholic beverages -Take any medication unless instructed by your physician -Make any legal decisions or sign important papers.  Meals: Start with liquid foods such as gelatin or soup. Progress to regular foods as tolerated. Avoid greasy, spicy, heavy foods. If nausea and/or vomiting occur, drink only clear liquids until the nausea and/or vomiting subsides. Call your physician if vomiting continues.  Special Instructions/Symptoms: Your throat may feel dry or sore from the anesthesia or the breathing tube placed in your throat during surgery. If this causes discomfort, gargle with warm salt water. The discomfort should disappear within 24 hours.  If you had a scopolamine patch placed behind your ear for the management of post- operative nausea and/or vomiting:  1. The medication in the patch is effective for 72 hours, after which it should be removed.  Wrap patch in a tissue and discard in the trash. Wash hands thoroughly with soap and water. 2. You may remove the patch earlier than 72 hours if you experience unpleasant side effects which may include dry mouth, dizziness or visual disturbances. 3. Avoid touching the patch. Wash your hands with soap and water after contact with the patch.     About my Jackson-Pratt Bulb Drain  What is a Jackson-Pratt bulb? A Jackson-Pratt is a soft, round device used to collect drainage. It is connected to a long, thin drainage catheter, which is held in place by one or two small stiches near your surgical incision site. When the bulb is squeezed, it forms a vacuum, forcing the drainage to empty into the bulb.  Emptying the Jackson-Pratt bulb- To empty the bulb: 1. Release the plug on the top of the  bulb. 2. Pour the bulb's contents into a measuring container which your nurse will provide. 3. Record the time emptied and amount of drainage. Empty the drain(s) as often as your     doctor or nurse recommends.  Date                  Time                    Amount (Drain 1)                 Amount (Drain 2)  _____________________________________________________________________  _____________________________________________________________________  _____________________________________________________________________  _____________________________________________________________________  _____________________________________________________________________  _____________________________________________________________________  _____________________________________________________________________  _____________________________________________________________________  Squeezing the Jackson-Pratt Bulb- To squeeze the bulb: 1. Make sure the plug at the top of the bulb is open. 2. Squeeze the bulb tightly in your fist. You will hear air squeezing from the bulb. 3. Replace the plug while the bulb is squeezed. 4. Use a safety pin to attach the bulb to your clothing. This will keep the catheter from     pulling at the bulb insertion site.  When to call your doctor- Call your doctor if:  Drain site becomes red, swollen or hot.  You have a fever greater than 101 degrees F.  There is oozing at the drain site.  Drain falls out (apply a guaze bandage over the drain hole and secure it with tape).  Drainage increases daily not related to activity patterns. (You will usually have more drainage when you are active than when you are resting.)  Drainage has   a bad odor.    Information for Discharge Teaching: EXPAREL (bupivacaine liposome injectable suspension)   Your surgeon or anesthesiologist gave you EXPAREL(bupivacaine) to help control your pain after surgery.   EXPAREL is a local  anesthetic that provides pain relief by numbing the tissue around the surgical site.  EXPAREL is designed to release pain medication over time and can control pain for up to 72 hours.  Depending on how you respond to EXPAREL, you may require less pain medication during your recovery.  Possible side effects:  Temporary loss of sensation or ability to move in the area where bupivacaine was injected.  Nausea, vomiting, constipation  Rarely, numbness and tingling in your mouth or lips, lightheadedness, or anxiety may occur.  Call your doctor right away if you think you may be experiencing any of these sensations, or if you have other questions regarding possible side effects.  Follow all other discharge instructions given to you by your surgeon or nurse. Eat a healthy diet and drink plenty of water or other fluids.  If you return to the hospital for any reason within 96 hours following the administration of EXPAREL, it is important for health care providers to know that you have received this anesthetic. A teal colored band has been placed on your arm with the date, time and amount of EXPAREL you have received in order to alert and inform your health care providers. Please leave this armband in place for the full 96 hours following administration, and then you may remove the band. 

## 2019-10-15 ENCOUNTER — Encounter: Payer: Self-pay | Admitting: *Deleted

## 2019-10-27 DIAGNOSIS — M7661 Achilles tendinitis, right leg: Secondary | ICD-10-CM | POA: Diagnosis not present

## 2019-12-23 DIAGNOSIS — R05 Cough: Secondary | ICD-10-CM | POA: Diagnosis not present

## 2019-12-23 DIAGNOSIS — R0981 Nasal congestion: Secondary | ICD-10-CM | POA: Diagnosis not present

## 2020-01-06 NOTE — H&P (Signed)
Subjective:    Patient ID: [name] is a 44 y.o. female.  HPI  Nearly 3 months post op abdominal panniculectomy. Patient missed last post op visit. She is scheduled for bilateral thigh lipectomy this month.  Highest weight 287 lb. Underwent gastric bypass 2018 with Dr. Andrey Campanile with lowest weight current stable at this for over 6 months.  States no "gap" between legs and reports recurrent rashes between medial thighs. Notes in summer months more frequent yeast infection vs drier months skin just breaks down/ulcerates. This has not improved with hygiene measures and topical antifungals.  MMG 08/2019 normal. Denies family history breast ca. Did not know bra size prior to surgery, states using sports bra. Right mastopexy 36 g Left mastopexy 46 g. Abdominal panniculectomy 789 g.  Disabled secondary to history multiple back surgeries with neuropathy, reports history Staph infection that complicated one surgery. Lives with husband who works from home. Has adult son.  Review of Systems     Objective:  Physical Exam  Cardiovascular: Normal rate, regular rhythm and normal heart sounds.  Pulmonary/Chest: Effort normal and breath sounds normal.   Breasts: scars maturing volume symmetric SN to nipple R 20 L 20 cm Nipple to IMF R 8 L 8 cm Abd: soft scars maturing some redundancy of supraumbilical skin, the soft tissue is not taut/ is inelastic over lateral abdomen and flanks but not redundant skin Tan- sunburn present over abdomen with peeling  Thighs: redundant soft tissue over proximal 1/2 thigh no active rashses  Assessment:    Panniculitis abdomen Intertrigo chest/breasts S/p gastric bypass S/p bilateral mastopexy, abdominal panniculectomy Intertrigo thighs    Plan:    Pictures today.   Reviewed supraumbilical redundant skin is in part due to loss elasticity soft tissue given her massive weight gain and loss. Can revise this with additional soft tissue resection central  abdomen- this would include incision within scar central abdomen and around umbilicus again, would likely have straight line scar in midline from umbilicus. Alternative is reverse abdominoplasty incision- this would utilize IMF scars breast but likely need extension across midline chest. Counseled this would likely not be covered by insurance. Reports her goal to have flat abdomen so she can wear different clothing.  Notes again this visit area from umbilicus to incision is numb. Reviewed this is normal, can improve over next several months but some degree may be permanent. Revision in this area would make area numb again and start process scar maturation over. Reviewed importance sun protection as scars maturing- can lead to permanent pigmentation scars.  Reviewedincisions bilateral medial thighs from groin to medial knee. Discussed liposuction of anticipated area resection to help debulk and aid with skin closure.Reviewedovernight stay, drains. Reviewedthat this is more difficult surgery from which to recover vs abdominal lipectomy or mastopexy, quoted 100 % chance wound healing problems, post operative restrictions would include leg elevation, compression socks/hose or ACE wraps.She has knee high compression at home.   Additional risks including but not limited to seroma, bleeding, hematoma, lymphocele, asymmetry, damage to adjacent structures, need for additional procedures, unacceptable cosmetic appearance, blood clots in legs or lungs reviewed.  Completed ASPS consent for medial thigh lift.  Discussed risk COVID infectionthrough this elective surgery. Patient will receive COVID testing prior to surgery. Discussed even if patient receivesa negative test result, the tests in some cases may fail to detect the virus or patient maycontract COVID after the test.COVID 19 infectionbefore/during/aftersurgery may result in lead to a higher chance of complication and death.  Rx for Norco  given.

## 2020-01-12 ENCOUNTER — Inpatient Hospital Stay (HOSPITAL_COMMUNITY): Admission: RE | Admit: 2020-01-12 | Payer: PPO | Source: Ambulatory Visit

## 2020-01-12 ENCOUNTER — Inpatient Hospital Stay: Admission: RE | Admit: 2020-01-12 | Payer: PPO | Source: Ambulatory Visit

## 2020-02-10 NOTE — Pre-Procedure Instructions (Signed)
[facility] - [place], [place] - [address] [place] [address] Phone: [phone] Fax: [phone]      Your procedure is scheduled on Monday July 19th.  Report to [facility] Main Entrance "A" at 5:30 A.M., and check in at the Admitting office.  Call this number if you have problems the morning of surgery:  [phone]  Call [phone] if you have any questions prior to your surgery date Monday-Friday 8am-4pm    Remember:  Do not eat after midnight the night before your surgery  You may drink clear liquids until 4:15 the morning of your surgery.   Clear liquids allowed are: Water, Non-Citrus Juices (without pulp), Carbonated Beverages, Clear Tea, Black Coffee Only, and Gatorade    Take these medicines the morning of surgery with A SIP OF WATER   pantoprazole (PROTONIX) 40 MG tablet    Take these medications if needed:  albuterol (PROVENTIL HFA;VENTOLIN HFA) 108 (90 BASE) MCG/ACT inhaler for wheezing or shortness of breath  ondansetron (ZOFRAN-ODT) 4 MG disintegrating  promethazine (PHENERGAN) 50 MG tablet  As of today, STOP taking any Aspirin (unless otherwise instructed by your surgeon) Aleve, Naproxen, Ibuprofen, Motrin, Advil, Goody's, BC's, all herbal medications, fish oil, and all vitamins.                      Do not wear jewelry, make up, or nail polish            Do not wear lotions, powders, perfumes, or deodorant.            Do not shave 48 hours prior to surgery.              Do not bring valuables to the hospital.            [facility] is not responsible for any belongings or valuables.  Do NOT Smoke (Tobacco/Vaping) or drink Alcohol 24 hours prior to your procedure If you use a CPAP at night, you may bring all equipment for your overnight stay.   Contacts, glasses, dentures or bridgework may not be worn into surgery.      For patients admitted to the hospital, discharge time will be determined by your treatment team.   Patients  discharged the day of surgery will not be allowed to drive home, and someone needs to stay with them for 24 hours.    Special instructions:   [facility]- Preparing For Surgery  Before surgery, you can play an important role. Because skin is not sterile, your skin needs to be as free of germs as possible. You can reduce the number of germs on your skin by washing with CHG (chlorahexidine gluconate) Soap before surgery.  CHG is an antiseptic cleaner which kills germs and bonds with the skin to continue killing germs even after washing.    Oral Hygiene is also important to reduce your risk of infection.  Remember - BRUSH YOUR TEETH THE MORNING OF SURGERY WITH YOUR REGULAR TOOTHPASTE  Please do not use if you have an allergy to CHG or antibacterial soaps. If your skin becomes reddened/irritated stop using the CHG.  Do not shave (including legs and underarms) for at least 48 hours prior to first CHG shower. It is OK to shave your face.  Please follow these instructions carefully.   1. Shower the NIGHT BEFORE SURGERY and the MORNING OF SURGERY with CHG Soap.   2. If you chose to wash your hair,  wash your hair first as usual with your normal shampoo.  3. After you shampoo, rinse your hair and body thoroughly to remove the shampoo.  4. Use CHG as you would any other liquid soap. You can apply CHG directly to the skin and wash gently with a scrungie or a clean washcloth.   5. Apply the CHG Soap to your body ONLY FROM THE NECK DOWN.  Do not use on open wounds or open sores. Avoid contact with your eyes, ears, mouth and genitals (private parts). Wash Face and genitals (private parts)  with your normal soap.   6. Wash thoroughly, paying special attention to the area where your surgery will be performed.  7. Thoroughly rinse your body with warm water from the neck down.  8. DO NOT shower/wash with your normal soap after using and rinsing off the CHG Soap.  9. Pat yourself dry with a CLEAN  TOWEL.  10. Wear CLEAN PAJAMAS to bed the night before surgery  11. Place CLEAN SHEETS on your bed the night of your first shower and DO NOT SLEEP WITH PETS.   Day of Surgery: Wear Clean/Comfortable clothing the morning of surgery Do not apply any deodorants/lotions.   Remember to brush your teeth WITH YOUR REGULAR TOOTHPASTE.   Please read over the following fact sheets that you were given.

## 2020-02-11 ENCOUNTER — Encounter (HOSPITAL_COMMUNITY)
Admission: RE | Admit: 2020-02-11 | Discharge: 2020-02-11 | Disposition: A | Discharge status: Home / Self Care | Payer: 59 | Source: Ambulatory Visit | Attending: Plastic Surgery | Admitting: Plastic Surgery

## 2020-02-11 ENCOUNTER — Other Ambulatory Visit
Admission: RE | Admit: 2020-02-11 | Discharge: 2020-02-11 | Disposition: A | Discharge status: Home / Self Care | Payer: 59 | Source: Ambulatory Visit | Attending: Plastic Surgery | Admitting: Plastic Surgery

## 2020-02-11 ENCOUNTER — Other Ambulatory Visit: Payer: Self-pay

## 2020-02-11 DIAGNOSIS — Z01812 Encounter for preprocedural laboratory examination: Secondary | ICD-10-CM | POA: Diagnosis not present

## 2020-02-11 DIAGNOSIS — Z20822 Contact with and (suspected) exposure to covid-19: Secondary | ICD-10-CM | POA: Diagnosis not present

## 2020-02-11 LAB — CBC
HCT: 43.1 % (ref 36.0–46.0)
Hemoglobin: 13.9 g/dL (ref 12.0–15.0)
MCH: 30.3 pg (ref 26.0–34.0)
MCHC: 32.3 g/dL (ref 30.0–36.0)
MCV: 93.9 fL (ref 80.0–100.0)
Platelets: 310 10*3/uL (ref 150–400)
RBC: 4.59 MIL/uL (ref 3.87–5.11)
RDW: 13.4 % (ref 11.5–15.5)
WBC: 12.4 10*3/uL — ABNORMAL HIGH (ref 4.0–10.5)
nRBC: 0 % (ref 0.0–0.2)

## 2020-02-11 LAB — BASIC METABOLIC PANEL WITH GFR
Anion gap: 9 (ref 5–15)
BUN: 9 mg/dL (ref 6–20)
CO2: 28 mmol/L (ref 22–32)
Calcium: 9.7 mg/dL (ref 8.9–10.3)
Chloride: 103 mmol/L (ref 98–111)
Creatinine, Ser: 0.55 mg/dL (ref 0.44–1.00)
GFR calc Af Amer: >60 mL/min
GFR calc non Af Amer: >60 mL/min
Glucose, Bld: 100 mg/dL — ABNORMAL HIGH (ref 70–99)
Potassium: 4.2 mmol/L (ref 3.5–5.1)
Sodium: 140 mmol/L (ref 135–145)

## 2020-02-11 LAB — SARS CORONAVIRUS 2 (TAT 6-24 HRS): SARS Coronavirus 2: NEGATIVE

## 2020-02-11 LAB — GLUCOSE, CAPILLARY: Glucose-Capillary: 108 mg/dL — ABNORMAL HIGH (ref 70–99)

## 2020-02-11 MED ORDER — CHLORHEXIDINE GLUCONATE CLOTH 2 % EX PADS: 6.0000 | MEDICATED_PAD | Freq: Once | CUTANEOUS | Status: DC

## 2020-02-11 NOTE — Progress Notes (Signed)
PCP - FRANCS MULLIGAN PA IN [facility] Cardiologist - NA  -  -   Chest x-ray - 2018 EKG -2018  Stress Test - NA ECHO - NA Cardiac NA  Cath - NA       Fasting Blood Sugar - 108 Checks Blood Sugar ___0__ times a day   Aspirin Instructions:STOP  ERAS Protcol -YES    INSTRUCTIONS GIVEN   COVID TEST- FOR TODAY   Anesthesia review: NA  Patient denies shortness of breath, fever, cough and chest pain at PAT appointment   All instructions explained to the patient, with a verbal understanding of the material. Patient agrees to go over the instructions while at home for a better understanding. Patient also instructed to self quarantine after being tested for COVID-19. The opportunity to ask questions was provided.

## 2020-02-15 ENCOUNTER — Observation Stay (HOSPITAL_COMMUNITY)
Admission: RE | Admit: 2020-02-15 | Discharge: 2020-02-16 | Disposition: A | Discharge status: Home / Self Care | Payer: 59 | Attending: Plastic Surgery | Admitting: Plastic Surgery

## 2020-02-15 ENCOUNTER — Encounter (HOSPITAL_COMMUNITY)
Admission: RE | Disposition: A | Discharge status: Home / Self Care | Payer: Self-pay | Source: Home / Self Care | Attending: Plastic Surgery

## 2020-02-15 ENCOUNTER — Other Ambulatory Visit: Payer: Self-pay

## 2020-02-15 ENCOUNTER — Ambulatory Visit (HOSPITAL_COMMUNITY): Payer: 59 | Admitting: Registered Nurse

## 2020-02-15 ENCOUNTER — Encounter (HOSPITAL_COMMUNITY): Payer: Self-pay | Admitting: Plastic Surgery

## 2020-02-15 DIAGNOSIS — M793 Panniculitis, unspecified: Secondary | ICD-10-CM | POA: Diagnosis not present

## 2020-02-15 DIAGNOSIS — L304 Erythema intertrigo: Principal | ICD-10-CM | POA: Insufficient documentation

## 2020-02-15 DIAGNOSIS — Z79899 Other long term (current) drug therapy: Secondary | ICD-10-CM | POA: Insufficient documentation

## 2020-02-15 DIAGNOSIS — Z9884 Bariatric surgery status: Secondary | ICD-10-CM | POA: Insufficient documentation

## 2020-02-15 HISTORY — PX: LIPECTOMY: SHX11

## 2020-02-15 SURGERY — LIPECTOMY
Anesthesia: General | Site: Thigh | Laterality: Bilateral

## 2020-02-15 MED ORDER — HEPARIN SODIUM (PORCINE) 5000 UNIT/ML IJ SOLN
5000.0000 [IU] | Freq: Once | INTRAMUSCULAR | Status: AC
  Administered 2020-02-15: 5000 [IU] via SUBCUTANEOUS
  Filled 2020-02-15: qty 1

## 2020-02-15 MED ORDER — PHENYLEPHRINE 40 MCG/ML (10ML) SYRINGE FOR IV PUSH (FOR BLOOD PRESSURE SUPPORT)
PREFILLED_SYRINGE | INTRAVENOUS | Status: AC
  Filled 2020-02-15: qty 10

## 2020-02-15 MED ORDER — FENTANYL CITRATE (PF) 100 MCG/2ML IJ SOLN
INTRAMUSCULAR | Status: AC
  Filled 2020-02-15: qty 2

## 2020-02-15 MED ORDER — DIPHENHYDRAMINE HCL 50 MG/ML IJ SOLN
INTRAMUSCULAR | Status: AC
  Filled 2020-02-15: qty 1

## 2020-02-15 MED ORDER — FENTANYL CITRATE (PF) 100 MCG/2ML IJ SOLN
25.0000 ug | INTRAMUSCULAR | Status: DC | PRN
  Administered 2020-02-15 (×2): 25 ug via INTRAVENOUS
  Administered 2020-02-15 (×2): 50 ug via INTRAVENOUS

## 2020-02-15 MED ORDER — PROMETHAZINE HCL 25 MG PO TABS: 50.0000 mg | ORAL_TABLET | Freq: Four times a day (QID) | ORAL | Status: DC | PRN

## 2020-02-15 MED ORDER — SODIUM CHLORIDE (PF) 0.9 % IJ SOLN
INTRAMUSCULAR | Status: DC | PRN
Start: 2020-02-15 — End: 2020-02-15
  Administered 2020-02-15: 20 mL via INTRAVENOUS

## 2020-02-15 MED ORDER — BUPIVACAINE LIPOSOME 1.3 % IJ SUSP
20.0000 mL | Freq: Once | INTRAMUSCULAR | Status: DC
  Filled 2020-02-15: qty 20

## 2020-02-15 MED ORDER — ONDANSETRON HCL 4 MG/2ML IJ SOLN
4.0000 mg | Freq: Four times a day (QID) | INTRAMUSCULAR | Status: DC | PRN
  Administered 2020-02-15 – 2020-02-16 (×3): 4 mg via INTRAVENOUS
  Filled 2020-02-15 (×3): qty 2

## 2020-02-15 MED ORDER — PROPOFOL 10 MG/ML IV BOLUS
INTRAVENOUS | Status: DC | PRN
  Administered 2020-02-15: 150 mg via INTRAVENOUS

## 2020-02-15 MED ORDER — DIPHENHYDRAMINE HCL 50 MG/ML IJ SOLN
INTRAMUSCULAR | Status: DC | PRN
  Administered 2020-02-15: 12.5 mg via INTRAVENOUS

## 2020-02-15 MED ORDER — MIDAZOLAM HCL 5 MG/5ML IJ SOLN
INTRAMUSCULAR | Status: DC | PRN
  Administered 2020-02-15: 2 mg via INTRAVENOUS

## 2020-02-15 MED ORDER — LIDOCAINE 2% (20 MG/ML) 5 ML SYRINGE
INTRAMUSCULAR | Status: DC | PRN
  Administered 2020-02-15: 100 mg via INTRAVENOUS

## 2020-02-15 MED ORDER — 0.9 % SODIUM CHLORIDE (POUR BTL) OPTIME
TOPICAL | Status: DC | PRN
Start: 2020-02-15 — End: 2020-02-15
  Administered 2020-02-15: 1000 mL

## 2020-02-15 MED ORDER — PANTOPRAZOLE SODIUM 40 MG PO TBEC
40.0000 mg | DELAYED_RELEASE_TABLET | Freq: Two times a day (BID) | ORAL | Status: DC
  Administered 2020-02-15 (×2): 40 mg via ORAL
  Filled 2020-02-15 (×2): qty 1

## 2020-02-15 MED ORDER — ENOXAPARIN SODIUM 40 MG/0.4ML ~~LOC~~ SOLN
40.0000 mg | SUBCUTANEOUS | Status: DC
  Administered 2020-02-16: 40 mg via SUBCUTANEOUS
  Filled 2020-02-15: qty 0.4

## 2020-02-15 MED ORDER — ONDANSETRON HCL 4 MG/2ML IJ SOLN
INTRAMUSCULAR | Status: AC
  Filled 2020-02-15: qty 2

## 2020-02-15 MED ORDER — ONDANSETRON 4 MG PO TBDP: 4.0000 mg | ORAL_TABLET | Freq: Four times a day (QID) | ORAL | Status: DC | PRN

## 2020-02-15 MED ORDER — SODIUM BICARBONATE 4 % IV SOLN
INTRAVENOUS | Status: DC | PRN
  Administered 2020-02-15: 250 mL via INTRAMUSCULAR

## 2020-02-15 MED ORDER — FENTANYL CITRATE (PF) 100 MCG/2ML IJ SOLN
INTRAMUSCULAR | Status: DC | PRN
  Administered 2020-02-15: 25 ug via INTRAVENOUS
  Administered 2020-02-15: 50 ug via INTRAVENOUS
  Administered 2020-02-15 (×2): 25 ug via INTRAVENOUS

## 2020-02-15 MED ORDER — HYDROMORPHONE HCL 1 MG/ML IJ SOLN
0.5000 mg | INTRAMUSCULAR | Status: DC | PRN
  Administered 2020-02-15 – 2020-02-16 (×4): 0.5 mg via INTRAVENOUS
  Filled 2020-02-15 (×4): qty 1

## 2020-02-15 MED ORDER — FENTANYL CITRATE (PF) 250 MCG/5ML IJ SOLN
INTRAMUSCULAR | Status: AC
  Filled 2020-02-15: qty 5

## 2020-02-15 MED ORDER — ALBUTEROL SULFATE (2.5 MG/3ML) 0.083% IN NEBU
2.5000 mg | INHALATION_SOLUTION | Freq: Four times a day (QID) | RESPIRATORY_TRACT | Status: DC | PRN
  Filled 2020-02-15: qty 3

## 2020-02-15 MED ORDER — PROPOFOL 10 MG/ML IV BOLUS
INTRAVENOUS | Status: AC
  Filled 2020-02-15: qty 20

## 2020-02-15 MED ORDER — KCL IN DEXTROSE-NACL 20-5-0.45 MEQ/L-%-% IV SOLN
INTRAVENOUS | Status: DC
  Filled 2020-02-15: qty 1000

## 2020-02-15 MED ORDER — KETOROLAC TROMETHAMINE 30 MG/ML IJ SOLN: 30.0000 mg | Freq: Once | INTRAMUSCULAR | Status: DC | PRN

## 2020-02-15 MED ORDER — DEXAMETHASONE SODIUM PHOSPHATE 10 MG/ML IJ SOLN
INTRAMUSCULAR | Status: AC
  Filled 2020-02-15: qty 1

## 2020-02-15 MED ORDER — ONDANSETRON HCL 4 MG/2ML IJ SOLN
INTRAMUSCULAR | Status: DC | PRN
  Administered 2020-02-15: 4 mg via INTRAVENOUS

## 2020-02-15 MED ORDER — MIDAZOLAM HCL 2 MG/2ML IJ SOLN
INTRAMUSCULAR | Status: AC
  Filled 2020-02-15: qty 2

## 2020-02-15 MED ORDER — PHENYLEPHRINE HCL-NACL 10-0.9 MG/250ML-% IV SOLN
INTRAVENOUS | Status: DC | PRN
Start: 2020-02-15 — End: 2020-02-15
  Administered 2020-02-15: 30 ug/min via INTRAVENOUS

## 2020-02-15 MED ORDER — LACTATED RINGERS IV SOLN: INTRAVENOUS | Status: DC

## 2020-02-15 MED ORDER — BUPIVACAINE LIPOSOME 1.3 % IJ SUSP
INTRAMUSCULAR | Status: DC | PRN
Start: 2020-02-15 — End: 2020-02-15
  Administered 2020-02-15: 20 mL

## 2020-02-15 MED ORDER — HYDROCODONE-ACETAMINOPHEN 5-325 MG PO TABS
ORAL_TABLET | ORAL | Status: AC
  Filled 2020-02-15: qty 2

## 2020-02-15 MED ORDER — SODIUM BICARBONATE 4 % IV SOLN
Freq: Once | INTRAVENOUS | Status: DC
  Filled 2020-02-15: qty 50

## 2020-02-15 MED ORDER — CLINDAMYCIN PHOSPHATE 900 MG/50ML IV SOLN
900.0000 mg | INTRAVENOUS | Status: AC
  Administered 2020-02-15: 900 mg via INTRAVENOUS
  Filled 2020-02-15: qty 50

## 2020-02-15 MED ORDER — HYDROCODONE-ACETAMINOPHEN 5-325 MG PO TABS
1.0000 | ORAL_TABLET | ORAL | Status: DC | PRN
  Administered 2020-02-15 – 2020-02-16 (×4): 2 via ORAL
  Filled 2020-02-15 (×3): qty 2

## 2020-02-15 MED ORDER — ACETAMINOPHEN 500 MG PO TABS
1000.0000 mg | ORAL_TABLET | ORAL | Status: AC
  Administered 2020-02-15: 1000 mg via ORAL
  Filled 2020-02-15: qty 2

## 2020-02-15 MED ORDER — EPHEDRINE 5 MG/ML INJ
INTRAVENOUS | Status: AC
  Filled 2020-02-15: qty 10

## 2020-02-15 MED ORDER — PROMETHAZINE HCL 25 MG PO TABS
25.0000 mg | ORAL_TABLET | Freq: Four times a day (QID) | ORAL | Status: DC | PRN
  Administered 2020-02-15: 25 mg via ORAL
  Filled 2020-02-15: qty 1

## 2020-02-15 MED ORDER — LIDOCAINE 2% (20 MG/ML) 5 ML SYRINGE
INTRAMUSCULAR | Status: AC
  Filled 2020-02-15: qty 5

## 2020-02-15 MED ORDER — CHLORHEXIDINE GLUCONATE 0.12 % MT SOLN
15.0000 mL | Freq: Once | OROMUCOSAL | Status: AC
  Administered 2020-02-15: 15 mL via OROMUCOSAL
  Filled 2020-02-15: qty 15

## 2020-02-15 MED ORDER — PHENYLEPHRINE 40 MCG/ML (10ML) SYRINGE FOR IV PUSH (FOR BLOOD PRESSURE SUPPORT)
PREFILLED_SYRINGE | INTRAVENOUS | Status: DC | PRN
  Administered 2020-02-15 (×3): 80 ug via INTRAVENOUS

## 2020-02-15 MED ORDER — DEXAMETHASONE SODIUM PHOSPHATE 4 MG/ML IJ SOLN
INTRAMUSCULAR | Status: DC | PRN
  Administered 2020-02-15: 5 mg via INTRAVENOUS

## 2020-02-15 MED ORDER — ORAL CARE MOUTH RINSE: 15.0000 mL | Freq: Once | OROMUCOSAL | Status: AC

## 2020-02-15 MED ORDER — EPHEDRINE SULFATE-NACL 50-0.9 MG/10ML-% IV SOSY
PREFILLED_SYRINGE | INTRAVENOUS | Status: DC | PRN
  Administered 2020-02-15: 10 mg via INTRAVENOUS

## 2020-02-15 SURGICAL SUPPLY — 50 items
ADH SKN CLS APL DERMABOND .7 (GAUZE/BANDAGES/DRESSINGS) ×5
APL PRP STRL LF DISP 70% ISPRP (MISCELLANEOUS)
APPLIER CLIP 9.375 MED OPEN (MISCELLANEOUS) ×2
APR CLP MED 9.3 20 MLT OPN (MISCELLANEOUS) ×1
BNDG CMPR MED 10X6 ELC LF (GAUZE/BANDAGES/DRESSINGS)
BNDG ELASTIC 4X5.8 VLCR STR LF (GAUZE/BANDAGES/DRESSINGS) ×2 IMPLANT
BNDG ELASTIC 6X10 VLCR STRL LF (GAUZE/BANDAGES/DRESSINGS) ×2 IMPLANT
BNDG GAUZE ELAST 4 BULKY (GAUZE/BANDAGES/DRESSINGS) ×2 IMPLANT
CANISTER SUCT 3000ML PPV (MISCELLANEOUS) ×2 IMPLANT
CHLORAPREP W/TINT 26 (MISCELLANEOUS) ×3 IMPLANT
CLIP APPLIE 9.375 MED OPEN (MISCELLANEOUS) ×1 IMPLANT
DERMABOND ADVANCED (GAUZE/BANDAGES/DRESSINGS) ×5
DERMABOND ADVANCED .7 DNX12 (GAUZE/BANDAGES/DRESSINGS) ×2 IMPLANT
DRAIN CHANNEL 19F RND (DRAIN) ×4 IMPLANT
DRAPE HALF SHEET 40X57 (DRAPES) ×5 IMPLANT
DRAPE IMP U-DRAPE 54X76 (DRAPES) ×3 IMPLANT
DRAPE ORTHO SPLIT 87X125 STRL (DRAPES) ×4 IMPLANT
DRAPE SURG 17X23 STRL (DRAPES) ×4 IMPLANT
DRAPE UTILITY W/TAPE 26X15 (DRAPES) ×4 IMPLANT
DRSG TELFA 3X8 NADH (GAUZE/BANDAGES/DRESSINGS) ×4 IMPLANT
DURAPREP 26ML APPLICATOR (WOUND CARE) ×4 IMPLANT
ELECT COATED BLADE 2.86 ST (ELECTRODE) ×2 IMPLANT
ELECT REM PT RETURN 9FT ADLT (ELECTROSURGICAL) ×2
ELECTRODE REM PT RTRN 9FT ADLT (ELECTROSURGICAL) ×1 IMPLANT
EVACUATOR SILICONE 100CC (DRAIN) ×4 IMPLANT
FILTER IN LINE W/DETACHED HOSE (FILTER) ×2 IMPLANT
GAUZE 4X4 16PLY RFD (DISPOSABLE) IMPLANT
GLOVE BIO SURGEON STRL SZ 6 (GLOVE) ×4 IMPLANT
GLOVE INDICATOR 7.5 STRL GRN (GLOVE) ×5 IMPLANT
GOWN STRL REUS W/ TWL LRG LVL3 (GOWN DISPOSABLE) ×1 IMPLANT
GOWN STRL REUS W/TWL LRG LVL3 (GOWN DISPOSABLE) ×2
NDL HYPO 25GX1X1/2 BEV (NEEDLE) ×1 IMPLANT
NEEDLE HYPO 25GX1X1/2 BEV (NEEDLE) ×2 IMPLANT
PACK GENERAL/GYN (CUSTOM PROCEDURE TRAY) ×2 IMPLANT
PAD ABD 8X10 STRL (GAUZE/BANDAGES/DRESSINGS) IMPLANT
PAD DRESSING TELFA 3X8 NADH (GAUZE/BANDAGES/DRESSINGS) IMPLANT
PIN SAFETY STERILE (MISCELLANEOUS) ×2 IMPLANT
SOLUTION BETADINE 4OZ (MISCELLANEOUS) ×2 IMPLANT
SPONGE LAP 18X18 X RAY DECT (DISPOSABLE) IMPLANT
STAPLER VISISTAT 35W (STAPLE) ×3 IMPLANT
SUT ETHILON 2 0 FS 18 (SUTURE) ×4 IMPLANT
SUT MNCRL 0 MO-4 VIOLET 18 CR (SUTURE) ×2 IMPLANT
SUT MONOCRYL 0 MO 4 18  CR/8 (SUTURE) ×8
SUT PDS AB 2-0 CT2 27 (SUTURE) ×9 IMPLANT
SUT VLOC 180 0 24IN GS25 (SUTURE) ×4 IMPLANT
SYR CONTROL 10ML LL (SYRINGE) ×2 IMPLANT
TOWEL GREEN STERILE (TOWEL DISPOSABLE) ×2 IMPLANT
TRAY FOL W/BAG SLVR 16FR STRL (SET/KITS/TRAYS/PACK) IMPLANT
TRAY FOLEY MTR SLVR 16FR STAT (SET/KITS/TRAYS/PACK) ×1 IMPLANT
TRAY FOLEY W/BAG SLVR 16FR LF (SET/KITS/TRAYS/PACK) ×2

## 2020-02-15 NOTE — Transfer of Care (Signed)
Immediate Anesthesia Transfer of Care Note  Patient: [name]  Procedure(s) Performed: BILATERAL THIGH LIPECTOMY (Bilateral Thigh)  Patient Location: PACU  Anesthesia Type:General  Level of Consciousness: awake, alert  and oriented  Airway & Oxygen Therapy: Patient Spontanous Breathing and Patient connected to nasal cannula oxygen  Post-op Assessment: Report given to RN and Post -op Vital signs reviewed and stable  Post vital signs: Reviewed and stable  Last Vitals:  Vitals Value Taken Time  BP 131/81 02/15/20 0951  Temp 36.6 C 02/15/20 0951  Pulse 72 02/15/20 0954  Resp 8 02/15/20 0954  SpO2 100 % 02/15/20 0954  Vitals shown include unvalidated device data.  Last Pain:  Vitals:   02/15/20 0623  TempSrc:   PainSc: 0-No pain         Complications: No complications documented.

## 2020-02-15 NOTE — H&P (Signed)
Subjective:    Patient ID: [name] is a 44 y.o. female.  HPI  4 months post op abdominal panniculectomy. Patient presents for bilateral thigh lipectomy.  Highest weight 287 lb. Underwent gastric bypass 2018 with Dr. Andrey Campanile with lowest weight current stable at this for over 6 months.  States no "gap" between legs and reports recurrent rashes between medial thighs. Notes in summer months more frequent yeast infection vs drier months skin just breaks down/ulcerates. This has not improved with hygiene measures and topical antifungals.  MMG 08/2019 normal. Denies family history breast ca. Did not know bra size prior to surgery, states using sports bra. Right mastopexy 36 g Left mastopexy 46 g. Abdominal panniculectomy 789 g.  Disabled secondary to history multiple back surgeries with neuropathy, reports history Staph infection that complicated one surgery. Lives with husband who works from home. Has adult son.  Review of Systems     Objective:  Physical Exam  Cardiovascular: Normal rate, regular rhythm and normal heart sounds.  Pulmonary/Chest: Effort normal and breath sounds normal.   Breasts: scars maturing volume symmetric SN to nipple R 20 L 20 cm Nipple to IMF R 8 L 8 cm Abd: soft scars maturing some redundancy of supraumbilical skin, the soft tissue is not taut/ is inelastic over lateral abdomen and flanks but not redundant skin Tan- sunburn present over abdomen with peeling  Thighs: redundant soft tissue over proximal 1/2 thigh no active rashses  Assessment:    Panniculitis abdomen Intertrigo chest/breasts S/p gastric bypass S/p bilateral mastopexy, abdominal panniculectomy Intertrigo thighs    Plan:    Reviewedincisions bilateral medial thighs from groin to medial knee. Discussed liposuction of anticipated area resection to help debulk and aid with skin closure.Reviewedovernight stay, drains. Reviewedthat this is more difficult surgery from which to  recover vs abdominal lipectomy or mastopexy, quoted 100 % chance wound healing problems, post operative restrictions would include leg elevation, compression socks/hose or ACE wraps.She has knee high compression at home.   Additional risks including but not limited to seroma, bleeding, hematoma, lymphocele, asymmetry, damage to adjacent structures, need for additional procedures, unacceptable cosmetic appearance, blood clots in legs or lungs reviewed.  Completed ASPS consent for medial thigh lift.  Discussed risk COVID infectionthrough this elective surgery. Patient will receive COVID testing prior to surgery. Discussed even if patient receivesa negative test result, the tests in some cases may fail to detect the virus or patient maycontract COVID after the test.COVID 19 infectionbefore/during/aftersurgery may result in lead to a higher chance of complication and death.  Rx for Norco given in 02/03/2020.

## 2020-02-15 NOTE — Discharge Instructions (Signed)
General Anesthesia, Adult, Care After This sheet gives you information about how to care for yourself after your procedure. Your health care provider may also give you more specific instructions. If you have problems or questions, contact your health care provider. What can I expect after the procedure? After the procedure, the following side effects are common:  Pain or discomfort at the IV site.  Nausea.  Vomiting.  Sore throat.  Trouble concentrating.  Feeling cold or chills.  Weak or tired.  Sleepiness and fatigue.  Soreness and body aches. These side effects can affect parts of the body that were not involved in surgery. Follow these instructions at home:  For at least 24 hours after the procedure:  Have a responsible adult stay with you. It is important to have someone help care for you until you are awake and alert.  Rest as needed.  Do not: ? Participate in activities in which you could fall or become injured. ? Drive. ? Use heavy machinery. ? Drink alcohol. ? Take sleeping pills or medicines that cause drowsiness. ? Make important decisions or sign legal documents. ? Take care of children on your own. Eating and drinking  Follow any instructions from your health care provider about eating or drinking restrictions.  When you feel hungry, start by eating small amounts of foods that are soft and easy to digest (bland), such as toast. Gradually return to your regular diet.  Drink enough fluid to keep your urine pale yellow.  If you vomit, rehydrate by drinking water, juice, or clear broth. General instructions  If you have sleep apnea, surgery and certain medicines can increase your risk for breathing problems. Follow instructions from your health care provider about wearing your sleep device: ? Anytime you are sleeping, including during daytime naps. ? While taking prescription pain medicines, sleeping medicines, or medicines that make you drowsy.  Return to  your normal activities as told by your health care provider. Ask your health care provider what activities are safe for you.  Take over-the-counter and prescription medicines only as told by your health care provider.  If you smoke, do not smoke without supervision.  Keep all follow-up visits as told by your health care provider. This is important. Contact a health care provider if:  You have nausea or vomiting that does not get better with medicine.  You cannot eat or drink without vomiting.  You have pain that does not get better with medicine.  You are unable to pass urine.  You develop a skin rash.  You have a fever.  You have redness around your IV site that gets worse. Get help right away if:  You have difficulty breathing.  You have chest pain.  You have blood in your urine or stool, or you vomit blood. Summary  After the procedure, it is common to have a sore throat or nausea. It is also common to feel tired.  Have a responsible adult stay with you for the first 24 hours after general anesthesia. It is important to have someone help care for you until you are awake and alert.  When you feel hungry, start by eating small amounts of foods that are soft and easy to digest (bland), such as toast. Gradually return to your regular diet.  Drink enough fluid to keep your urine pale yellow.  Return to your normal activities as told by your health care provider. Ask your health care provider what activities are safe for you. This information is not   intended to replace advice given to you by your health care provider. Make sure you discuss any questions you have with your health care provider. Document Revised: 07/19/2017 Document Reviewed: 03/01/2017 Elsevier Patient Education  2020 [employer]  

## 2020-02-15 NOTE — Anesthesia Preprocedure Evaluation (Signed)
Anesthesia Evaluation  °Patient identified by MRN, date of birth, ID band °Patient awake ° ° ° °Reviewed: °Allergy & Precautions, NPO status , Patient's Chart, lab work & pertinent test results ° °Airway °Mallampati: II ° °TM Distance: >3 FB °Neck ROM: Full ° ° ° Dental °no notable dental hx. ° °  °Pulmonary °neg pulmonary ROS, Current Smoker and Patient abstained from smoking.,  °  °Pulmonary exam normal °breath sounds clear to auscultation ° ° ° ° ° ° Cardiovascular °negative cardio ROS °Normal cardiovascular exam °Rhythm:Regular Rate:Normal ° ° °  °Neuro/Psych °negative neurological ROS ° negative psych ROS  ° GI/Hepatic °Neg liver ROS, GERD  ,  °Endo/Other  °negative endocrine ROS ° Renal/GU °negative Renal ROS  °negative genitourinary °  °Musculoskeletal °negative musculoskeletal ROS °(+)  ° Abdominal °  °Peds °negative pediatric ROS °(+)  Hematology °negative hematology ROS °(+)   °Anesthesia Other Findings ° ° Reproductive/Obstetrics °negative OB ROS ° °  ° ° ° ° ° ° ° ° ° ° ° ° ° °  °  ° ° ° ° ° ° ° ° °Anesthesia Physical °Anesthesia Plan ° °ASA: II ° °Anesthesia Plan: General  ° °Post-op Pain Management:   ° °Induction: Intravenous ° °PONV Risk Score and Plan: 2 and Ondansetron, Dexamethasone and Treatment may vary due to age or medical condition ° °Airway Management Planned: LMA ° °Additional Equipment:  ° °Intra-op Plan:  ° °Post-operative Plan: Extubation in OR ° °Informed Consent: I have reviewed the patients History and Physical, chart, labs and discussed the procedure including the risks, benefits and alternatives for the proposed anesthesia with the patient or authorized representative who has indicated his/her understanding and acceptance.  ° ° ° °Dental advisory given ° °Plan Discussed with: CRNA and Surgeon ° °Anesthesia Plan Comments:   ° ° ° ° ° ° °Anesthesia Quick Evaluation ° °

## 2020-02-15 NOTE — Anesthesia Procedure Notes (Signed)
Procedure Name: LMA Insertion Date/Time: 02/15/2020 7:25 AM Performed by: Laruth Bouchard., CRNA Pre-anesthesia Checklist: Patient identified, Emergency Drugs available, Suction available, Patient being monitored and Timeout performed Patient Re-evaluated:Patient Re-evaluated prior to induction Oxygen Delivery Method: Circle system utilized Preoxygenation: Pre-oxygenation with 100% oxygen Induction Type: IV induction Ventilation: Mask ventilation without difficulty LMA: LMA inserted LMA Size: 4.0 Number of attempts: 1 Placement Confirmation: positive ETCO2 and breath sounds checked- equal and bilateral Tube secured with: Tape Dental Injury: Teeth and Oropharynx as per pre-operative assessment

## 2020-02-15 NOTE — Progress Notes (Signed)
   02/15/20 1220  Assess: MEWS Score  Temp 97.6 F (36.4 C)  BP 99/67  Pulse Rate 60  Resp 12  Level of Consciousness Alert  SpO2 99 %  O2 Device Room Air  Assess: MEWS Score  MEWS Temp 0  MEWS Systolic 1  MEWS Pulse 0  MEWS RR 1  MEWS LOC 0  MEWS Score 2  MEWS Score Color Yellow  Assess: if the MEWS score is Yellow or Red  Were vital signs taken at a resting state? Yes  Focused Assessment No change from prior assessment  Early Detection of Sepsis Score *See Row Information* Low  MEWS guidelines implemented *See Row Information* Yes  Treat  MEWS Interventions Escalated (See documentation below)  Pain Scale 0-10  Pain Score 0  Take Vital Signs  Increase Vital Sign Frequency  Yellow: Q 2hr X 2 then Q 4hr X 2, if remains yellow, continue Q 4hrs  Escalate  MEWS: Escalate Yellow: discuss with charge nurse/RN and consider discussing with provider and RRT  Notify: Charge Nurse/RN  Name of Charge Nurse/RN Notified Merrilyn Puma  Date Charge Nurse/RN Notified 02/15/20  Time Charge Nurse/RN Notified 1228

## 2020-02-15 NOTE — Progress Notes (Signed)
Provider aware since patient was at PACU

## 2020-02-15 NOTE — Anesthesia Postprocedure Evaluation (Signed)
Anesthesia Post Note  Patient: [name]  Procedure(s) Performed: BILATERAL THIGH LIPECTOMY (Bilateral Thigh)     Patient location during evaluation: PACU Anesthesia Type: General Level of consciousness: awake and alert Pain management: pain level controlled Vital Signs Assessment: post-procedure vital signs reviewed and stable Respiratory status: spontaneous breathing, nonlabored ventilation, respiratory function stable and patient connected to nasal cannula oxygen Cardiovascular status: blood pressure returned to baseline and stable Postop Assessment: no apparent nausea or vomiting Anesthetic complications: no   No complications documented.  Last Vitals:  Vitals:   02/15/20 0951 02/15/20 1000  BP: 131/81 121/81  Pulse: 82 77  Resp: 15 14  Temp: 36.6 C   SpO2: 100% 95%    Last Pain:  Vitals:   02/15/20 0951  TempSrc:   PainSc: Asleep                 Shron Ozer S

## 2020-02-15 NOTE — Op Note (Signed)
Operative Note   DATE OF OPERATION: 7.19.21  LOCATION: [facility].  SURGICAL DIVISION: Plastic Surgery  PREOPERATIVE DIAGNOSES:  1. Intertrigo thighs 2. History gastric bypass  POSTOPERATIVE DIAGNOSES:  same  PROCEDURE:  Bilateral thigh lipectomy  SURGEON: Glenna Fellows MD MBA  ASSISTANT: Elsie Ra RNFA  ANESTHESIA:  General.   EBL: 25 ml  COMPLICATIONS: None immediate.   INDICATIONS FOR PROCEDURE:  The patient, [name], is a 44 y.o. female born on 07/04/1976, is here for bilateral thigh lipectomy for treatment chronic intertrigo that has not been controlled with conservative measures.   FINDINGS: 150 ml lipoaspirate from each thigh. Right thigh soft tissue resection 204 g Left thigh soft tissue resection 129 g  DESCRIPTION OF PROCEDURE:  The patient's operative site was marked with the patient in the preoperative areain standing position to mark excision with desired scar at midline thighs extending tojust cephalad tomedial knee bilateral. Bilateral areas of soft tissue fullness at medial knee marked for liposuction. The soft tissue was displaced posteriorly and anteriorly and anticipated area of resection marked against thigh midline.The patientwas taken to the operating room. Foley catheter placed, SCDs placed. SQ heparin administered,and IV antibiotics were given. The patient's operative site was prepped and draped in a sterile fashion. A time out was performed and all information was confirmed to be correct.  Stab incision made in area of planned resection bilateral thigh and tumescent infiltrated, approximately 150 ml tumescent infiltrated in each thigh in area of planned resection and medial knee soft tissue. Suction assisted liposuction performedin marked areas to aid with dissection and closure. Total lipoaspirate approximately 300 ml.  Incision made overleftanteromedial thigh and carried through superficial fascia. Dissection proceeded from  anterior to posterior thigh taking care to remain just beneath superficial fascia. The area marked for resection was marked again by palpation through elevated skin flaps and resection completed. Wound irrigated and hemostasis ensured. 19 Fr JP placed percutaneously and secured with 2-0 nylon. Exparel infiltrated.Closure completed with interrrupted 2-0 PDS suture in superficial fascia. 0 V lock suture used to approximate dermis and 4-0 monocryl subcuticular suture used for skin closure.  I then directed attention torightthigh. Incision made over anteromedial thigh and carried through superficial fascia. Dissection proceeded from anterior to posterior thigh taking care to remain just beneath superficial fascia. The area marked for resection was checked again by palpation through elevated skin flaps and resection completed. Wound irrigated and hemostasis ensured. 19 Fr JP placed percutaneously and secured with 2-0 nylon. Exparel infiltrated. Closure completed with interrrupted 2-0 PDS suture in superficial fascia. 0 V lock suture used to approximate dermis and 4-0 monocryl subcuticular suture used for skin closure. Dermabond applied to all incisions.  TED hose applied. Dry dressing and Ace wraps applied to bilateral thighs.  The patient was allowed to wake from anesthesia, extubated and taken to the recovery room in satisfactory condition.   SPECIMENS: none  DRAINS: 19 Fr JP in right and left subcutaneous thigh

## 2020-02-15 NOTE — Progress Notes (Addendum)
Prior to giving chg mouthwash, pt. Was asked if she had any reaction to  the chg soap when showering x2 with it last night and this morning. Pt. Stated no. After mouthwash given, pt. Noticed her abdomen and legs had a red rash on the skin. No itching. Offered to get wet washcloth to wipe of skin,but pt. Refused. Dr. Charolette Forward made aware.

## 2020-02-16 DIAGNOSIS — L304 Erythema intertrigo: Secondary | ICD-10-CM | POA: Diagnosis not present

## 2020-02-16 NOTE — Progress Notes (Signed)
Patient discharged to home with instructions and some supplies given.

## 2020-02-16 NOTE — Plan of Care (Signed)
  Problem: Education: Goal: Knowledge of General Education information will improve Description: Including pain rating scale, medication(s)/side effects and non-pharmacologic comfort measures Outcome: Progressing   Problem: Activity: Goal: Risk for activity intolerance will decrease Outcome: Progressing   Problem: Nutrition: Goal: Adequate nutrition will be maintained Outcome: Progressing   

## 2020-02-16 NOTE — Discharge Summary (Signed)
hysician Discharge Summary  Patient ID: [name] MRN: [id number] DOB/AGE: [birth year] 44 y.o.  Admit date: 02/15/2020 Discharge date: 02/16/2020  Admission Diagnoses: Panniculitis  Discharge Diagnoses:  Active Problems:   Panniculitis   Discharged Condition: stable  Hospital Course: Post operatively patient did well tolerating diet, ambulating without assist and able to void post Foley discharge on POD#0. Instructed on compression elevation, activity, and drain care.  reatments: surgery: bilateral thigh lipectomy   Discharge Exam: Blood pressure (!) 91/59, pulse 62, temperature 98.4 F (36.9 C), temperature source Oral, resp. rate 16, height 5\' 7"  (1.702 m), weight 56.4 kg, SpO2 95 %. Incision/Wound: dressing dry intact limited visualation wounds intact normal ecchymoses without hematoma drains serousanguinous  Disposition: Discharge disposition: 01-Home or Self Care       Discharge Instructions    Call MD for:  redness, tenderness, or signs of infection (pain, swelling, bleeding, redness, odor or green/yellow discharge around incision site)   Complete by: As directed    Call MD for:  temperature >100.5   Complete by: As directed    Discharge instructions   Complete by: As directed    Ok to remove dressings and shower am 7.21.21 Soap and water ok, pat incisions dry. No creams or ointments over incisions. Do not let drains dangle in shower, attach to lanyard or similar.Strip and record drains twice daily and bring log to clinic visit.  Knee high TED hose at minimum all other times. Ok to reapply Ace wraps to thighs as desired.  Keep legs elevated while sitting. No house yard work or exercise until cleared by MD.   Patient received Norco Rx preoperatively.   Driving Restrictions   Complete by: As directed    No driving if taking narcotics   Nursing communication   Complete by: As directed    Please administer am Lovenox prior to discharge   Resume previous  diet   Complete by: As directed      Allergies as of 02/16/2020      Reactions   Penicillins Anaphylaxis, Other (See Comments)   Has patient had a PCN reaction causing immediate rash, facial/tongue/throat swelling, SOB or lightheadedness with hypotension: Yes Has patient had a PCN reaction causing severe rash involving mucus membranes or skin necrosis: Yes Has patient had a PCN reaction that required hospitalization: Yes Has patient had a PCN reaction occurring within the last 10 years: No If all of the above answers are "NO", then may proceed with Cephalosporin use.   Oxycodone Itching, Nausea Only, Other (See Comments)   "bugs crawling on me". Can take if takes Benadryl first    Prednisone Other (See Comments)   Insomnia,  Unable to sleep    Quinolones Other (See Comments)   BLURRY VISION   Chlorhexidine Rash   Abdominal rash   Latex Rash, Other (See Comments)   Latex adhesives tape only per pt.Not allergic to latex gloves or other products, USE PAPER TAPE ONLY PLEASE      Medication List    STOP taking these medications   promethazine 50 MG tablet Commonly known as: PHENERGAN     TAKE these medications   albuterol 108 (90 Base) MCG/ACT inhaler Commonly known as: VENTOLIN HFA Inhale 2 puffs into the lungs every 6 (six) hours as needed for wheezing or shortness of breath.   CALCIUM/VITAMIN D3 GUMMIES PO Take 1 each by mouth 3 (three) times daily.   MULTIVITAMIN PO Take 1 tablet by mouth 2 (two) times daily.  ondansetron 4 MG disintegrating tablet Commonly known as: ZOFRAN-ODT Take 4 mg by mouth 3 (three) times daily as needed for nausea or vomiting.   pantoprazole 40 MG tablet Commonly known as: PROTONIX Take 1 tablet (40 mg total) by mouth 2 (two) times daily.       Follow-up Information    Glenna Fellows, MD In 1 week.   Specialty: Plastic Surgery Why: as scheduled Contact information: [address] [place]  [address] [phone]               Signed: Glenna Fellows 02/16/2020, 6:58 AM

## 2020-03-30 DIAGNOSIS — U071 COVID-19: Secondary | ICD-10-CM

## 2020-03-30 HISTORY — DX: COVID-19: U07.1

## 2020-04-13 DIAGNOSIS — U071 COVID-19: Secondary | ICD-10-CM | POA: Diagnosis not present

## 2020-04-28 DIAGNOSIS — R0981 Nasal congestion: Secondary | ICD-10-CM | POA: Diagnosis not present

## 2020-04-28 DIAGNOSIS — Z8616 Personal history of COVID-19: Secondary | ICD-10-CM | POA: Diagnosis not present

## 2020-06-02 DIAGNOSIS — B9689 Other specified bacterial agents as the cause of diseases classified elsewhere: Secondary | ICD-10-CM | POA: Diagnosis not present

## 2020-06-02 DIAGNOSIS — J329 Chronic sinusitis, unspecified: Secondary | ICD-10-CM | POA: Diagnosis not present

## 2020-06-13 DIAGNOSIS — R059 Cough, unspecified: Secondary | ICD-10-CM | POA: Diagnosis not present

## 2020-06-13 DIAGNOSIS — J4521 Mild intermittent asthma with (acute) exacerbation: Secondary | ICD-10-CM | POA: Diagnosis not present

## 2020-07-14 DIAGNOSIS — Z9884 Bariatric surgery status: Secondary | ICD-10-CM | POA: Diagnosis not present

## 2020-07-30 HISTORY — PX: REDUCTION MAMMAPLASTY: SUR839

## 2020-08-08 ENCOUNTER — Ambulatory Visit: Payer: PPO | Admitting: Gastroenterology

## 2020-08-22 DIAGNOSIS — G2581 Restless legs syndrome: Secondary | ICD-10-CM | POA: Diagnosis not present

## 2020-08-22 DIAGNOSIS — J4540 Moderate persistent asthma, uncomplicated: Secondary | ICD-10-CM | POA: Diagnosis not present

## 2020-08-22 DIAGNOSIS — U099 Post covid-19 condition, unspecified: Secondary | ICD-10-CM | POA: Diagnosis not present

## 2020-08-22 DIAGNOSIS — R0982 Postnasal drip: Secondary | ICD-10-CM | POA: Diagnosis not present

## 2020-09-08 ENCOUNTER — Ambulatory Visit: Payer: PPO | Admitting: Gastroenterology

## 2020-09-12 DIAGNOSIS — R918 Other nonspecific abnormal finding of lung field: Secondary | ICD-10-CM | POA: Diagnosis not present

## 2020-09-26 DIAGNOSIS — G2581 Restless legs syndrome: Secondary | ICD-10-CM | POA: Diagnosis not present

## 2020-09-26 DIAGNOSIS — J399 Disease of upper respiratory tract, unspecified: Secondary | ICD-10-CM | POA: Diagnosis not present

## 2020-09-26 DIAGNOSIS — M79662 Pain in left lower leg: Secondary | ICD-10-CM | POA: Diagnosis not present

## 2020-09-26 DIAGNOSIS — M79661 Pain in right lower leg: Secondary | ICD-10-CM | POA: Diagnosis not present

## 2020-09-26 DIAGNOSIS — R0989 Other specified symptoms and signs involving the circulatory and respiratory systems: Secondary | ICD-10-CM | POA: Diagnosis not present

## 2020-10-11 ENCOUNTER — Other Ambulatory Visit: Payer: Self-pay

## 2020-10-11 ENCOUNTER — Ambulatory Visit (INDEPENDENT_AMBULATORY_CARE_PROVIDER_SITE_OTHER): Payer: PPO | Admitting: Gastroenterology

## 2020-10-11 VITALS — BP 101/57 | HR 73 | Ht 67.0 in | Wt 119.8 lb

## 2020-10-11 DIAGNOSIS — K22719 Barrett's esophagus with dysplasia, unspecified: Secondary | ICD-10-CM | POA: Diagnosis not present

## 2020-10-11 DIAGNOSIS — K2100 Gastro-esophageal reflux disease with esophagitis, without bleeding: Secondary | ICD-10-CM

## 2020-10-11 DIAGNOSIS — Z1211 Encounter for screening for malignant neoplasm of colon: Secondary | ICD-10-CM

## 2020-10-11 DIAGNOSIS — K219 Gastro-esophageal reflux disease without esophagitis: Secondary | ICD-10-CM | POA: Diagnosis not present

## 2020-10-11 MED ORDER — SUTAB 1479-225-188 MG PO TABS: 376.0000 mg | ORAL_TABLET | ORAL | 0 refills | Status: DC

## 2020-10-11 MED ORDER — PANTOPRAZOLE SODIUM 40 MG PO TBEC: 40.0000 mg | DELAYED_RELEASE_TABLET | Freq: Two times a day (BID) | ORAL | 6 refills | Status: DC

## 2020-10-11 NOTE — Progress Notes (Signed)
Primary Care Physician: Titus Mould, NP  Primary Gastroenterologist:  Dr. Midge Minium  No chief complaint on file.   HPI: [name] is a 45 y.o. female here for follow-up after seeing me back in 2019 for an upper endoscopy.  The patient is status post gastric bypass and had staples removed from her anastomosis during the endoscopy and states that her abdominal pain improved.  The patient is now following up because she was told that she needs to follow-up for her Barrett's esophagus by her bariatric surgeon. The patient is also due for a screening colonoscopy. She reports that she ran out of her PPI a few months ago and has not been taking it.  Past Medical History:  Diagnosis Date  . Anemia    Low iron  . Anxiety    Panic Disorder  . Arthritis    back and knees, left elbow  . Asthma    long time since asthma attack  . Bronchitis    Chronic  . Cough    hx of bronchitis  . GERD (gastroesophageal reflux disease)   . Migraines    allergies  . Motion sickness   . Neuromuscular disorder (HCC)    numbness Right leg    Current Outpatient Medications  Medication Sig Dispense Refill  . albuterol (PROVENTIL HFA;VENTOLIN HFA) 108 (90 BASE) MCG/ACT inhaler Inhale 2 puffs into the lungs every 6 (six) hours as needed for wheezing or shortness of breath.     . Ca Phosphate-Cholecalciferol (CALCIUM/VITAMIN D3 GUMMIES PO) Take 1 each by mouth 3 (three) times daily.    . montelukast (SINGULAIR) 10 MG tablet Take 10 mg by mouth daily.    . Multiple Vitamins-Minerals (MULTIVITAMIN PO) Take 1 tablet by mouth 2 (two) times daily.    . ondansetron (ZOFRAN-ODT) 4 MG disintegrating tablet Take 4 mg by mouth 3 (three) times daily as needed for nausea or vomiting.     Marland Kitchen rOPINIRole (REQUIP XL) 2 MG 24 hr tablet Take 2 mg by mouth daily.    . pantoprazole (PROTONIX) 40 MG tablet Take 1 tablet (40 mg total) by mouth 2 (two) times daily. 60 tablet 6  . Sodium Sulfate-Mag  Sulfate-KCl (SUTAB) [phone] MG TABS Take 376 mg by mouth as directed. 24 tablet 0   No current facility-administered medications for this visit.    Allergies as of 10/11/2020 - Review Complete 10/11/2020  Allergen Reaction Noted  . Penicillins Anaphylaxis and Other (See Comments) 08/02/2009  . Oxycodone Itching, Nausea Only, and Other (See Comments) 12/31/2014  . Prednisone Other (See Comments) 12/31/2014  . Quinolones Other (See Comments) 12/31/2014  . Chlorhexidine Rash 02/15/2020  . Latex Rash and Other (See Comments) 12/31/2014    ROS:  General: Negative for anorexia, weight loss, fever, chills, fatigue, weakness. ENT: Negative for hoarseness, difficulty swallowing , nasal congestion. CV: Negative for chest pain, angina, palpitations, dyspnea on exertion, peripheral edema.  Respiratory: Negative for dyspnea at rest, dyspnea on exertion, cough, sputum, wheezing.  GI: See history of present illness. GU:  Negative for dysuria, hematuria, urinary incontinence, urinary frequency, nocturnal urination.  Endo: Negative for unusual weight change.    Physical Examination:   BP (!) 101/57   Pulse 73   Ht 5\' 7"  (1.702 m)   Wt 119 lb 12.8 oz (54.3 kg)   BMI 18.76 kg/m   General: Well-nourished, well-developed in no acute distress.  Eyes: No icterus. Conjunctivae pink. Lungs: Clear to auscultation bilaterally. Non-labored. Heart: Regular rate  and rhythm, no murmurs rubs or gallops.  Abdomen: Bowel sounds are normal, nontender, nondistended, no hepatosplenomegaly or masses, no abdominal bruits or hernia , no rebound or guarding.   Extremities: No lower extremity edema. No clubbing or deformities. Neuro: Alert and oriented x 3.  Grossly intact. Skin: Warm and dry, no jaundice.   Psych: Alert and cooperative, normal mood and affect.  Labs:    Imaging Studies: No results found.  Assessment and Plan:   [name] is a 45 y.o. y/o female who comes in today with a  history of Barrett's esophagus and the patient will be started back on her PPI. The patient will also be set up for an EGD and colonoscopy for screening purposes and for the history of Barrett's esophagus.  The patient has been explained the plan and agrees with it.     Midge Minium, MD. Clementeen Graham    Note: This dictation was prepared with Dragon dictation along with smaller phrase technology. Any transcriptional errors that result from this process are unintentional.

## 2020-10-13 DIAGNOSIS — Z9889 Other specified postprocedural states: Secondary | ICD-10-CM | POA: Diagnosis not present

## 2020-10-17 ENCOUNTER — Encounter: Payer: Self-pay | Admitting: Gastroenterology

## 2020-10-17 ENCOUNTER — Other Ambulatory Visit: Payer: Self-pay

## 2020-10-20 ENCOUNTER — Other Ambulatory Visit
Admission: RE | Admit: 2020-10-20 | Discharge: 2020-10-20 | Disposition: A | Discharge status: Home / Self Care | Payer: 59 | Source: Ambulatory Visit | Attending: Gastroenterology | Admitting: Gastroenterology

## 2020-10-20 ENCOUNTER — Other Ambulatory Visit: Payer: Self-pay

## 2020-10-20 DIAGNOSIS — Z20822 Contact with and (suspected) exposure to covid-19: Secondary | ICD-10-CM | POA: Diagnosis not present

## 2020-10-20 DIAGNOSIS — Z01812 Encounter for preprocedural laboratory examination: Secondary | ICD-10-CM | POA: Diagnosis not present

## 2020-10-20 LAB — SARS CORONAVIRUS 2 (TAT 6-24 HRS): SARS Coronavirus 2: NEGATIVE

## 2020-10-21 NOTE — Discharge Instructions (Signed)

## 2020-10-24 ENCOUNTER — Encounter: Payer: Self-pay | Admitting: Gastroenterology

## 2020-10-24 ENCOUNTER — Ambulatory Visit
Admission: RE | Admit: 2020-10-24 | Discharge: 2020-10-24 | Disposition: A | Discharge status: Home / Self Care | Payer: 59 | Attending: Gastroenterology | Admitting: Gastroenterology

## 2020-10-24 ENCOUNTER — Ambulatory Visit: Payer: 59 | Admitting: Anesthesiology

## 2020-10-24 ENCOUNTER — Encounter
Admission: RE | Disposition: A | Discharge status: Home / Self Care | Payer: Self-pay | Source: Home / Self Care | Attending: Gastroenterology

## 2020-10-24 ENCOUNTER — Other Ambulatory Visit: Payer: Self-pay

## 2020-10-24 DIAGNOSIS — K219 Gastro-esophageal reflux disease without esophagitis: Secondary | ICD-10-CM | POA: Diagnosis not present

## 2020-10-24 DIAGNOSIS — Z91040 Latex allergy status: Secondary | ICD-10-CM | POA: Insufficient documentation

## 2020-10-24 DIAGNOSIS — Z8616 Personal history of COVID-19: Secondary | ICD-10-CM | POA: Insufficient documentation

## 2020-10-24 DIAGNOSIS — K2270 Barrett's esophagus without dysplasia: Secondary | ICD-10-CM | POA: Insufficient documentation

## 2020-10-24 DIAGNOSIS — Z8041 Family history of malignant neoplasm of ovary: Secondary | ICD-10-CM | POA: Diagnosis not present

## 2020-10-24 DIAGNOSIS — Z7952 Long term (current) use of systemic steroids: Secondary | ICD-10-CM | POA: Insufficient documentation

## 2020-10-24 DIAGNOSIS — Z87891 Personal history of nicotine dependence: Secondary | ICD-10-CM | POA: Diagnosis not present

## 2020-10-24 DIAGNOSIS — Z1211 Encounter for screening for malignant neoplasm of colon: Secondary | ICD-10-CM | POA: Insufficient documentation

## 2020-10-24 DIAGNOSIS — Z888 Allergy status to other drugs, medicaments and biological substances status: Secondary | ICD-10-CM | POA: Diagnosis not present

## 2020-10-24 DIAGNOSIS — Z980 Intestinal bypass and anastomosis status: Secondary | ICD-10-CM | POA: Insufficient documentation

## 2020-10-24 DIAGNOSIS — Z880 Allergy status to penicillin: Secondary | ICD-10-CM | POA: Diagnosis not present

## 2020-10-24 DIAGNOSIS — Z79899 Other long term (current) drug therapy: Secondary | ICD-10-CM | POA: Insufficient documentation

## 2020-10-24 DIAGNOSIS — Z885 Allergy status to narcotic agent status: Secondary | ICD-10-CM | POA: Diagnosis not present

## 2020-10-24 DIAGNOSIS — K2100 Gastro-esophageal reflux disease with esophagitis, without bleeding: Secondary | ICD-10-CM

## 2020-10-24 HISTORY — DX: Restless legs syndrome: G25.81

## 2020-10-24 HISTORY — PX: COLONOSCOPY WITH PROPOFOL: SHX5780

## 2020-10-24 HISTORY — PX: ESOPHAGOGASTRODUODENOSCOPY (EGD) WITH PROPOFOL: SHX5813

## 2020-10-24 SURGERY — COLONOSCOPY WITH PROPOFOL
Anesthesia: General | Site: Rectum

## 2020-10-24 MED ORDER — SODIUM CHLORIDE 0.9 % IV SOLN: INTRAVENOUS | Status: DC

## 2020-10-24 MED ORDER — PROPOFOL 10 MG/ML IV BOLUS
INTRAVENOUS | Status: DC | PRN
  Administered 2020-10-24 (×5): 20 mg via INTRAVENOUS
  Administered 2020-10-24: 10 mg via INTRAVENOUS
  Administered 2020-10-24: 80 mg via INTRAVENOUS
  Administered 2020-10-24: 10 mg via INTRAVENOUS
  Administered 2020-10-24 (×2): 20 mg via INTRAVENOUS

## 2020-10-24 MED ORDER — STERILE WATER FOR IRRIGATION IR SOLN: Status: DC | PRN

## 2020-10-24 MED ORDER — LIDOCAINE HCL (CARDIAC) PF 100 MG/5ML IV SOSY
PREFILLED_SYRINGE | INTRAVENOUS | Status: DC | PRN
  Administered 2020-10-24: 50 mg via INTRAVENOUS

## 2020-10-24 MED ORDER — LACTATED RINGERS IV SOLN: INTRAVENOUS | Status: DC

## 2020-10-24 SURGICAL SUPPLY — 8 items
BLOCK BITE 60FR ADLT L/F GRN (MISCELLANEOUS) ×3 IMPLANT
FORCEPS BIOP RAD 4 LRG CAP 4 (CUTTING FORCEPS) ×1 IMPLANT
GOWN CVR UNV OPN BCK APRN NK (MISCELLANEOUS) ×4 IMPLANT
GOWN ISOL THUMB LOOP REG UNIV (MISCELLANEOUS) ×6
KIT PRC NS LF DISP ENDO (KITS) ×2 IMPLANT
KIT PROCEDURE OLYMPUS (KITS) ×3
MANIFOLD NEPTUNE II (INSTRUMENTS) ×3 IMPLANT
WATER STERILE IRR 250ML POUR (IV SOLUTION) ×3 IMPLANT

## 2020-10-24 NOTE — H&P (Signed)
Tamara Minium, MD [facility] [address] [place], [place] [address] Phone:[phone] Fax : [phone]  Primary Care Physician:  [facility] Primary Gastroenterologist:  Dr. Servando Snare  Pre-Procedure History & Physical: HPI:  Darlen Welch is a 45 y.o. female is here for an endoscopy and colonoscopy.   Past Medical History:  Diagnosis Date  . Anemia    Low iron  . Anxiety    Panic Disorder  . Arthritis    back and knees, left elbow  . Asthma    long time since asthma attack  . Bronchitis    Chronic  . Cough    hx of bronchitis  . COVID-19 03/2020  . GERD (gastroesophageal reflux disease)   . Migraines    allergies  . Motion sickness   . Neuromuscular disorder (HCC)    numbness Right leg  . RLS (restless legs syndrome)     Past Surgical History:  Procedure Laterality Date  . ABDOMINAL HYSTERECTOMY     2004  . ACHILLES TENDON SURGERY Right 05/14/2019   Procedure: ACHILLES REPAIR-SECONDARY;  Surgeon: Recardo Evangelist, DPM;  Location: [facility];  Service: Podiatry;  Laterality: Right;  lma experel  . BACK SURGERY  07/10/2006   L4-L5   . BACK SURGERY  2010  . CALCANEAL OSTEOTOMY Right 05/14/2019   Procedure: HAGLUNDS/RETROCALCANEAL;  Surgeon: Recardo Evangelist, DPM;  Location: [facility];  Service: Podiatry;  Laterality: Right;  . CARPAL TUNNEL RELEASE     right along with trigger finger   . ESOPHAGOGASTRODUODENOSCOPY N/A 10/03/2017   Procedure: ESOPHAGOGASTRODUODENOSCOPY (EGD) WITH POSSIBLE BIOPSY;  Surgeon: Gaynelle Adu, MD;  Location: [facility];  Service: General;  Laterality: N/A;  . ESOPHAGOGASTRODUODENOSCOPY (EGD) WITH PROPOFOL N/A 05/17/2016   Procedure: ESOPHAGOGASTRODUODENOSCOPY (EGD) WITH PROPOFOL;  Surgeon: Tamara Minium, MD;  Location: [facility];  Service: Endoscopy;  Laterality: N/A;  . ESOPHAGOGASTRODUODENOSCOPY (EGD) WITH PROPOFOL N/A 11/29/2017   Procedure: ESOPHAGOGASTRODUODENOSCOPY (EGD) WITH PROPOFOL;   Surgeon: Tamara Minium, MD;  Location: [facility];  Service: Endoscopy;  Laterality: N/A;  . ETHMOIDECTOMY Bilateral 04/07/2015   Procedure:  TOTAL ETHMOIDECTOMIES;  Surgeon: Vernie Murders, MD;  Location: [facility];  Service: ENT;  Laterality: Bilateral;  . FRONTAL SINUS EXPLORATION Bilateral 04/07/2015   Procedure: FRONTAL SINUSOTOMIES;  Surgeon: Vernie Murders, MD;  Location: [facility];  Service: ENT;  Laterality: Bilateral;  . IMAGE GUIDED SINUS SURGERY N/A 04/07/2015   Procedure: IMAGE GUIDED SINUS SURGERY;  Surgeon: Vernie Murders, MD;  Location: [facility];  Service: ENT;  Laterality: N/A;  GAVE DISK TO BRENDA  . LAPAROSCOPIC ROUX-EN-Y GASTRIC BYPASS WITH HIATAL HERNIA REPAIR N/A 07/09/2017   Procedure: LAPAROSCOPIC ROUX-EN-Y GASTRIC BYPASS WITH HIATAL HERNIA REPAIR UPPER ENDO;  Surgeon: Gaynelle Adu, MD;  Location: [facility];  Service: General;  Laterality: N/A;  . LIPECTOMY Bilateral 02/15/2020   THIGHS   . MASTOPEXY Bilateral 10/13/2019   Procedure: BILATERAL MASTOPEXY;  Surgeon: Glenna Fellows, MD;  Location: [facility];  Service: Plastics;  Laterality: Bilateral;  . MAXILLARY ANTROSTOMY Bilateral 04/07/2015   Procedure: MAXILLARY ANTROSTOMIES;  Surgeon: Vernie Murders, MD;  Location: [facility];  Service: ENT;  Laterality: Bilateral;  . MYELOGRAM    . PANNICULECTOMY Bilateral 10/13/2019   Procedure: ABDOMINAL PANNICULECTOMY;  Surgeon: Glenna Fellows, MD;  Location: [facility];  Service: Plastics;  Laterality: Bilateral;  . SEPTOPLASTY N/A 04/07/2015   Procedure: SEPTOPLASTY;  Surgeon: Vernie Murders, MD;  Location: [facility];  Service: ENT;  Laterality: N/A;  . TONSILLECTOMY    . ULNAR NERVE TRANSPOSITION    . UPPER GI ENDOSCOPY  07/09/2017   Procedure: UPPER GI ENDOSCOPY;  Surgeon: Gaynelle Adu, MD;  Location: [facility];  Service: General;;    Prior to Admission medications   Medication Sig Start Date End Date  Taking? Authorizing Provider  albuterol (PROVENTIL HFA;VENTOLIN HFA) 108 (90 BASE) MCG/ACT inhaler Inhale 2 puffs into the lungs every 6 (six) hours as needed for wheezing or shortness of breath.  08/19/14  Yes [provider]  Ca Phosphate-Cholecalciferol (CALCIUM/VITAMIN D3 GUMMIES PO) Take 1 each by mouth 3 (three) times daily.   Yes [provider]  melatonin 5 MG TABS Take 10 mg by mouth at bedtime.   Yes [provider]  montelukast (SINGULAIR) 10 MG tablet Take 10 mg by mouth daily. 09/20/20  Yes [provider]  Multiple Vitamins-Minerals (MULTIVITAMIN PO) Take 1 tablet by mouth 2 (two) times daily.   Yes [provider]  ondansetron (ZOFRAN-ODT) 4 MG disintegrating tablet Take 4 mg by mouth 3 (three) times daily as needed for nausea or vomiting.  09/10/17  Yes [provider]  pantoprazole (PROTONIX) 40 MG tablet Take 1 tablet (40 mg total) by mouth 2 (two) times daily. 10/11/20  Yes Tamara Minium, MD  rOPINIRole (REQUIP XL) 2 MG 24 hr tablet Take 2 mg by mouth daily. 09/21/20  Yes [provider]  Sodium Sulfate-Mag Sulfate-KCl (SUTAB) [phone] MG TABS Take 376 mg by mouth as directed. 10/11/20   Tamara Minium, MD    Allergies as of 10/11/2020 - Review Complete 10/11/2020  Allergen Reaction Noted  . Penicillins Anaphylaxis and Other (See Comments) 08/02/2009  . Oxycodone Itching, Nausea Only, and Other (See Comments) 12/31/2014  . Prednisone Other (See Comments) 12/31/2014  . Quinolones Other (See Comments) 12/31/2014  . Chlorhexidine Rash 02/15/2020  . Latex Rash and Other (See Comments) 12/31/2014    Family History  Problem Relation Age of Onset  . Ovarian cancer Sister 18  . Breast cancer Neg Hx     Social History   Socioeconomic History  . Marital status: Married    Spouse name: Not on file  . Number of children: Not on file  . Years of education: Not on file  . Highest education level: Not on file   Occupational History  . Not on file  Tobacco Use  . Smoking status: Former Smoker    Packs/day: 0.25    Years: 20.00    Pack years: 5.00    Types: Cigarettes    Quit date: 07/09/2017    Years since quitting: 3.2  . Smokeless tobacco: Never Used  Vaping Use  . Vaping Use: Never used  Substance and Sexual Activity  . Alcohol use: No  . Drug use: No  . Sexual activity: Not on file  Other Topics Concern  . Not on file  Social History Narrative  . Not on file   Social Determinants of Health   Financial Resource Strain: Not on file  Food Insecurity: Not on file  Transportation Needs: Not on file  Physical Activity: Not on file  Stress: Not on file  Social Connections: Not on file  Intimate Partner Violence: Not on file    Review of Systems: See HPI, otherwise negative ROS  Physical Exam: BP 95/65   Pulse 78   Temp (!) 97.4 F (36.3 C)   Ht 5\' 7"  (1.702 m)   Wt 53.5 kg  SpO2 99%   BMI 18.48 kg/m  General:   Alert,  pleasant and cooperative in NAD Head:  Normocephalic and atraumatic. Neck:  Supple; no masses or thyromegaly. Lungs:  Clear throughout to auscultation.    Heart:  Regular rate and rhythm. Abdomen:  Soft, nontender and nondistended. Normal bowel sounds, without guarding, and without rebound.   Neurologic:  Alert and  oriented x4;  grossly normal neurologically.  Impression/Plan: Newt Minion is here for an endoscopy and colonoscopy to be performed for screening and history of Barrett's  Risks, benefits, limitations, and alternatives regarding  endoscopy and colonoscopy have been reviewed with the patient.  Questions have been answered.  All parties agreeable.   Tamara Minium, MD  10/24/2020, 8:46 AM

## 2020-10-24 NOTE — Op Note (Signed)
[facility] Patient Name: [name] Procedure Date: 10/24/2020 9:12 AM MRN: [id number] Account #: [id number] Date of Birth: 05/01/1976 Admit Type: Outpatient Age: 45 Room: [facility] OR ROOM 01 Gender: Female Note Status: Finalized Procedure:             Upper GI endoscopy Indications:           Surveillance for malignancy due to personal history of                         Barrett's esophagus Providers:             Midge Minium MD, MD Medicines:             Propofol per Anesthesia Complications:         No immediate complications. Procedure:             Pre-Anesthesia Assessment:                        - Prior to the procedure, a History and Physical was                         performed, and patient medications and allergies were                         reviewed. The patient's tolerance of previous                         anesthesia was also reviewed. The risks and benefits                         of the procedure and the sedation options and risks                         were discussed with the patient. All questions were                         answered, and informed consent was obtained. Prior                         Anticoagulants: The patient has taken no previous                         anticoagulant or antiplatelet agents. ASA Grade                         Assessment: II - A patient with mild systemic disease.                         After reviewing the risks and benefits, the patient                         was deemed in satisfactory condition to undergo the                         procedure.                        After obtaining informed consent, the endoscope was  passed under direct vision. Throughout the procedure,                         the patient's blood pressure, pulse, and oxygen                         saturations were monitored continuously. The was                         introduced through the mouth, and  advanced to the                         afferent and efferent jejunal loops. The upper GI                         endoscopy was accomplished without difficulty. The                         patient tolerated the procedure well. Findings:      There were esophageal mucosal changes consistent with Barrett's       esophagus present in the lower third of the esophagus. The maximum       longitudinal extent of these mucosal changes was 3 cm in length. This       was biopsied with a cold forceps for histology.      Evidence of a Roux-en-Y gastrojejunostomy was found. The gastrojejunal       anastomosis was characterized by healthy appearing mucosa. The       duodenum-to-jejunum limb was examined.      The examined jejunum was normal. Impression:            - Esophageal mucosal changes consistent with Barrett's                         esophagus. Biopsied.                        - Roux-en-Y gastrojejunostomy with gastrojejunal                         anastomosis characterized by healthy appearing mucosa.                        - Normal examined jejunum. Recommendation:        - Discharge patient to home.                        - Resume previous diet.                        - Continue present medications.                        - Perform a colonoscopy. Procedure Code(s):     --- Professional ---                        [phone], Esophagogastroduodenoscopy, flexible,                         transoral; with biopsy, single or multiple Diagnosis Code(s):     --- Professional ---  K22.70, Barrett's esophagus without dysplasia                        Z98.0, Intestinal bypass and anastomosis status CPT copyright 2019 American Medical Association. All rights reserved. The codes documented in this report are preliminary and upon coder review may  be revised to meet current compliance requirements. Midge Minium MD, MD 10/24/2020 9:24:29 AM This report has been signed  electronically. Number of Addenda: 0 Note Initiated On: 10/24/2020 9:12 AM Estimated Blood Loss:  Estimated blood loss: none.      [facility]

## 2020-10-24 NOTE — Transfer of Care (Signed)
Immediate Anesthesia Transfer of Care Note  Patient: [name]  Procedure(s) Performed: COLONOSCOPY WITH PROPOFOL (N/A Rectum) ESOPHAGOGASTRODUODENOSCOPY (EGD) WITH BIOPSY (N/A Mouth)  Patient Location: PACU  Anesthesia Type: General  Level of Consciousness: awake, alert  and patient cooperative  Airway and Oxygen Therapy: Patient Spontanous Breathing and Patient connected to supplemental oxygen  Post-op Assessment: Post-op Vital signs reviewed, Patient's Cardiovascular Status Stable, Respiratory Function Stable, Patent Airway and No signs of Nausea or vomiting  Post-op Vital Signs: Reviewed and stable  Complications: No complications documented.

## 2020-10-24 NOTE — Op Note (Addendum)
[facility] Patient Name: [name] Procedure Date: 10/24/2020 9:12 AM MRN: [id number] Account #: [id number] Date of Birth: [birth year] Admit Type: Outpatient Age: 45 Room: [facility] OR ROOM 01 Gender: Female Note Status: Finalized Procedure:             Colonoscopy Indications:           Screening for colorectal malignant neoplasm Providers:             Midge Minium MD, MD Medicines:             Propofol per Anesthesia Complications:         No immediate complications. Procedure:             Pre-Anesthesia Assessment:                        - Prior to the procedure, a History and Physical was                         performed, and patient medications and allergies were                         reviewed. The patient's tolerance of previous                         anesthesia was also reviewed. The risks and benefits                         of the procedure and the sedation options and risks                         were discussed with the patient. All questions were                         answered, and informed consent was obtained. Prior                         Anticoagulants: The patient has taken no previous                         anticoagulant or antiplatelet agents. ASA Grade                         Assessment: II - A patient with mild systemic disease.                         After reviewing the risks and benefits, the patient                         was deemed in satisfactory condition to undergo the                         procedure.                        After obtaining informed consent, the colonoscope was                         passed under direct vision. Throughout the procedure,  the patient's blood pressure, pulse, and oxygen                         saturations were monitored continuously. The                         Colonoscope was introduced through the anus and                         advanced to the the  cecum, identified by appendiceal                         orifice and ileocecal valve. The colonoscopy was                         performed without difficulty. The patient tolerated                         the procedure well. The quality of the bowel                         preparation was adequate to identify polyps. Findings:      The perianal and digital rectal examinations were normal.      The colon (entire examined portion) appeared normal. Impression:            - The entire examined colon is normal.                        - No specimens collected. Recommendation:        - Discharge patient to home.                        - Resume previous diet.                        - Continue present medications.                        - Repeat colonoscopy in 10 years for screening                         purposes. Procedure Code(s):     --- Professional ---                        [phone], Colonoscopy, flexible; diagnostic, including                         collection of specimen(s) by brushing or washing, when                         performed (separate procedure) Diagnosis Code(s):     --- Professional ---                        Z12.11, Encounter for screening for malignant neoplasm                         of colon CPT copyright 2019 American Medical Association. All rights reserved. The codes documented in this report are preliminary and upon coder review  may  be revised to meet current compliance requirements. Midge Minium MD, MD 10/24/2020 9:38:21 AM This report has been signed electronically. Number of Addenda: 0 Note Initiated On: 10/24/2020 9:12 AM Scope Withdrawal Time: 0 hours 7 minutes 52 seconds  Total Procedure Duration: 0 hours 11 minutes 4 seconds  Estimated Blood Loss:  Estimated blood loss: none.      [facility]

## 2020-10-24 NOTE — Anesthesia Postprocedure Evaluation (Signed)
Anesthesia Post Note  Patient: Caidance Sybert  Procedure(s) Performed: COLONOSCOPY WITH PROPOFOL (N/A Rectum) ESOPHAGOGASTRODUODENOSCOPY (EGD) WITH BIOPSY (N/A Mouth)     Patient location during evaluation: PACU Anesthesia Type: General Level of consciousness: awake and alert Pain management: pain level controlled Vital Signs Assessment: post-procedure vital signs reviewed and stable Respiratory status: spontaneous breathing, nonlabored ventilation, respiratory function stable and patient connected to nasal cannula oxygen Cardiovascular status: blood pressure returned to baseline and stable Postop Assessment: no apparent nausea or vomiting Anesthetic complications: no   No complications documented.  Edwyna Ready

## 2020-10-24 NOTE — Anesthesia Procedure Notes (Signed)
Procedure Name: MAC Date/Time: 10/24/2020 9:15 AM Performed by: Vanetta Shawl, CRNA Pre-anesthesia Checklist: Patient identified, Emergency Drugs available, Suction available, Timeout performed and Patient being monitored Patient Re-evaluated:Patient Re-evaluated prior to induction Oxygen Delivery Method: Nasal cannula Placement Confirmation: positive ETCO2

## 2020-10-24 NOTE — Anesthesia Preprocedure Evaluation (Signed)
Anesthesia Evaluation  Patient identified by MRN, date of birth, ID band Patient awake    Reviewed: Allergy & Precautions, H&P , NPO status , Patient's Chart, lab work & pertinent test results, reviewed documented beta blocker date and time   History of Anesthesia Complications Negative for: history of anesthetic complications  Airway Mallampati: III  TM Distance: >3 FB Neck ROM: full    Dental no notable dental hx.    Pulmonary neg pulmonary ROS, asthma , Patient abstained from smoking., former smoker,    Pulmonary exam normal breath sounds clear to auscultation       Cardiovascular Exercise Tolerance: Good negative cardio ROS Normal cardiovascular exam Rhythm:regular Rate:Normal     Neuro/Psych  Headaches, PSYCHIATRIC DISORDERS Anxiety  Neuromuscular disease negative psych ROS   GI/Hepatic Neg liver ROS, hiatal hernia, GERD  ,S/p gastric bypass   Endo/Other  negative endocrine ROSdiabetes  Renal/GU negative Renal ROS  negative genitourinary   Musculoskeletal  (+) Arthritis ,   Abdominal Normal abdominal exam  (+) - obese,  Abdomen: soft.    Peds  Hematology negative hematology ROS (+) anemia ,   Anesthesia Other Findings   Reproductive/Obstetrics negative OB ROS                             Anesthesia Physical  Anesthesia Plan  ASA: III  Anesthesia Plan: General   Post-op Pain Management:    Induction: Intravenous  PONV Risk Score and Plan: 2 and Ondansetron, Treatment may vary due to age or medical condition and Propofol infusion  Airway Management Planned: Natural Airway and Nasal Cannula  Additional Equipment:   Intra-op Plan:   Post-operative Plan:   Informed Consent: I have reviewed the patients History and Physical, chart, labs and discussed the procedure including the risks, benefits and alternatives for the proposed anesthesia with the patient or authorized  representative who has indicated his/her understanding and acceptance.     Dental Advisory Given  Plan Discussed with: CRNA and Surgeon  Anesthesia Plan Comments: (  )        Anesthesia Quick Evaluation Patient Active Problem List   Diagnosis Date Noted  . Panniculitis 10/13/2019  . Bilateral carpal tunnel syndrome 05/21/2018  . Abdominal pain, epigastric   . Status post bariatric surgery   . Dyslipidemia 07/09/2017  . Fatty liver disease, nonalcoholic 07/09/2017  . Morbid obesity (HCC) 07/09/2017  . Migraine without aura and without status migrainosus, not intractable 09/28/2016  . Dyspareunia 05/28/2016  . Heartburn   . Gastritis without bleeding   . Obesity (BMI 30-39.9) 04/09/2016  . Increased band cell count 03/14/2016  . Diet-controlled type 2 diabetes mellitus (HCC) 03/14/2016  . Barrett's esophagus with dysplasia 09/15/2014  . Trigger thumb of right hand 10/02/2012  . Neuromuscular disorder (HCC) 06/16/2012  . Obesity 06/16/2012  . Numbness and tingling in right hand 03/11/2012  . Numbness 01/24/2012  . Asthma 11/23/2011  . IRRITABLE BOWEL SYNDROME 09/05/2009  . ANXIETY 08/02/2009  . PANIC DISORDER 08/02/2009  . GERD 08/02/2009  . Hiatal hernia with GERD 08/02/2009  . DEGENERATIVE JOINT DISEASE 08/02/2009  . HEADACHE, CHRONIC 08/02/2009  . REFLUX ESOPHAGITIS, HX OF 08/02/2009    CBC Latest Ref Rng & Units 02/11/2020 10/09/2019 08/01/2018  WBC 4.0 - 10.5 K/uL 12.4(H) 10.4 7.9  Hemoglobin 12.0 - 15.0 g/dL 00.3 49.1 79.1  Hematocrit 36.0 - 46.0 % 43.1 46.3(H) 38.4  Platelets 150 - 400 K/uL 310 425(H) 347  BMP Latest Ref Rng & Units 02/11/2020 10/09/2019 08/01/2018  Glucose 70 - 99 mg/dL 810(F) 751(W) 95  BUN 6 - 20 mg/dL 9 11 12   Creatinine 0.44 - 1.00 mg/dL 2.58 5.27  BUN/Creat Ratio 9 - 23 - - 20  Sodium 135 - 145 mmol/L 140 141 142  Potassium 3.5 - 5.1 mmol/L 4.2 4.1 4.4  Chloride 98 - 111 mmol/L 103 106 103  CO2 22 - 32 mmol/L 28 24 22   Calcium  8.9 - 10.3 mg/dL 9.7 9.8 9.6    Risks and benefits of anesthesia discussed at length, patient or surrogate demonstrates understanding. Appropriately NPO. Plan to proceed with anesthesia.  7.82, MD 10/24/20

## 2020-10-25 ENCOUNTER — Encounter: Payer: Self-pay | Admitting: Gastroenterology

## 2020-10-26 LAB — SURGICAL PATHOLOGY

## 2020-10-27 ENCOUNTER — Encounter: Payer: Self-pay | Admitting: Gastroenterology

## 2020-11-11 DIAGNOSIS — M79662 Pain in left lower leg: Secondary | ICD-10-CM | POA: Diagnosis not present

## 2020-11-11 DIAGNOSIS — M79661 Pain in right lower leg: Secondary | ICD-10-CM | POA: Diagnosis not present

## 2020-12-19 DIAGNOSIS — J328 Other chronic sinusitis: Secondary | ICD-10-CM | POA: Diagnosis not present

## 2020-12-19 DIAGNOSIS — J301 Allergic rhinitis due to pollen: Secondary | ICD-10-CM | POA: Diagnosis not present

## 2020-12-22 DIAGNOSIS — J328 Other chronic sinusitis: Secondary | ICD-10-CM | POA: Diagnosis not present

## 2020-12-30 DIAGNOSIS — J328 Other chronic sinusitis: Secondary | ICD-10-CM | POA: Diagnosis not present

## 2020-12-30 DIAGNOSIS — J301 Allergic rhinitis due to pollen: Secondary | ICD-10-CM | POA: Diagnosis not present

## 2021-02-07 ENCOUNTER — Other Ambulatory Visit: Payer: Self-pay | Admitting: Gastroenterology

## 2021-05-06 DIAGNOSIS — Z20822 Contact with and (suspected) exposure to covid-19: Secondary | ICD-10-CM | POA: Diagnosis not present

## 2021-05-06 DIAGNOSIS — R5381 Other malaise: Secondary | ICD-10-CM | POA: Diagnosis not present

## 2021-05-06 DIAGNOSIS — R0602 Shortness of breath: Secondary | ICD-10-CM | POA: Diagnosis not present

## 2021-05-06 DIAGNOSIS — K760 Fatty (change of) liver, not elsewhere classified: Secondary | ICD-10-CM | POA: Diagnosis not present

## 2021-05-06 DIAGNOSIS — B9789 Other viral agents as the cause of diseases classified elsewhere: Secondary | ICD-10-CM | POA: Diagnosis not present

## 2021-05-06 DIAGNOSIS — Z87891 Personal history of nicotine dependence: Secondary | ICD-10-CM | POA: Diagnosis not present

## 2021-05-06 DIAGNOSIS — R61 Generalized hyperhidrosis: Secondary | ICD-10-CM | POA: Diagnosis not present

## 2021-05-06 DIAGNOSIS — K589 Irritable bowel syndrome without diarrhea: Secondary | ICD-10-CM | POA: Diagnosis not present

## 2021-05-06 DIAGNOSIS — R059 Cough, unspecified: Secondary | ICD-10-CM | POA: Diagnosis not present

## 2021-05-06 DIAGNOSIS — Z79899 Other long term (current) drug therapy: Secondary | ICD-10-CM | POA: Diagnosis not present

## 2021-05-06 DIAGNOSIS — J45909 Unspecified asthma, uncomplicated: Secondary | ICD-10-CM | POA: Diagnosis not present

## 2021-05-06 DIAGNOSIS — Z9884 Bariatric surgery status: Secondary | ICD-10-CM | POA: Diagnosis not present

## 2021-05-06 DIAGNOSIS — J069 Acute upper respiratory infection, unspecified: Secondary | ICD-10-CM | POA: Diagnosis not present

## 2021-05-06 DIAGNOSIS — R509 Fever, unspecified: Secondary | ICD-10-CM | POA: Diagnosis not present

## 2021-05-06 DIAGNOSIS — K22719 Barrett's esophagus with dysplasia, unspecified: Secondary | ICD-10-CM | POA: Diagnosis not present

## 2021-05-06 DIAGNOSIS — E119 Type 2 diabetes mellitus without complications: Secondary | ICD-10-CM | POA: Diagnosis not present

## 2021-05-08 DIAGNOSIS — J069 Acute upper respiratory infection, unspecified: Secondary | ICD-10-CM | POA: Diagnosis not present

## 2021-05-17 DIAGNOSIS — J209 Acute bronchitis, unspecified: Secondary | ICD-10-CM | POA: Diagnosis not present

## 2021-05-17 DIAGNOSIS — J4540 Moderate persistent asthma, uncomplicated: Secondary | ICD-10-CM | POA: Diagnosis not present

## 2021-05-17 DIAGNOSIS — R110 Nausea: Secondary | ICD-10-CM | POA: Diagnosis not present

## 2021-09-08 ENCOUNTER — Other Ambulatory Visit: Payer: Self-pay | Admitting: Neurosurgery

## 2021-09-08 DIAGNOSIS — M5417 Radiculopathy, lumbosacral region: Secondary | ICD-10-CM

## 2021-09-08 DIAGNOSIS — M4722 Other spondylosis with radiculopathy, cervical region: Secondary | ICD-10-CM

## 2021-10-02 ENCOUNTER — Ambulatory Visit
Admission: RE | Admit: 2021-10-02 | Discharge: 2021-10-02 | Disposition: A | Discharge status: Home / Self Care | Payer: 59 | Source: Ambulatory Visit | Attending: Neurosurgery | Admitting: Neurosurgery

## 2021-10-02 DIAGNOSIS — M5417 Radiculopathy, lumbosacral region: Secondary | ICD-10-CM

## 2021-10-02 DIAGNOSIS — M4722 Other spondylosis with radiculopathy, cervical region: Secondary | ICD-10-CM

## 2021-10-02 MED ORDER — GADOBENATE DIMEGLUMINE 529 MG/ML IV SOLN
11.0000 mL | Freq: Once | INTRAVENOUS | Status: AC | PRN
  Administered 2021-10-02: 11 mL via INTRAVENOUS

## 2021-10-25 ENCOUNTER — Other Ambulatory Visit: Payer: Self-pay | Admitting: Family Medicine

## 2021-10-25 DIAGNOSIS — Z1231 Encounter for screening mammogram for malignant neoplasm of breast: Secondary | ICD-10-CM

## 2021-11-01 ENCOUNTER — Ambulatory Visit: Payer: 59 | Admitting: Skilled Nursing Facility1

## 2021-11-15 ENCOUNTER — Ambulatory Visit: Payer: 59 | Admitting: Skilled Nursing Facility1

## 2021-12-18 ENCOUNTER — Encounter: Payer: Self-pay | Admitting: Skilled Nursing Facility1

## 2021-12-18 ENCOUNTER — Encounter: Payer: 59 | Attending: General Surgery | Admitting: Skilled Nursing Facility1

## 2021-12-18 DIAGNOSIS — E669 Obesity, unspecified: Secondary | ICD-10-CM | POA: Insufficient documentation

## 2021-12-18 NOTE — Progress Notes (Signed)
Bariatric Nutrition Follow-Up Visit Medical Nutrition Therapy   NUTRITION ASSESSMENT    Surgery date: 07/09/2017 Surgery type: RYGB Start weight at [facility]: 249.2 Weight today: 119 pounds     Body Composition Scale   12/10/2018 12/8/ 2020 12/18/2021  Total Body Fat % 26.9 26.4 23.9 16.6  Visceral Fat 5 5 4 3   Fat-Free Mass % 73 73.5 76 83.3   Total Body Water % 51 51.2 52.5 56.1   Muscle-Mass lbs 31 32.5 32.3 31.9  Body Fat Displacement                Torso  lbs 24.9 24.1 20.5 12.2         Left Leg  lbs 4.9 4.8 4.1 2.4         Right Leg  lbs 4.9 4.8 4.1 2.4         Left Arm  lbs 2.4 2.4 2.0 1.2         Right Arm   lbs    1.2   Clinical  Medical hx: GERD, IBS, Barrett's esophagus, DM Medications: protonix, zanaflex, albuterol, singulair  Labs: WNL   Lifestyle & Dietary Hx   (Note from 07/07/2019) Pt presents very slight temporal and clavical wasting. Hair and nails look healthy.   Currently  Upon visual inspection Presents with: temple wasting, clavicle wasting, and protruding spine   Pt state she has had no hair loss and her nails are strong with great energy. Pt does report dry eye, pt states she does experience dizziness and sometimes feels shaky. Pt states if she does forget to eat she will "get the shakes".   Pt states she reads the nutrition the facts for all foods. Pt states she is not going to end up like her girlfriend who puts everything and anything in her mouth and gained 80 pounds.   Pt states she does get nausea with certain smells. Pt states she will vomit once every 3 months and diarrhea once every 3 months.   Pt states she never measures anything.  Pt states she does not eat any muffins or bread; If there are any carbs; it is keto very low carb.   Pt states she fills her 32 ounce thermus 6 times a day.   Pt states she has leg cramps from back issues since before surgery.   Pt states she does bruise easier now.   Pt states she is open to eating more  and does not foresee any issues to this plan and is okay with gaining some weight. Goals: replete lost subcutaneous fat loss and musculature atrophy  Estimated daily fluid intake: 160+ oz Estimated daily protein intake: 120 g Supplements: multivitamin and calcium Current average weekly physical activity: treadmill and weights and other resistance: 2 times a week 60 minutes; walking dogs 5 miles 4 days a week  24-Hr Dietary Recall First Meal: coffee Snack:  1 scrambled egg + cheese or egg bite from [employer] Second Meal 1pm: grilled chicken on salad: banana pepper, cucumber, tomato, romaine, vin[place] or lunch meat rollup Snack:  non starchy vegetables or cheese stick or rarely chips Third Meal: chicken and potato or green beans Snack:  Beverages: coffee, water, unsweet tea   Using straws: no Drinking while eating: no Having you been chewing well: yes Chewing/swallowing difficulties: no Changes in vision: no Changes to mood/headaches: yes Hair loss/Changes to skin/Changes to nails: getting thicker, no, no Any difficulty focusing or concentrating: no Sweating: no Dizziness/Lightheaded: yes Palpitations: no  Carbonated  beverages: no N/V/D/C/GAS: nausea  Abdominal Pain: no Dumping syndrome: no    NUTRITION DIAGNOSIS  Overweight/obesity ([place]-3.3) related to past poor dietary habits and physical inactivity as evidenced by completed bariatric surgery and following dietary guidelines for continued weight loss and healthy nutrition status.     NUTRITION INTERVENTION Nutrition counseling (C-1) and education (E-2) to facilitate bariatric surgery goals, including: The importance of consuming adequate calories as well as certain nutrients daily due to the body's need for essential vitamins, minerals, and fats The importance of daily physical activity and to reach a goal of at least 150 minutes of moderate to vigorous physical activity weekly (or as directed by their physician) due to  benefits such as increased musculature and improved lab values The importance of intuitive eating specifically learning hunger-satiety cues and understanding the importance of learning a new body: The importance of mindful eating to avoid grazing behaviors  What happens when I don't eat?  Your body uses food as fuel to keep all the important organs and cells in your body running well. When you don't eat, your body doesn't get the fuel it needs and your organs and body parts can suffer.  The Heart & Circulation: Your heart is a muscle that can shrink and weaken when you don't eat. This can create circulation problems and an irregular heartbeat. Blood pressure can get very low during starvation and make you feel dizzy when you stand up.  The Stomach: Your stomach becomes smaller when you don't eat so when you start eating again, your stomach is likely to feel uncomfortable (you may have stomach aches and/or gas). Also, your stomach will not empty as fast making you feel full longer.  The Intestines: Your intestines will move food slowly often resulting in constipation (trouble having a bowel movement) and/or stomach aches or cramps when you eat meals.  The Brain: Your brain, which controls the rest of your body's functions, does not work properly without food. For example, you may have trouble thinking clearly or paying attention and/or you could also feel anxious or sad.  Body Cells: The balance of electrolytes in the blood can be changed with malnutrition or with purging. Without food, the amount of potassium and phosphorous can get dangerously low which can cause problems with your muscles, changes in your brain functioning, and cause life-threatening heart and rhythm problems.  Bones: When you don't eat, your bones often become weak due to low calcium and low hormone levels, which increases your risk of getting broken bones now and developing weak bones when you're older.  Body Temperature: Your body  naturally lowers its temperature in times of starvation to conserve energy and protect vital organs. When this happens, there is a decrease in circulation (blood flow) to your fingers and toes which will often cause your hands and feet to feel cold and look bluish.  Skin: Your skin becomes dry when your body is not well hydrated and when it does not get enough vitamins and minerals from food. The skin will naturally protect your body during periods of starvation by developing fine, soft hair called "lanugo" that covers the skin to keep your body warm.  Hair: When your hair doesn't get enough nourishment from the vitamins and minerals that are naturally found in healthy food, it becomes dry, thin and it can even fall out.  Nails: Your nails require nutrients in the form of vitamins and minerals from your diet. When you don't eat, you deny your body what it  needs and your nails become dry and brittle and break easily.  Teeth: Your teeth need vitamin D and calcium from food sources. Without both of these minerals, you can end up with dental problems such as tooth decay and gum disease. Purging can also destroy tooth enamel.  Learning Style & Readiness for Change Teaching method utilized: Visual & Auditory  Demonstrated degree of understanding via: Teach Back  Readiness Level: contemplative Barriers to learning/adherence to lifestyle change: fear of weight gain  RD's Notes for Next Visit Assess adherence to pt chosen goals     MONITORING & EVALUATION Dietary intake, weekly physical activity, body weight  Next Steps Patient is to follow-up in 1 month

## 2021-12-28 ENCOUNTER — Other Ambulatory Visit: Payer: Self-pay | Admitting: Gastroenterology

## 2022-01-18 ENCOUNTER — Ambulatory Visit: Payer: 59 | Admitting: Skilled Nursing Facility1

## 2022-01-28 IMAGING — MG DIGITAL SCREENING BILAT W/ TOMO W/ CAD
6 of 10 series · 6 of 30 positions shown · non-contrast
Comparison: Previous exam(s).

ACR Breast Density Category a: The breast tissue is almost entirely
fatty.

CLINICAL DATA: Screening.

EXAM:
DIGITAL SCREENING BILATERAL MAMMOGRAM WITH TOMO AND CAD

[R CC synth-2D (1 of 2)]
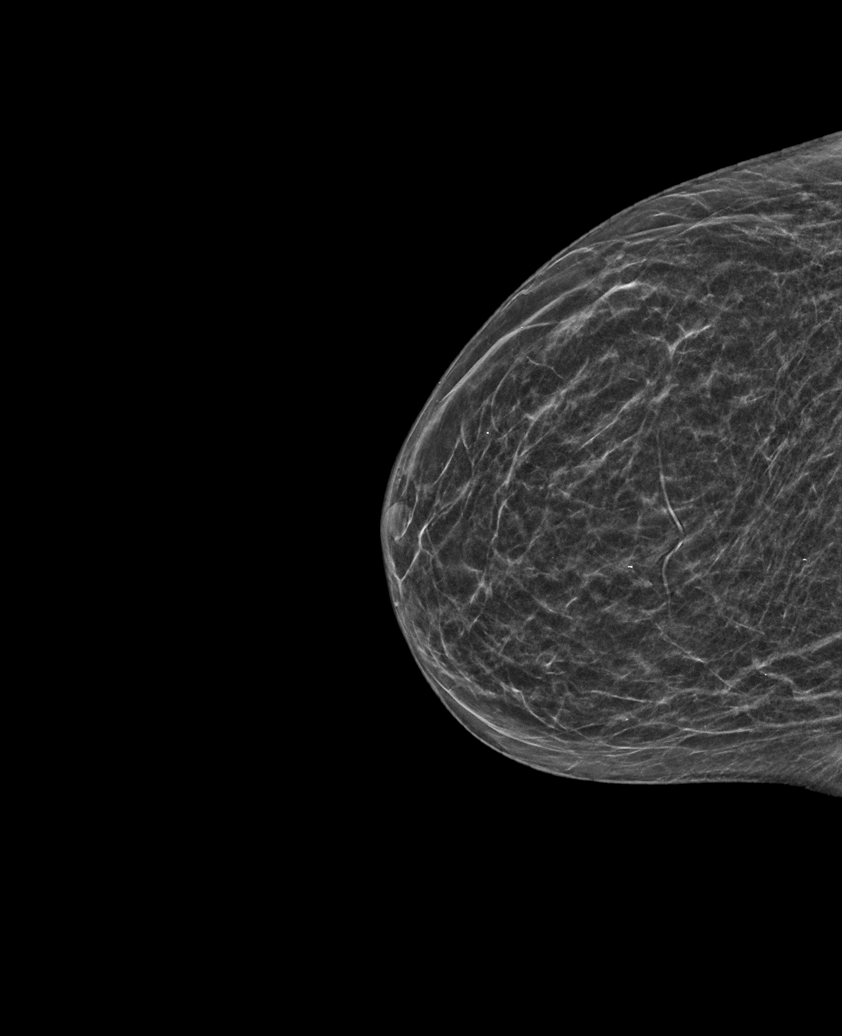

[R MLO synth-2D]
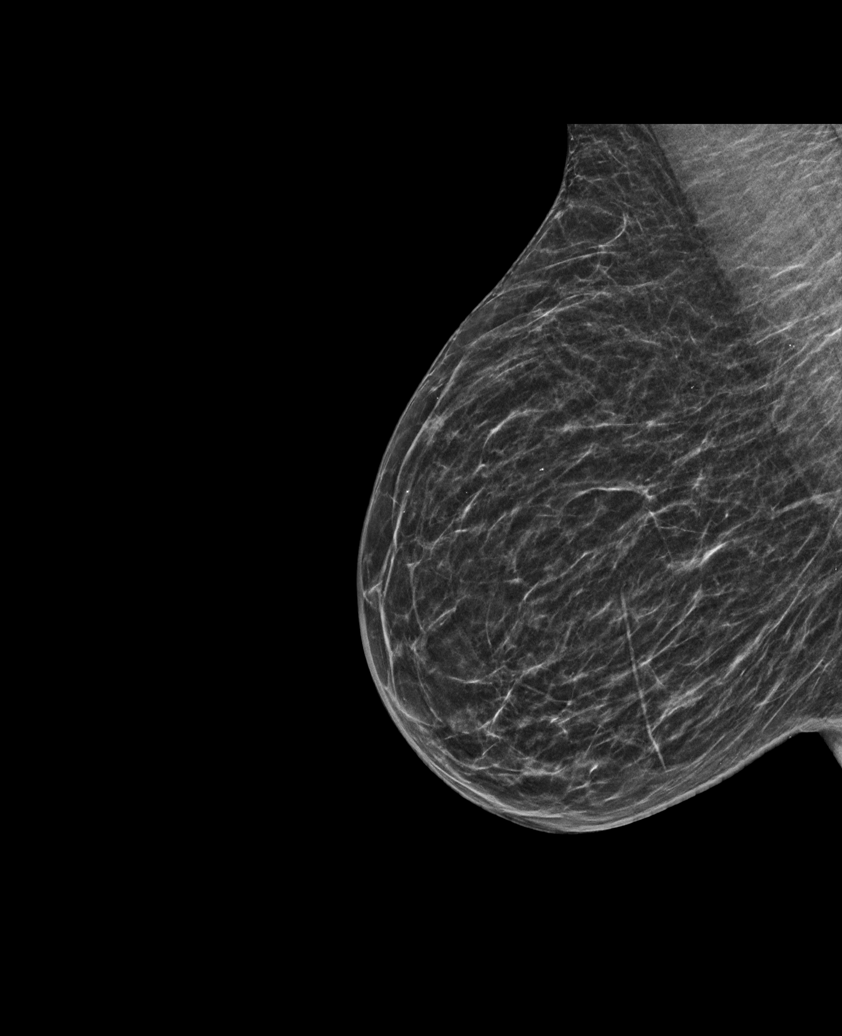

[L CC synth-2D]
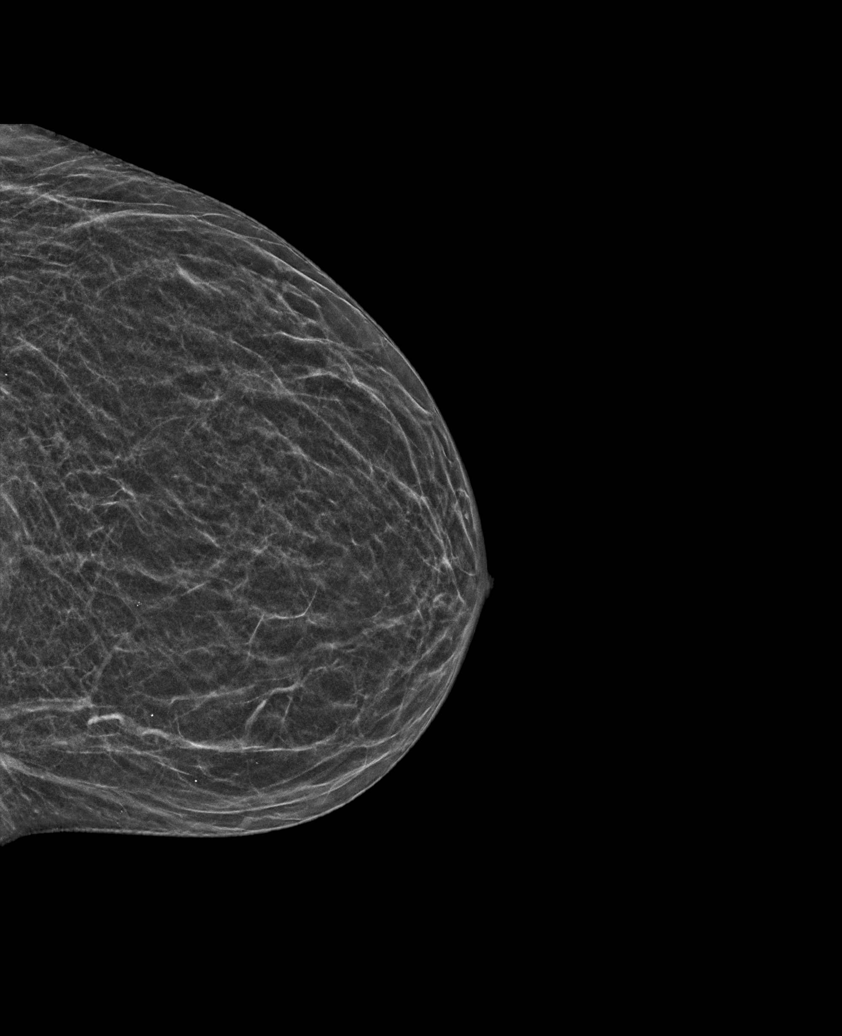

[L MLO synth-2D]
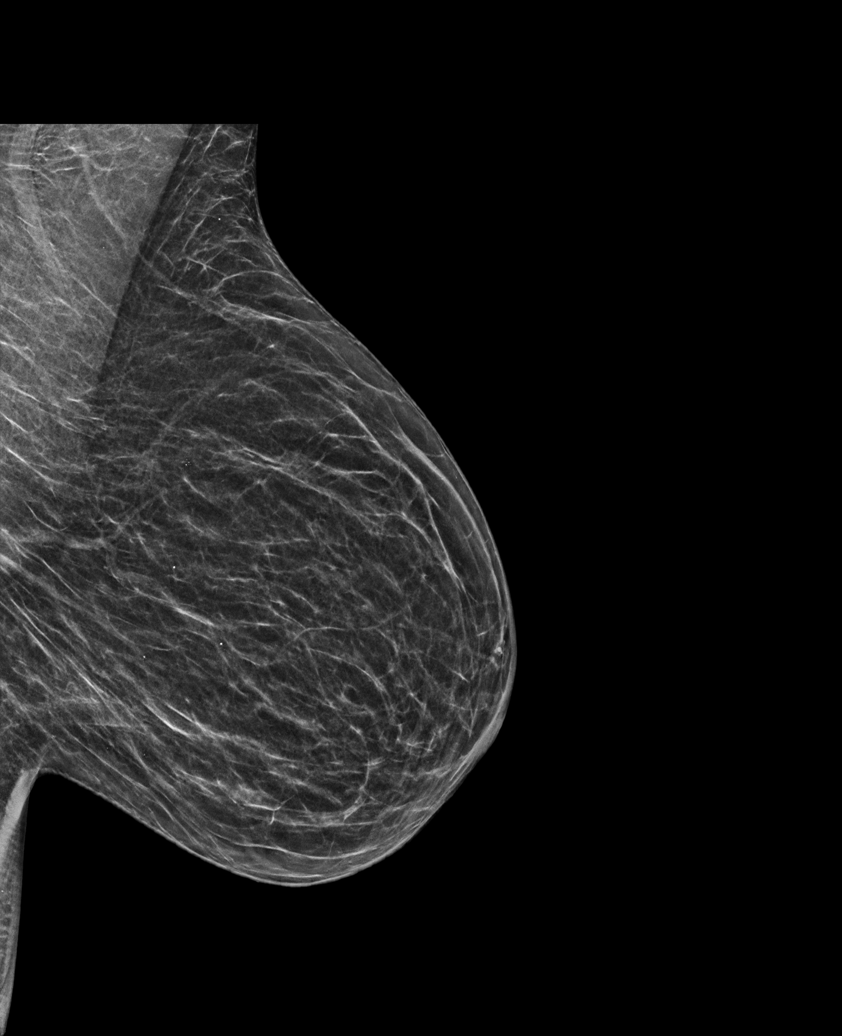

[R CC synth-2D (2 of 2)]
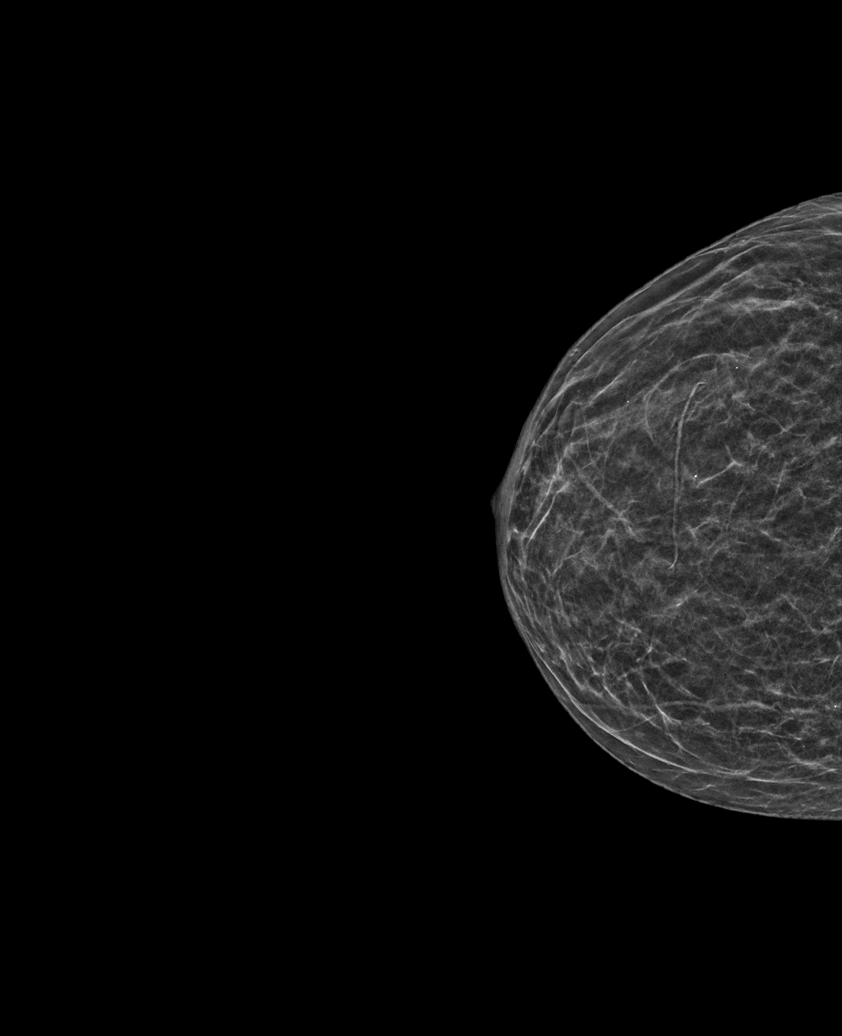

[R CC tomo · tomo slice 23/46.0]
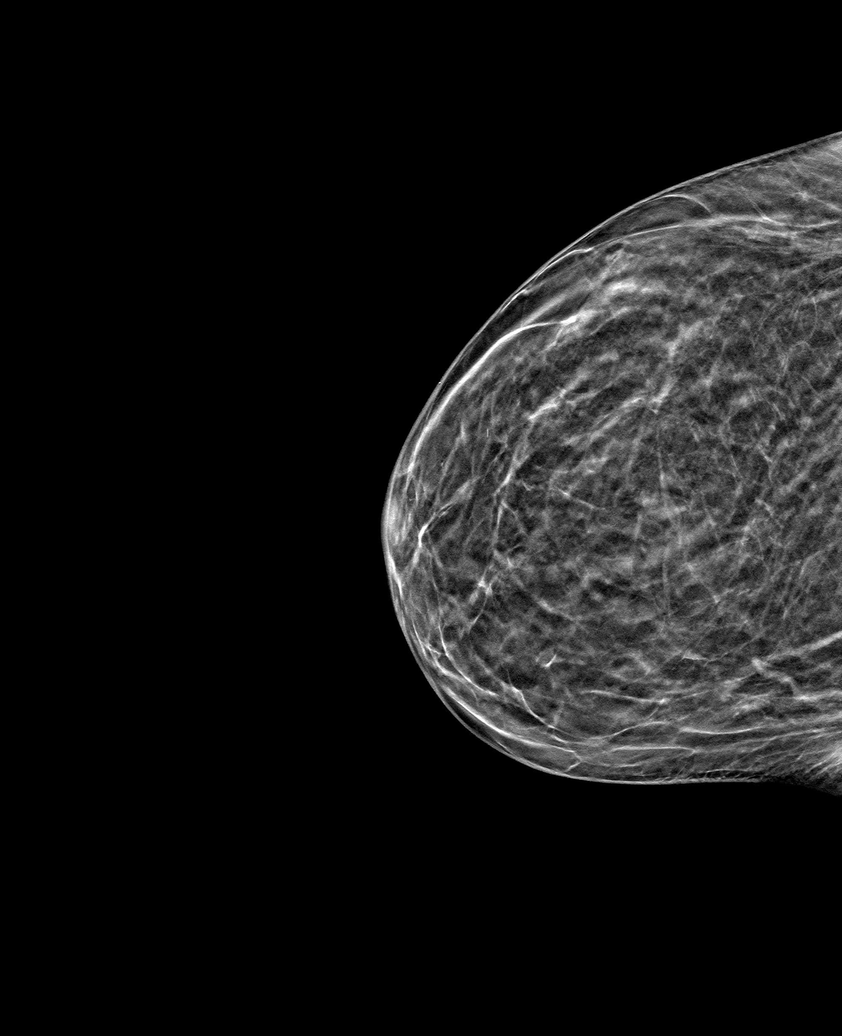

[6 of 30 positions shown; findings below may reference images not displayed]

FINDINGS: There are no findings suspicious for malignancy. Images were
processed with CAD.
IMPRESSION: No mammographic evidence of malignancy. A result letter of this
screening mammogram will be mailed directly to the patient.

RECOMMENDATION:
Screening mammogram in one year. (Code:[id number])

BI-RADS CATEGORY  1: Negative.

## 2022-02-28 ENCOUNTER — Ambulatory Visit: Payer: 59 | Admitting: Skilled Nursing Facility1

## 2022-03-23 ENCOUNTER — Other Ambulatory Visit: Payer: Self-pay | Admitting: Gastroenterology

## 2022-04-18 ENCOUNTER — Encounter: Payer: 59 | Admitting: Skilled Nursing Facility1

## 2022-04-24 ENCOUNTER — Encounter: Payer: 59 | Attending: General Surgery | Admitting: Skilled Nursing Facility1

## 2022-04-24 ENCOUNTER — Encounter: Payer: Self-pay | Admitting: Skilled Nursing Facility1

## 2022-04-24 DIAGNOSIS — E669 Obesity, unspecified: Secondary | ICD-10-CM | POA: Diagnosis present

## 2022-04-24 NOTE — Progress Notes (Signed)
Bariatric Nutrition Follow-Up Visit Medical Nutrition Therapy   NUTRITION ASSESSMENT    Surgery date: 07/09/2017 Surgery type: RYGB Start weight at [facility]: 249.2 Weight today: 124.8 pounds     Body Composition Scale   12/10/2018 12/8/ 2020 12/18/2021 04/24/2022  Total Body Fat % 26.9 26.4 23.9 16.6 18.7  Visceral Fat 5 5 4 3 3   Fat-Free Mass % 73 73.5 76 83.3 81.2   Total Body Water % 51 51.2 52.5 56.1 55.1   Muscle-Mass lbs 31 32.5 32.3 31.9 32  Body Fat Displacement                 Torso  lbs 24.9 24.1 20.5 12.2 14.3         Left Leg  lbs 4.9 4.8 4.1 2.4 2.8         Right Leg  lbs 4.9 4.8 4.1 2.4 2.8         Left Arm  lbs 2.4 2.4 2.0 1.2 1.4         Right Arm   lbs    1.2 1.4   Clinical  Medical hx: GERD, IBS, Barrett's esophagus, DM Medications: protonix, zanaflex, albuterol, singulair  Labs: WNL   Lifestyle & Dietary Hx   Currently  Upon visual inspection Presents with: temple wasting, clavicle wasting, and protruding spine   Pt states she does not bruise as easy as she was.   Pt states she gained 8 pounds and is NOT happy about it. Pt states she she is happy with not having all these sickness and she is aware weight gain does not have her as open to sickness.  Pt states she does not look at food labels anymore eating what she wants when she wants so being freed of that makes her feel so much better and not get lost in the information. Pt states her friend who is going through the same situation and she was able to gain some weight as well.   Pt state she would be okay getting to a size 4 but not 130 pounds.   Pt states she is open to eating more and does not foresee any issues to this plan and is okay with gaining some weight. Goals: replete lost subcutaneous fat loss and musculature atrophy  Estimated daily fluid intake: 160+ oz Estimated daily protein intake: 120 g Supplements: multivitamin and calcium; all lot of different supplements  Current average weekly  physical activity: treadmill and weights and other resistance: 2 times a week 60 minutes; walking dogs 5 miles 4 days a week  24-Hr Dietary Recall First Meal: scrambled egg + cheese + spinach Snack:  1 english muffin and cinn + low fat butter + splenda Second Meal 1pm: half sandwich salmi + [place] + ham + cheese + roast beef + mustard Snack:  chips Snack: granola bar Third Meal: 6 in subway sub or chicken + veggies + potatoes + sour cream or steak + potato + green bean Snack: 2 hours later 6 in sub Beverages: coffee, water, unsweet tea   Using straws: no Drinking while eating: no Having you been chewing well: yes Chewing/swallowing difficulties: no Changes in vision: no Changes to mood/headaches: yes Hair loss/Changes to skin/Changes to nails: getting thicker, no, no Any difficulty focusing or concentrating: no Sweating: no Dizziness/Lightheaded: yes Palpitations: no  Carbonated beverages: no N/V/D/C/GAS: nausea  Abdominal Pain: no Dumping syndrome: no    NUTRITION DIAGNOSIS  Overweight/obesity ([place]-3.3) related to past poor dietary habits and physical inactivity as  evidenced by completed bariatric surgery and following dietary guidelines for continued weight loss and healthy nutrition status.     NUTRITION INTERVENTION: Continued Nutrition counseling (C-1) and education (E-2) to facilitate bariatric surgery goals, including: The importance of consuming adequate calories as well as certain nutrients daily due to the body's need for essential vitamins, minerals, and fats The importance of daily physical activity and to reach a goal of at least 150 minutes of moderate to vigorous physical activity weekly (or as directed by their physician) due to benefits such as increased musculature and improved lab values The importance of intuitive eating specifically learning hunger-satiety cues and understanding the importance of learning a new body: The importance of mindful eating to avoid  grazing behaviors  What happens when I don't eat?  Your body uses food as fuel to keep all the important organs and cells in your body running well. When you don't eat, your body doesn't get the fuel it needs and your organs and body parts can suffer.  The Heart & Circulation: Your heart is a muscle that can shrink and weaken when you don't eat. This can create circulation problems and an irregular heartbeat. Blood pressure can get very low during starvation and make you feel dizzy when you stand up.  The Stomach: Your stomach becomes smaller when you don't eat so when you start eating again, your stomach is likely to feel uncomfortable (you may have stomach aches and/or gas). Also, your stomach will not empty as fast making you feel full longer.  The Intestines: Your intestines will move food slowly often resulting in constipation (trouble having a bowel movement) and/or stomach aches or cramps when you eat meals.  The Brain: Your brain, which controls the rest of your body's functions, does not work properly without food. For example, you may have trouble thinking clearly or paying attention and/or you could also feel anxious or sad.  Body Cells: The balance of electrolytes in the blood can be changed with malnutrition or with purging. Without food, the amount of potassium and phosphorous can get dangerously low which can cause problems with your muscles, changes in your brain functioning, and cause life-threatening heart and rhythm problems.  Bones: When you don't eat, your bones often become weak due to low calcium and low hormone levels, which increases your risk of getting broken bones now and developing weak bones when you're older.  Body Temperature: Your body naturally lowers its temperature in times of starvation to conserve energy and protect vital organs. When this happens, there is a decrease in circulation (blood flow) to your fingers and toes which will often cause your hands and feet to feel  cold and look bluish.  Skin: Your skin becomes dry when your body is not well hydrated and when it does not get enough vitamins and minerals from food. The skin will naturally protect your body during periods of starvation by developing fine, soft hair called "lanugo" that covers the skin to keep your body warm.  Hair: When your hair doesn't get enough nourishment from the vitamins and minerals that are naturally found in healthy food, it becomes dry, thin and it can even fall out.  Nails: Your nails require nutrients in the form of vitamins and minerals from your diet. When you don't eat, you deny your body what it needs and your nails become dry and brittle and break easily.  Teeth: Your teeth need vitamin D and calcium from food sources. Without both of these minerals,  you can end up with dental problems such as tooth decay and gum disease. Purging can also destroy tooth enamel.  Learning Style & Readiness for Change Teaching method utilized: Visual & Auditory  Demonstrated degree of understanding via: Teach Back  Readiness Level: contemplative Barriers to learning/adherence to lifestyle change: fear of weight gain  RD's Notes for Next Visit Assess adherence to pt chosen goals     MONITORING & EVALUATION Dietary intake, weekly physical activity, body weight  Next Steps Patient is to follow-up in 3 months

## 2022-05-25 ENCOUNTER — Other Ambulatory Visit: Payer: Self-pay | Admitting: Gastroenterology

## 2022-06-11 ENCOUNTER — Ambulatory Visit
Admission: EM | Admit: 2022-06-11 | Discharge: 2022-06-11 | Disposition: A | Discharge status: Home / Self Care | Payer: 59 | Attending: Emergency Medicine | Admitting: Emergency Medicine

## 2022-06-11 ENCOUNTER — Ambulatory Visit (INDEPENDENT_AMBULATORY_CARE_PROVIDER_SITE_OTHER): Payer: 59

## 2022-06-11 ENCOUNTER — Encounter: Payer: Self-pay | Admitting: Emergency Medicine

## 2022-06-11 DIAGNOSIS — R0782 Intercostal pain: Secondary | ICD-10-CM | POA: Diagnosis not present

## 2022-06-11 DIAGNOSIS — R0781 Pleurodynia: Secondary | ICD-10-CM

## 2022-06-11 MED ORDER — HYDROCODONE-ACETAMINOPHEN 5-325 MG PO TABS: 1.0000 | ORAL_TABLET | Freq: Four times a day (QID) | ORAL | 0 refills | Status: DC | PRN

## 2022-06-11 NOTE — ED Provider Notes (Signed)
HPI  SUBJECTIVE:  [name] is a 46 y.o. female who presents with left anterior lower rib pain starting 2 days while forcing a box into her car with her arms.  She states that she felt a pop in the left anterior chest followed by immediate severe, constant, sharp, sore pain.  no direct trauma to the chest.  No bruising, swelling, cough, wheeze, shortness of breath, abdominal pain, fevers, abdominal distention.  She has not been able to sleep in 2 days due to the pain.  She tried 1 tablet of hydrocodone with improvement in her symptoms.  She has also tried Tylenol, heat, topical pain patches, Zanaflex, and putting pillows in front of her chest.  Symptoms are worse with deep inspiration, bending forward, coughing, sneezing, palpation.  She has a history of asthma, GERD, is status post gastric bypass, 5 back surgeries and hysterectomy.  PCP: [facility].   Past Medical History:  Diagnosis Date   Anemia    Low iron   Anxiety    Panic Disorder   Arthritis    back and knees, left elbow   Asthma    long time since asthma attack   Bronchitis    Chronic   Cough    hx of bronchitis   COVID-19 03/2020   GERD (gastroesophageal reflux disease)    Migraines    allergies   Motion sickness    Neuromuscular disorder (HCC)    numbness Right leg   RLS (restless legs syndrome)     Past Surgical History:  Procedure Laterality Date   ABDOMINAL HYSTERECTOMY     2004   ACHILLES TENDON SURGERY Right 05/14/2019   Procedure: ACHILLES REPAIR-SECONDARY;  Surgeon: Recardo Evangelistroxler, Matthew, DPM;  [facility];  Service: Podiatry;  Laterality: Right;  lma experel   BACK SURGERY  07/10/2006   L4-L5    BACK SURGERY  2010   CALCANEAL OSTEOTOMY Right 05/14/2019   Procedure: HAGLUNDS/RETROCALCANEAL;  Surgeon: Recardo Evangelistroxler, Matthew, DPM;  [facility];  Service: Podiatry;  Laterality: Right;   CARPAL TUNNEL RELEASE     right along with trigger finger    COLONOSCOPY WITH  PROPOFOL N/A 10/24/2020   Procedure: COLONOSCOPY WITH PROPOFOL;  Surgeon: Midge MiniumWohl, Darren, MD;  [facility];  Service: Endoscopy;  Laterality: N/A;   ESOPHAGOGASTRODUODENOSCOPY N/A 10/03/2017   Procedure: ESOPHAGOGASTRODUODENOSCOPY (EGD) WITH POSSIBLE BIOPSY;  Surgeon: Gaynelle AduWilson, Eric, MD;  [facility];  Service: General;  Laterality: N/A;   ESOPHAGOGASTRODUODENOSCOPY (EGD) WITH PROPOFOL N/A 05/17/2016   Procedure: ESOPHAGOGASTRODUODENOSCOPY (EGD) WITH PROPOFOL;  Surgeon: Midge Miniumarren Wohl, MD;  [facility];  Service: Endoscopy;  Laterality: N/A;   ESOPHAGOGASTRODUODENOSCOPY (EGD) WITH PROPOFOL N/A 11/29/2017   Procedure: ESOPHAGOGASTRODUODENOSCOPY (EGD) WITH PROPOFOL;  Surgeon: Midge MiniumWohl, Darren, MD;  [facility];  Service: Endoscopy;  Laterality: N/A;   ESOPHAGOGASTRODUODENOSCOPY (EGD) WITH PROPOFOL N/A 10/24/2020   Procedure: ESOPHAGOGASTRODUODENOSCOPY (EGD) WITH BIOPSY;  Surgeon: Midge MiniumWohl, Darren, MD;  [facility];  Service: Endoscopy;  Laterality: N/A;   ETHMOIDECTOMY Bilateral 04/07/2015   Procedure:  TOTAL ETHMOIDECTOMIES;  Surgeon: Vernie MurdersPaul Juengel, MD;  [facility];  Service: ENT;  Laterality: Bilateral;   FRONTAL SINUS EXPLORATION Bilateral 04/07/2015   Procedure: FRONTAL SINUSOTOMIES;  Surgeon: Vernie MurdersPaul Juengel, MD;  [facility];  Service: ENT;  Laterality: Bilateral;   IMAGE GUIDED SINUS SURGERY N/A 04/07/2015   Procedure: IMAGE GUIDED SINUS SURGERY;  Surgeon: Vernie MurdersPaul Juengel, MD;  [facility];  Service: ENT;  Laterality: N/A;  GAVE DISK TO BRENDA   LAPAROSCOPIC ROUX-EN-Y GASTRIC BYPASS WITH HIATAL HERNIA REPAIR N/A 07/09/2017   Procedure: LAPAROSCOPIC ROUX-EN-Y GASTRIC BYPASS WITH HIATAL HERNIA REPAIR UPPER ENDO;  Surgeon: Gaynelle Adu, MD;  Location: [facility];  Service: General;  Laterality: N/A;   LIPECTOMY Bilateral 02/15/2020   THIGHS    MASTOPEXY Bilateral 10/13/2019   Procedure: BILATERAL  MASTOPEXY;  Surgeon: Glenna Fellows, MD;  Location: [facility];  Service: Plastics;  Laterality: Bilateral;   MAXILLARY ANTROSTOMY Bilateral 04/07/2015   Procedure: MAXILLARY ANTROSTOMIES;  Surgeon: Vernie Murders, MD;  [facility];  Service: ENT;  Laterality: Bilateral;   MYELOGRAM     PANNICULECTOMY Bilateral 10/13/2019   Procedure: ABDOMINAL PANNICULECTOMY;  Surgeon: Glenna Fellows, MD;  Location: [facility];  Service: Plastics;  Laterality: Bilateral;   SEPTOPLASTY N/A 04/07/2015   Procedure: SEPTOPLASTY;  Surgeon: Vernie Murders, MD;  [facility];  Service: ENT;  Laterality: N/A;   TONSILLECTOMY     ULNAR NERVE TRANSPOSITION     UPPER GI ENDOSCOPY  07/09/2017   Procedure: UPPER GI ENDOSCOPY;  Surgeon: Gaynelle Adu, MD;  Location: [facility];  Service: General;;    Family History  Problem Relation Age of Onset   Ovarian cancer Sister 64   Breast cancer Neg Hx     Social History   Tobacco Use   Smoking status: Every Day    Packs/day: 0.25    Years: 20.00    Total pack years: 5.00    Types: Cigarettes    Last attempt to quit: 07/09/2017    Years since quitting: 4.9   Smokeless tobacco: Never  Vaping Use   Vaping Use: Every day  Substance Use Topics   Alcohol use: No   Drug use: No    No current facility-administered medications for this encounter.  Current Outpatient Medications:    HYDROcodone-acetaminophen (NORCO/VICODIN) 5-325 MG tablet, Take 1-2 tablets by mouth every 6 (six) hours as needed for moderate pain or severe pain., Disp: 12 tablet, Rfl: 0   ZANAFLEX 6 MG capsule, Take by mouth., Disp: , Rfl:    albuterol (PROVENTIL HFA;VENTOLIN HFA) 108 (90 BASE) MCG/ACT inhaler, Inhale 2 puffs into the lungs every 6 (six) hours as needed for wheezing or shortness of breath. , Disp: , Rfl:    Ca Phosphate-Cholecalciferol (CALCIUM/VITAMIN D3 GUMMIES PO), Take 1 each by mouth 3 (three) times daily., Disp: , Rfl:     melatonin 5 MG TABS, Take 10 mg by mouth at bedtime., Disp: , Rfl:    montelukast (SINGULAIR) 10 MG tablet, Take 10 mg by mouth daily., Disp: , Rfl:    Multiple Vitamins-Minerals (MULTIVITAMIN PO), Take 1 tablet by mouth 2 (two) times daily., Disp: , Rfl:    ondansetron (ZOFRAN-ODT) 4 MG disintegrating tablet, Take 4 mg by mouth 3 (three) times daily as needed for nausea or vomiting. , Disp: , Rfl:    pantoprazole (PROTONIX) 40 MG tablet, Take 1 tablet (40 mg total) by mouth 2 (two) times daily. MUST SCHEDULE OFFICE VISIT, Disp: 60 tablet, Rfl: 0   rOPINIRole (REQUIP XL) 2 MG 24 hr tablet, Take 2 mg by mouth daily., Disp: , Rfl:    Sodium Sulfate-Mag Sulfate-KCl (SUTAB) [phone] MG TABS, Take 376 mg by mouth as directed., Disp: 24 tablet, Rfl: 0  Allergies  Allergen Reactions   Penicillins Anaphylaxis and Other (See Comments)    Has patient had a PCN reaction causing immediate rash, facial/tongue/throat swelling, SOB  or lightheadedness with hypotension: Yes Has patient had a PCN reaction causing severe rash involving mucus membranes or skin necrosis: Yes Has patient had a PCN reaction that required hospitalization: Yes Has patient had a PCN reaction occurring within the last 10 years: No If all of the above answers are "NO", then may proceed with Cephalosporin use.    Oxycodone Itching, Nausea Only and Other (See Comments)    "bugs crawling on me". Can take if takes Benadryl first    Prednisone Other (See Comments)    Insomnia,  Unable to sleep    Quinolones Other (See Comments)    BLURRY VISION   Chlorhexidine Rash    Abdominal rash   Latex Rash and Other (See Comments)    Latex adhesives tape only per pt.  Not allergic to latex gloves or other products, USE PAPER TAPE ONLY PLEASE     ROS  As noted in HPI.   Physical Exam  BP 106/61 (BP Location: Right Arm)   Pulse (!) 57   Temp 97.8 F (36.6 C) (Oral)   Resp 16   SpO2 99%   Constitutional: Well developed, well  nourished, no acute distress Eyes:  EOMI, conjunctiva normal bilaterally HENT: Normocephalic, atraumatic,mucus membranes moist Respiratory: Limited inspiratory effort due to pain.  Lungs clear bilaterally. Chest: Positive tenderness along the anterior lower ribs, especially underneath the breast.  No bruising, swelling.  Positive mild tenderness along the xiphoid process.  No sternal tenderness.  No other rib tenderness. Cardiovascular: Normal rate GI: nondistended soft, nontender skin: No rash, skin intact Musculoskeletal: no deformities Neurologic: Alert & oriented x 3, no focal neuro deficits Psychiatric: Speech and behavior appropriate   ED Course   Medications - No data to display  Orders Placed This Encounter  Procedures   DG Ribs Unilateral W/Chest Left    Standing Status:   Standing    Number of Occurrences:   1    Order Specific Question:   Reason for Exam (SYMPTOM  OR DIAGNOSIS REQUIRED)    Answer:   pop left lower anterior ribs with immediate pain.  Positive tenderness along the xiphoid process, left lower ribs underneath the breast.  Rule out fracture, effusion, pneumothorax    No results found for this or any previous visit (from the past 24 hour(s)). DG Ribs Unilateral W/Chest Left  Result Date: 06/11/2022 CLINICAL DATA:  Rib popping.  Tenderness EXAM: LEFT RIBS AND CHEST - 3+ VIEW COMPARISON:  04/04/2017 FINDINGS: No fracture or other bone lesions are seen involving the ribs. There is no evidence of pneumothorax or pleural effusion. Both lungs are clear. Heart size and mediastinal contours are within normal limits. Postsurgical change related to bariatric surgery. IMPRESSION: No evidence of rib fracture.  No acute cardiopulmonary findings. Electronically Signed   By: Genevive Bi M.D.   On: 06/11/2022 17:01    ED Clinical Impression  1. Rib pain on left side      ED Assessment/Plan     Concern for left rib fracture.  Doubt splenic injury.  Will check rib  series.  Discussed with patient that this is not sensitive for picking up nondisplaced rib fractures, but that we could look for displaced rib fractures, pneumothorax, pleural effusion..   [place] Narcotic database reviewed for this patient, and feel that the risk/benefit ratio today is favorable for proceeding with a prescription for controlled substance.  Last hydrocodone prescription was back in February 2023  Reviewed imaging independently.  No displaced rib fractures, pneumothorax, pleural  effusion.  See radiology report for full details.  Presentation concerning for nondisplaced rib fractures versus costochondral strain.  home with a Tylenol containing product 3 times a day.  Either regular Tylenol for mild to moderate or Norco for severe pain.  Ice or heat, whichever feels better.  She has an incentive spirometer at home and knows how to use it.  Follow-up with her PCP as needed.  Discussed with patient that if she starts having fevers, shortness of breath, hemoptysis, she needs to go to the emergency department for advanced imaging.  Discussed imaging, MDM, treatment plan, and plan for follow-up with patient. Discussed sn/sx that should prompt return to the ED. patient agrees with plan.   Meds ordered this encounter  Medications   HYDROcodone-acetaminophen (NORCO/VICODIN) 5-325 MG tablet    Sig: Take 1-2 tablets by mouth every 6 (six) hours as needed for moderate pain or severe pain.    Dispense:  12 tablet    Refill:  0      *This clinic note was created using Scientist, clinical (histocompatibility and immunogenetics). Therefore, there may be occasional mistakes despite careful proofreading.  ?    Domenick Gong, MD 06/12/22 1447

## 2022-06-11 NOTE — ED Triage Notes (Signed)
Pt was pushing a box into her car 2 days ago and heard a pop. She has left side rib cage pain that radiates to her back.

## 2022-06-11 NOTE — Discharge Instructions (Addendum)
X-ray was negative for displaced rib fracture, collapsed lung or fluid in your lung.  However, I suspect that you have several fractured ribs.  The x-ray is not very good and picking up nondisplaced fractures.  Using incentive spirometer several times an answer.  Take a Tylenol containing product 3-4 times a day as needed for pain.  Either 1000 mg of Tylenol for mild to moderate pain or 1-2 Norco for severe pain.  This will take several weeks to heal.  Go to the ER for fevers above 100.4, shortness of breath, coughing up blood, or other concerns.  Do not take diazepam if you are taking the Norco.  Discontinue the muscle relaxants if they are not working for you.

## 2022-06-12 ENCOUNTER — Other Ambulatory Visit: Payer: Self-pay | Admitting: Gastroenterology

## 2022-07-18 ENCOUNTER — Ambulatory Visit: Payer: 59 | Admitting: Skilled Nursing Facility1

## 2022-07-27 ENCOUNTER — Other Ambulatory Visit: Payer: Self-pay | Admitting: Internal Medicine

## 2022-07-27 DIAGNOSIS — Z1231 Encounter for screening mammogram for malignant neoplasm of breast: Secondary | ICD-10-CM

## 2022-07-31 ENCOUNTER — Ambulatory Visit: Payer: 59 | Admitting: Skilled Nursing Facility1

## 2022-08-06 ENCOUNTER — Ambulatory Visit
Admission: RE | Admit: 2022-08-06 | Discharge: 2022-08-06 | Disposition: A | Discharge status: Home / Self Care | Payer: 59 | Source: Ambulatory Visit | Attending: Internal Medicine | Admitting: Internal Medicine

## 2022-08-06 DIAGNOSIS — Z1231 Encounter for screening mammogram for malignant neoplasm of breast: Secondary | ICD-10-CM | POA: Diagnosis present

## 2022-08-07 ENCOUNTER — Other Ambulatory Visit: Payer: Self-pay | Admitting: Internal Medicine

## 2022-08-07 DIAGNOSIS — N63 Unspecified lump in unspecified breast: Secondary | ICD-10-CM

## 2022-08-07 DIAGNOSIS — R928 Other abnormal and inconclusive findings on diagnostic imaging of breast: Secondary | ICD-10-CM

## 2022-08-15 ENCOUNTER — Other Ambulatory Visit: Payer: 59

## 2022-08-16 ENCOUNTER — Ambulatory Visit: Admit: 2022-08-16 | Payer: 59

## 2022-08-17 ENCOUNTER — Ambulatory Visit: Payer: 59

## 2022-08-24 ENCOUNTER — Ambulatory Visit
Admission: RE | Admit: 2022-08-24 | Discharge: 2022-08-24 | Disposition: A | Discharge status: Home / Self Care | Payer: 59 | Source: Ambulatory Visit | Attending: Internal Medicine | Admitting: Internal Medicine

## 2022-08-24 DIAGNOSIS — R928 Other abnormal and inconclusive findings on diagnostic imaging of breast: Secondary | ICD-10-CM

## 2022-08-24 DIAGNOSIS — N63 Unspecified lump in unspecified breast: Secondary | ICD-10-CM

## 2022-12-05 ENCOUNTER — Emergency Department
Admission: EM | Admit: 2022-12-05 | Discharge: 2022-12-05 | Disposition: A | Discharge status: Home / Self Care | Payer: 59 | Attending: Emergency Medicine | Admitting: Emergency Medicine

## 2022-12-05 ENCOUNTER — Other Ambulatory Visit: Payer: Self-pay

## 2022-12-05 ENCOUNTER — Emergency Department: Payer: 59

## 2022-12-05 DIAGNOSIS — W260XXA Contact with knife, initial encounter: Secondary | ICD-10-CM | POA: Diagnosis not present

## 2022-12-05 DIAGNOSIS — Z8616 Personal history of COVID-19: Secondary | ICD-10-CM | POA: Insufficient documentation

## 2022-12-05 DIAGNOSIS — Z91040 Latex allergy status: Secondary | ICD-10-CM | POA: Diagnosis not present

## 2022-12-05 DIAGNOSIS — E119 Type 2 diabetes mellitus without complications: Secondary | ICD-10-CM | POA: Diagnosis not present

## 2022-12-05 DIAGNOSIS — Y93G1 Activity, food preparation and clean up: Secondary | ICD-10-CM | POA: Diagnosis not present

## 2022-12-05 DIAGNOSIS — S61210A Laceration without foreign body of right index finger without damage to nail, initial encounter: Secondary | ICD-10-CM | POA: Diagnosis present

## 2022-12-05 DIAGNOSIS — J45909 Unspecified asthma, uncomplicated: Secondary | ICD-10-CM | POA: Insufficient documentation

## 2022-12-05 MED ORDER — DOXYCYCLINE HYCLATE 50 MG PO CAPS: 100.0000 mg | ORAL_CAPSULE | Freq: Two times a day (BID) | ORAL | 0 refills | Status: DC

## 2022-12-05 MED ORDER — LIDOCAINE HCL (PF) 1 % IJ SOLN
INTRAMUSCULAR | Status: AC
  Filled 2022-12-05: qty 5

## 2022-12-05 MED ORDER — CLINDAMYCIN HCL 300 MG PO CAPS: 300.0000 mg | ORAL_CAPSULE | Freq: Three times a day (TID) | ORAL | 0 refills | Status: AC

## 2022-12-05 MED ORDER — DOXYCYCLINE HYCLATE 100 MG PO TABS
100.0000 mg | ORAL_TABLET | Freq: Once | ORAL | Status: DC
  Filled 2022-12-05: qty 1

## 2022-12-05 MED ORDER — CLINDAMYCIN HCL 150 MG PO CAPS
300.0000 mg | ORAL_CAPSULE | Freq: Once | ORAL | Status: AC
  Administered 2022-12-05: 300 mg via ORAL
  Filled 2022-12-05: qty 2

## 2022-12-05 MED ORDER — BACITRACIN ZINC 500 UNIT/GM EX OINT
TOPICAL_OINTMENT | Freq: Once | CUTANEOUS | Status: AC
  Administered 2022-12-05: 1 via TOPICAL
  Filled 2022-12-05: qty 0.9

## 2022-12-05 NOTE — Discharge Instructions (Addendum)
1.  Suture removal in 7 to 10 days.  Keep wound clean and dry. 2.  Take antibiotic as prescribed until finished. 3.  Return to the ER for worsening symptoms, increased redness/swelling, purulent discharge or other concerns.

## 2022-12-05 NOTE — ED Notes (Signed)
Started to give the pt the doxycline that was ordered for her until the pt stated that she was allergic to it.  Medication not given, and the medication was added to the pt's allergy list.  Dr. Dolores Frame made aware and she ordered a new antibiotic. Clindamycin was given per order.  Bandage was placed with ointment, per order on 2nd digit of R hand.

## 2022-12-05 NOTE — ED Triage Notes (Signed)
Pt presents to ER with c/o right pointer finger lac that happened appx 10 min pta.  Pt states she was slicing sweet potatoes when she accidentally cut her finger with a kitchen knife.  Lac to middle finger is appx 1-2 cm, going laterally across finger.  Finger wrapped in gauze on arrival.  Blood still oozing from wound.  Pt is otherwise A&O x4 and in NAD.

## 2022-12-05 NOTE — ED Provider Notes (Signed)
[facility] Provider Note    Event Date/Time   First MD Initiated Contact with Patient 12/05/22 0301     (approximate)   History   Extremity Laceration   HPI  [name] is a 47 y.o. female who presents to the ED from home with a chief complaint of right finger laceration.  Patient was slicing sweet potatoes when she accidentally cut her finger with a kitchen knife.  This is up-to-date.  Presents with right index finger laceration still oozing blood.  Patient denies anticoagulant use.     Past Medical History   Past Medical History:  Diagnosis Date   Anemia    Low iron   Anxiety    Panic Disorder   Arthritis    back and knees, left elbow   Asthma    long time since asthma attack   Bronchitis    Chronic   Cough    hx of bronchitis   COVID-19 03/2020   GERD (gastroesophageal reflux disease)    Migraines    allergies   Motion sickness    Neuromuscular disorder (HCC)    numbness Right leg   RLS (restless legs syndrome)      Active Problem List   Patient Active Problem List   Diagnosis Date Noted   Barrett's esophagus without dysplasia    Colon cancer screening    Panniculitis 10/13/2019   Bilateral carpal tunnel syndrome 05/21/2018   Abdominal pain, epigastric    Status post bariatric surgery    Dyslipidemia 07/09/2017   Fatty liver disease, nonalcoholic 07/09/2017   Morbid obesity (HCC) 07/09/2017   Migraine without aura and without status migrainosus, not intractable 09/28/2016   Dyspareunia 05/28/2016   Heartburn    Gastritis without bleeding    Obesity (BMI 30-39.9) 04/09/2016   Increased band cell count 03/14/2016   Diet-controlled type 2 diabetes mellitus (HCC) 03/14/2016   Barrett's esophagus with dysplasia 09/15/2014   Trigger thumb of right hand 10/02/2012   Neuromuscular disorder (HCC) 06/16/2012   Obesity 06/16/2012   Numbness and tingling in right hand 03/11/2012   Numbness 01/24/2012   Asthma 11/23/2011    IRRITABLE BOWEL SYNDROME 09/05/2009   ANXIETY 08/02/2009   PANIC DISORDER 08/02/2009   GERD 08/02/2009   Hiatal hernia with GERD 08/02/2009   DEGENERATIVE JOINT DISEASE 08/02/2009   HEADACHE, CHRONIC 08/02/2009   REFLUX ESOPHAGITIS, HX OF 08/02/2009     Past Surgical History   Past Surgical History:  Procedure Laterality Date   ABDOMINAL HYSTERECTOMY     2004   ACHILLES TENDON SURGERY Right 05/14/2019   Procedure: ACHILLES REPAIR-SECONDARY;  Surgeon: Recardo Evangelist, DPM;  Location: [facility];  Service: Podiatry;  Laterality: Right;  lma experel   BACK SURGERY  07/10/2006   L4-L5    BACK SURGERY  2010   CALCANEAL OSTEOTOMY Right 05/14/2019   Procedure: HAGLUNDS/RETROCALCANEAL;  Surgeon: Recardo Evangelist, DPM;  Location: [facility];  Service: Podiatry;  Laterality: Right;   CARPAL TUNNEL RELEASE     right along with trigger finger    COLONOSCOPY WITH PROPOFOL N/A 10/24/2020   Procedure: COLONOSCOPY WITH PROPOFOL;  Surgeon: Midge Minium, MD;  Location: [facility];  Service: Endoscopy;  Laterality: N/A;   ESOPHAGOGASTRODUODENOSCOPY N/A 10/03/2017   Procedure: ESOPHAGOGASTRODUODENOSCOPY (EGD) WITH POSSIBLE BIOPSY;  Surgeon: Gaynelle Adu, MD;  Location: [facility];  Service: General;  Laterality: N/A;   ESOPHAGOGASTRODUODENOSCOPY (EGD) WITH PROPOFOL N/A 05/17/2016   Procedure: ESOPHAGOGASTRODUODENOSCOPY (EGD) WITH PROPOFOL;  Surgeon:  Midge Minium, MD;  Location: [facility];  Service: Endoscopy;  Laterality: N/A;   ESOPHAGOGASTRODUODENOSCOPY (EGD) WITH PROPOFOL N/A 11/29/2017   Procedure: ESOPHAGOGASTRODUODENOSCOPY (EGD) WITH PROPOFOL;  Surgeon: Midge Minium, MD;  Location: [facility];  Service: Endoscopy;  Laterality: N/A;   ESOPHAGOGASTRODUODENOSCOPY (EGD) WITH PROPOFOL N/A 10/24/2020   Procedure: ESOPHAGOGASTRODUODENOSCOPY (EGD) WITH BIOPSY;  Surgeon: Midge Minium, MD;  Location: [facility];  Service: Endoscopy;  Laterality:  N/A;   ETHMOIDECTOMY Bilateral 04/07/2015   Procedure:  TOTAL ETHMOIDECTOMIES;  Surgeon: Vernie Murders, MD;  Location: [facility];  Service: ENT;  Laterality: Bilateral;   FRONTAL SINUS EXPLORATION Bilateral 04/07/2015   Procedure: FRONTAL SINUSOTOMIES;  Surgeon: Vernie Murders, MD;  Location: [facility];  Service: ENT;  Laterality: Bilateral;   IMAGE GUIDED SINUS SURGERY N/A 04/07/2015   Procedure: IMAGE GUIDED SINUS SURGERY;  Surgeon: Vernie Murders, MD;  Location: [facility];  Service: ENT;  Laterality: N/A;  GAVE DISK TO BRENDA   LAPAROSCOPIC ROUX-EN-Y GASTRIC BYPASS WITH HIATAL HERNIA REPAIR N/A 07/09/2017   Procedure: LAPAROSCOPIC ROUX-EN-Y GASTRIC BYPASS WITH HIATAL HERNIA REPAIR UPPER ENDO;  Surgeon: Gaynelle Adu, MD;  Location: [facility];  Service: General;  Laterality: N/A;   LIPECTOMY Bilateral 02/15/2020   THIGHS    MASTOPEXY Bilateral 10/13/2019   Procedure: BILATERAL MASTOPEXY;  Surgeon: Glenna Fellows, MD;  Location: [facility];  Service: Plastics;  Laterality: Bilateral;   MAXILLARY ANTROSTOMY Bilateral 04/07/2015   Procedure: MAXILLARY ANTROSTOMIES;  Surgeon: Vernie Murders, MD;  Location: [facility];  Service: ENT;  Laterality: Bilateral;   MYELOGRAM     PANNICULECTOMY Bilateral 10/13/2019   Procedure: ABDOMINAL PANNICULECTOMY;  Surgeon: Glenna Fellows, MD;  Location:  SURGERY CENTER;  Service: Plastics;  Laterality: Bilateral;   REDUCTION MAMMAPLASTY Bilateral 2022   SEPTOPLASTY N/A 04/07/2015   Procedure: SEPTOPLASTY;  Surgeon: Vernie Murders, MD;  Location: [facility];  Service: ENT;  Laterality: N/A;   TONSILLECTOMY     ULNAR NERVE TRANSPOSITION     UPPER GI ENDOSCOPY  07/09/2017   Procedure: UPPER GI ENDOSCOPY;  Surgeon: Gaynelle Adu, MD;  Location: [facility];  Service: General;;     Home Medications   Prior to Admission medications   Medication Sig Start Date End Date Taking? Authorizing Provider   clindamycin (CLEOCIN) 300 MG capsule Take 1 capsule (300 mg total) by mouth 3 (three) times daily. 12/05/22  Yes Irean Hong, MD  doxycycline (VIBRAMYCIN) 50 MG capsule Take 2 capsules (100 mg total) by mouth 2 (two) times daily. 12/05/22  Yes Irean Hong, MD  albuterol (PROVENTIL HFA;VENTOLIN HFA) 108 (90 BASE) MCG/ACT inhaler Inhale 2 puffs into the lungs every 6 (six) hours as needed for wheezing or shortness of breath.  08/19/14   [provider]  Ca Phosphate-Cholecalciferol (CALCIUM/VITAMIN D3 GUMMIES PO) Take 1 each by mouth 3 (three) times daily.    [provider]  HYDROcodone-acetaminophen (NORCO/VICODIN) 5-325 MG tablet Take 1-2 tablets by mouth every 6 (six) hours as needed for moderate pain or severe pain. 06/11/22   Domenick Gong, MD  melatonin 5 MG TABS Take 10 mg by mouth at bedtime.    [provider]  montelukast (SINGULAIR) 10 MG tablet Take 10 mg by mouth daily. 09/20/20   [provider]  Multiple Vitamins-Minerals (MULTIVITAMIN PO) Take 1 tablet by mouth 2 (two) times daily.    [provider]  ondansetron (ZOFRAN-ODT) 4 MG disintegrating tablet Take 4 mg by mouth 3 (  three) times daily as needed for nausea or vomiting.  09/10/17   [provider]  pantoprazole (PROTONIX) 40 MG tablet Take 1 tablet (40 mg total) by mouth 2 (two) times daily. MUST SCHEDULE OFFICE VISIT 01/01/22   Midge Minium, MD  rOPINIRole (REQUIP XL) 2 MG 24 hr tablet Take 2 mg by mouth daily. 09/21/20   [provider]  Sodium Sulfate-Mag Sulfate-KCl (SUTAB) 8064956469 MG TABS Take 376 mg by mouth as directed. 10/11/20   Midge Minium, MD  ZANAFLEX 6 MG capsule Take by mouth. 05/29/22   [provider]     Allergies  Doxycycline, Penicillins, Prednisone, Quinolones, Chlorhexidine, and Latex   Family History   Family History  Problem Relation Age of Onset   Ovarian cancer Sister 72   Breast cancer Neg Hx      Physical Exam   Triage Vital Signs: ED Triage Vitals  Enc Vitals Group     BP 12/05/22 0107 131/84     Pulse Rate 12/05/22 0107 93     Resp 12/05/22 0107 19     Temp 12/05/22 0107 98.7 F (37.1 C)     Temp Source 12/05/22 0107 Oral     SpO2 12/05/22 0107 96 %     Weight 12/05/22 0108 115 lb (52.2 kg)     Height 12/05/22 0108 5\' 7"  (1.702 m)     Head Circumference --      Peak Flow --      Pain Score 12/05/22 0108 9     Pain Loc --      Pain Edu? --      Excl. in GC? --     Updated Vital Signs: BP 131/84 (BP Location: Left Arm)   Pulse 93   Temp 98.7 F (37.1 C) (Oral)   Resp 19   Ht 5\' 7"  (1.702 m)   Wt 52.2 kg   SpO2 96%   BMI 18.01 kg/m    General: Awake, no distress. Anxious. CV:  Good peripheral perfusion.  Resp:  Normal effort.  Abd:  No distention.  Other:  Right index finger palmar surface: Approximately 1cm horizontally linear deep laceration proximal to IP joint with active venous bleeding.  2+ radial pulse.  Brisk, less than 5-second capillary refill.   ED Results / Procedures / Treatments  Labs (all labs ordered are listed, but only abnormal results are displayed) Labs Reviewed - No data to display   EKG  None   RADIOLOGY I have independently visualized and interpreted patient's x-ray as well as noted the radiology interpretation:  Right finger x-ray: Negative  Official radiology report(s): DG Finger Index Right  Result Date: 12/05/2022 CLINICAL DATA:  Finger laceration EXAM: RIGHT INDEX FINGER 2+V COMPARISON:  None Available. FINDINGS: There is no evidence of fracture or dislocation. There is no evidence of arthropathy or other focal bone abnormality. No radiopaque foreign body. IMPRESSION: Negative. Electronically Signed   By: Deatra Robinson M.D.   On: 12/05/2022 02:07     PROCEDURES:  Critical Care performed: No  ..Laceration Repair  Date/Time: 12/05/2022 3:40 AM  Performed by: Irean Hong, MD Authorized by: Irean Hong, MD   Consent:    Consent  obtained:  Verbal   Consent given by:  Patient   Risks, benefits, and alternatives were discussed: yes     Risks discussed:  Infection, pain, need for additional repair, poor cosmetic result, tendon damage, nerve damage, poor wound healing and vascular damage   Alternatives discussed: Nonsutured  repair. Anesthesia:    Anesthesia method:  Local infiltration   Local anesthetic:  Lidocaine 1% w/o epi Laceration details:    Location:  Finger   Finger location:  R index finger   Length (cm):  1   Depth (mm):  4 Pre-procedure details:    Preparation:  Patient was prepped and draped in usual sterile fashion and imaging obtained to evaluate for foreign bodies Treatment:    Area cleansed with:  Povidone-iodine and saline   Amount of cleaning:  Standard   Irrigation method:  Syringe   Visualized foreign bodies/material removed: no   Skin repair:    Repair method:  Sutures   Suture size:  4-0   Suture material:  Prolene   Suture technique:  Simple interrupted   Number of sutures:  3 Approximation:    Approximation:  Loose Repair type:    Repair type:  Simple Post-procedure details:    Dressing:  Antibiotic ointment and non-adherent dressing   Procedure completion:  Tolerated well, no immediate complications Comments:     Hemostasis achieved    MEDICATIONS ORDERED IN ED: Medications  lidocaine (PF) (XYLOCAINE) 1 % injection (  Given 12/05/22 0335)  bacitracin ointment (1 Application Topical Given 12/05/22 0342)  clindamycin (CLEOCIN) capsule 300 mg (300 mg Oral Given 12/05/22 0351)     IMPRESSION / MDM / ASSESSMENT AND PLAN / ED COURSE  I reviewed the triage vital signs and the nursing notes.                             47 year old female presenting with finger laceration.  X-ray negative for foreign body.  Finger was numbed and sutured.  Bacitracin and nonadherent dressing applied.  Patient tolerated procedure well.  Will place on Clindamycin (patient is penicillin and doxycycline  allergic) prophylactically to prevent hand infection.  Strict return precautions given.  Patient verbalizes understanding and agrees with plan of care.  Patient's presentation is most consistent with acute, uncomplicated illness.   FINAL CLINICAL IMPRESSION(S) / ED DIAGNOSES   Final diagnoses:  Laceration of right index finger without foreign body without damage to nail, initial encounter     Rx / DC Orders   ED Discharge Orders          Ordered    doxycycline (VIBRAMYCIN) 50 MG capsule  2 times daily        12/05/22 0333    clindamycin (CLEOCIN) 300 MG capsule  3 times daily        12/05/22 0348             Note:  This document was prepared using Dragon voice recognition software and may include unintentional dictation errors.   Irean Hong, MD 12/05/22 [phone]

## 2022-12-12 ENCOUNTER — Telehealth: Payer: Self-pay

## 2022-12-12 NOTE — Telephone Encounter (Signed)
Transition Care Management Follow-up Telephone Call Date of discharge and from where: [place] 5/8 How have you been since you were released from the hospital? Had stiches out 5/15 at PCP this moring  Any questions or concerns? Yes  Items Reviewed: Did the pt receive and understand the discharge instructions provided? Yes  Medications obtained and verified? Yes  Other? No  Any new allergies since your discharge? No  Dietary orders reviewed? No Do you have support at home? Yes    Follow up appointments reviewed:  PCP Hospital f/u appt confirmed? Yes  Scheduled to see PCP on 5/15 @ . Specialist Hospital f/u appt confirmed?  Scheduled to see  on  @ . Are transportation arrangements needed? No  If their condition worsens, is the pt aware to call PCP or go to the Emergency Dept.? Yes Was the patient provided with contact information for the PCP's office or ED? Yes Was to pt encouraged to call back with questions or concerns? Yes

## 2023-09-02 ENCOUNTER — Other Ambulatory Visit: Payer: Self-pay | Admitting: Family Medicine

## 2023-09-02 DIAGNOSIS — R918 Other nonspecific abnormal finding of lung field: Secondary | ICD-10-CM

## 2023-09-10 ENCOUNTER — Ambulatory Visit
Admission: RE | Admit: 2023-09-10 | Discharge: 2023-09-10 | Disposition: A | Discharge status: Home / Self Care | Payer: 59 | Source: Ambulatory Visit | Attending: Family Medicine | Admitting: Family Medicine

## 2023-09-10 DIAGNOSIS — R918 Other nonspecific abnormal finding of lung field: Secondary | ICD-10-CM | POA: Insufficient documentation

## 2023-09-24 ENCOUNTER — Ambulatory Visit (INDEPENDENT_AMBULATORY_CARE_PROVIDER_SITE_OTHER): Payer: 59 | Admitting: Gastroenterology

## 2023-09-24 VITALS — BP 118/80 | HR 83 | Temp 98.3°F | Ht 67.0 in | Wt 125.0 lb

## 2023-09-24 DIAGNOSIS — K219 Gastro-esophageal reflux disease without esophagitis: Secondary | ICD-10-CM | POA: Diagnosis not present

## 2023-09-24 DIAGNOSIS — K2100 Gastro-esophageal reflux disease with esophagitis, without bleeding: Secondary | ICD-10-CM

## 2023-09-24 MED ORDER — PANTOPRAZOLE SODIUM 20 MG PO TBEC: 20.0000 mg | DELAYED_RELEASE_TABLET | Freq: Every day | ORAL | 11 refills | Status: AC

## 2023-09-24 NOTE — Progress Notes (Signed)
 Wyline Mood MD, MRCP(U.K) [address]  [address]  [place], [place] [address]  Main: [phone]  Fax: [phone]   Primary Care Physician: [facility]  Primary Gastroenterologist:  Dr. Wyline Mood   Chief Complaint  Patient presents with   Follow-up    HPI: [name] is a 48 y.o. female who has previously seen Dr. Servando Snare back in 2022.  History of gastric bypass.  Underwent an EGD in March 2022 to screen for Barrett's esophagus was found to have a gastric bypass and mucosa suggestive of Barrett's esophagus biopsies were taken and no evidence of Barrett's esophagus was seen features of reflux esophagitis was noted.  On the same day underwent a colonoscopy for  screening was normal repeat a suggested in 10 years.  She wanted to know if she had Barrett's esophagus I reviewed the results of her endoscopy in 2022 that showed she had reflux esophagitis by the biopsy although the endoscopy appearance appeared like a tongue of short segment Barrett's.  I explained that for reflux esophagitis we do not perform surveillance.  She is on 40 mg of pantoprazole.  Current Outpatient Medications  Medication Sig Dispense Refill   albuterol (PROVENTIL HFA;VENTOLIN HFA) 108 (90 BASE) MCG/ACT inhaler Inhale 2 puffs into the lungs every 6 (six) hours as needed for wheezing or shortness of breath.      Ca Phosphate-Cholecalciferol (CALCIUM/VITAMIN D3 GUMMIES PO) Take 1 each by mouth 3 (three) times daily.     clindamycin (CLEOCIN) 300 MG capsule Take 1 capsule (300 mg total) by mouth 3 (three) times daily. 30 capsule 0   doxycycline (VIBRAMYCIN) 50 MG capsule Take 2 capsules (100 mg total) by mouth 2 (two) times daily. 28 capsule 0   HYDROcodone-acetaminophen (NORCO/VICODIN) 5-325 MG tablet Take 1-2 tablets by mouth every 6 (six) hours as needed for moderate pain or severe pain. 12 tablet 0   melatonin 5 MG TABS Take 10 mg by mouth at bedtime.     montelukast (SINGULAIR) 10 MG  tablet Take 10 mg by mouth daily.     Multiple Vitamins-Minerals (MULTIVITAMIN PO) Take 1 tablet by mouth 2 (two) times daily.     ondansetron (ZOFRAN-ODT) 4 MG disintegrating tablet Take 4 mg by mouth 3 (three) times daily as needed for nausea or vomiting.      pantoprazole (PROTONIX) 40 MG tablet Take 1 tablet (40 mg total) by mouth 2 (two) times daily. MUST SCHEDULE OFFICE VISIT 60 tablet 0   rOPINIRole (REQUIP XL) 2 MG 24 hr tablet Take 2 mg by mouth daily.     Sodium Sulfate-Mag Sulfate-KCl (SUTAB) [phone] MG TABS Take 376 mg by mouth as directed. 24 tablet 0   ZANAFLEX 6 MG capsule Take by mouth.     No current facility-administered medications for this visit.    Allergies as of 09/24/2023 - Review Complete 09/24/2023  Allergen Reaction Noted   Doxycycline Other (See Comments) 12/05/2022   Penicillins Anaphylaxis and Other (See Comments) 08/02/2009   Prednisone Other (See Comments) 12/31/2014   Quinolones Other (See Comments) 12/31/2014   Chlorhexidine Rash 02/15/2020   Latex Rash and Other (See Comments) 12/31/2014     ROS:  General: Negative for anorexia, weight loss, fever, chills, fatigue, weakness. ENT: Negative for hoarseness, difficulty swallowing , nasal congestion. CV: Negative for chest pain, angina, palpitations, dyspnea on exertion, peripheral edema.  Respiratory: Negative for dyspnea at rest, dyspnea on exertion, cough, sputum, wheezing.  GI: See history of present  illness. GU:  Negative for dysuria, hematuria, urinary incontinence, urinary frequency, nocturnal urination.  Endo: Negative for unusual weight change.    Physical Examination:   BP 118/80   Pulse 83   Temp 98.3 F (36.8 C) (Oral)   Ht 5\' 7"  (1.702 m)   Wt 125 lb (56.7 kg)   BMI 19.58 kg/m   General: Well-nourished, well-developed in no acute distress.  Eyes: No icterus. Conjunctivae pink. Mouth: Oropharyngeal mucosa moist and pink , no lesions erythema or exudate. Skin: Warm and dry,  no jaundice.   Psych: Alert and cooperative, normal mood and affect.   Imaging Studies: CT CHEST WO CONTRAST Result Date: 09/22/2023 CLINICAL DATA:  Follow-up ground-glass opacity EXAM: CT CHEST WITHOUT CONTRAST TECHNIQUE: Multidetector CT imaging of the chest was performed following the standard protocol without IV contrast. RADIATION DOSE REDUCTION: This exam was performed according to the departmental dose-optimization program which includes automated exposure control, adjustment of the mA and/or kV according to patient size and/or use of iterative reconstruction technique. COMPARISON:  Abdominal CT 10/13/2014 FINDINGS: Cardiovascular: Heart is normal size. Aorta is normal caliber. Mediastinum/Nodes: No mediastinal, hilar, or axillary adenopathy. Trachea and esophagus are unremarkable. Thyroid unremarkable. Lungs/Pleura: Biapical scarring. Ground-glass density in the right apex is felt to reflect a component of scarring. 4 mm left lower lobe pulmonary nodule is stable since prior study, measured on today's study on image 123. Previously seen left lower lobe ground-glass opacity no longer visualized. No effusions. Upper Abdomen: No acute findings Musculoskeletal: Chest wall soft tissues are unremarkable. No acute bony abnormality. IMPRESSION: Stable left lower lobe 4 mm nodule. Previously seen left lower lobe ground-glass nodule no longer visualized. Ground-glass area in the right apex felt to reflect scarring. This could be followed with repeat CT in 6-12 months to ensure stability. Electronically Signed   By: Charlett Nose M.D.   On: 09/22/2023 23:14    Assessment and Plan:   [name] is a 48 y.o. y/o female with a history of GERD. Been on a PPI for many years.   Plan  GERD : Counseled on life style changes, suggest to use PPI first thing in the morning on empty stomach and eat 30 minutes after. Advised on the use of a wedge pillow at night , avoid meals for 2 hours prior to bed time.  .Discussed the risks and benefits of long term PPI use including but not limited to bone loss, chronic kidney disease, infections , low magnesium .  Will plan to reduce the dose of Protonix to 20 mg a day I will see her back in 6 months and if there is an option to reduce the dose further or change it to H2 receptor blocker we will aim to do so I advised in the interim if her symptoms return to go back on Protonix 40 mg    Dr Wyline Mood  MD,MRCP [facility]) Follow up in 6 months

## 2023-11-28 ENCOUNTER — Other Ambulatory Visit: Payer: Self-pay | Admitting: Family Medicine

## 2023-11-28 DIAGNOSIS — R918 Other nonspecific abnormal finding of lung field: Secondary | ICD-10-CM

## 2024-01-01 ENCOUNTER — Inpatient Hospital Stay: Attending: Oncology | Admitting: Oncology

## 2024-01-01 ENCOUNTER — Inpatient Hospital Stay

## 2024-01-01 ENCOUNTER — Encounter: Payer: Self-pay | Admitting: Oncology

## 2024-01-01 VITALS — BP 111/75 | HR 66 | Temp 97.3°F | Resp 18

## 2024-01-01 DIAGNOSIS — Z79899 Other long term (current) drug therapy: Secondary | ICD-10-CM | POA: Diagnosis not present

## 2024-01-01 DIAGNOSIS — Z8616 Personal history of COVID-19: Secondary | ICD-10-CM | POA: Insufficient documentation

## 2024-01-01 DIAGNOSIS — Z881 Allergy status to other antibiotic agents status: Secondary | ICD-10-CM | POA: Insufficient documentation

## 2024-01-01 DIAGNOSIS — Z888 Allergy status to other drugs, medicaments and biological substances status: Secondary | ICD-10-CM | POA: Insufficient documentation

## 2024-01-01 DIAGNOSIS — Z90710 Acquired absence of both cervix and uterus: Secondary | ICD-10-CM | POA: Diagnosis not present

## 2024-01-01 DIAGNOSIS — Z9884 Bariatric surgery status: Secondary | ICD-10-CM | POA: Diagnosis not present

## 2024-01-01 DIAGNOSIS — R5383 Other fatigue: Secondary | ICD-10-CM | POA: Diagnosis not present

## 2024-01-01 DIAGNOSIS — F17290 Nicotine dependence, other tobacco product, uncomplicated: Secondary | ICD-10-CM | POA: Insufficient documentation

## 2024-01-01 DIAGNOSIS — E611 Iron deficiency: Secondary | ICD-10-CM | POA: Diagnosis not present

## 2024-01-01 DIAGNOSIS — Z880 Allergy status to penicillin: Secondary | ICD-10-CM | POA: Insufficient documentation

## 2024-01-01 NOTE — Progress Notes (Signed)
 [facility]  Telephone:([phone] Fax:(336) [phone]  ID: [name] OB: Oct 04, 1975  MR#: [id number]  AOZ#:[id number]  Patient Care Team: [name] as PCP - General [name], MD as Consulting Physician (General Surgery) [name], MD as Consulting Physician (Oncology)  CHIEF COMPLAINT: Iron deficiency without anemia.  INTERVAL HISTORY: Patient is a 48 year old female last evaluated in clinic nearly 10 years ago who is referred back for worsening iron deficiency.  Hemoglobin continues to be within normal limits.  Patient has a history of a Roux-en-Y gastric bypass in 2018.  She has fatigue, but otherwise feels well.  She has no neurologic complaints.  She denies any recent fevers or illnesses.  She has a good appetite.  She has no abdominal pain.  She denies any nausea, vomiting, constipation, or diarrhea.  She has no urinary complaints.  Patient offers no specific complaints today.  REVIEW OF SYSTEMS:   Review of Systems  Constitutional:  Positive for malaise/fatigue. Negative for fever and weight loss.  Respiratory: Negative.  Negative for cough, hemoptysis and shortness of breath.   Cardiovascular: Negative.  Negative for chest pain and leg swelling.  Gastrointestinal: Negative.  Negative for abdominal pain.  Genitourinary: Negative.  Negative for dysuria.  Musculoskeletal: Negative.  Negative for back pain.  Skin: Negative.  Negative for rash.  Neurological: Negative.  Negative for dizziness, focal weakness, weakness and headaches.  Psychiatric/Behavioral: Negative.  The patient is not nervous/anxious.     As per HPI. Otherwise, a complete review of systems is negative.  PAST MEDICAL HISTORY: Past Medical History:  Diagnosis Date   Anemia    Low iron   Anxiety    Panic Disorder   Arthritis    back and knees, left elbow   Asthma    long time since asthma attack   Bronchitis    Chronic   Cough    hx of bronchitis    COVID-19 03/2020   GERD (gastroesophageal reflux disease)    Migraines    allergies   Motion sickness    Neuromuscular disorder (HCC)    numbness Right leg   RLS (restless legs syndrome)     PAST SURGICAL HISTORY: Past Surgical History:  Procedure Laterality Date   ABDOMINAL HYSTERECTOMY     2004   ACHILLES TENDON SURGERY Right 05/14/2019   Procedure: ACHILLES REPAIR-SECONDARY;  Surgeon: [name], DPM;  Location: [facility];  Service: Podiatry;  Laterality: Right;  lma experel   BACK SURGERY  07/10/2006   L4-L5    BACK SURGERY  2010   CALCANEAL OSTEOTOMY Right 05/14/2019   Procedure: HAGLUNDS/RETROCALCANEAL;  Surgeon: [name], DPM;  Location: [facility];  Service: Podiatry;  Laterality: Right;   CARPAL TUNNEL RELEASE     right along with trigger finger    COLONOSCOPY WITH PROPOFOL  N/A 10/24/2020   Procedure: COLONOSCOPY WITH PROPOFOL ;  Surgeon: [name], MD;  Location: [facility];  Service: Endoscopy;  Laterality: N/A;   ESOPHAGOGASTRODUODENOSCOPY N/A 10/03/2017   Procedure: ESOPHAGOGASTRODUODENOSCOPY (EGD) WITH POSSIBLE BIOPSY;  Surgeon: [name], MD;  Location: [facility];  Service: General;  Laterality: N/A;   ESOPHAGOGASTRODUODENOSCOPY (EGD) WITH PROPOFOL  N/A 05/17/2016   Procedure: ESOPHAGOGASTRODUODENOSCOPY (EGD) WITH PROPOFOL ;  Surgeon: [name], MD;  Location: [facility];  Service: Endoscopy;  Laterality: N/A;   ESOPHAGOGASTRODUODENOSCOPY (EGD) WITH PROPOFOL  N/A 11/29/2017   Procedure: ESOPHAGOGASTRODUODENOSCOPY (EGD) WITH PROPOFOL ;  Surgeon: [name], MD;  Location: [facility];  Service: Endoscopy;  Laterality: N/A;   ESOPHAGOGASTRODUODENOSCOPY (EGD) WITH PROPOFOL  N/A 10/24/2020   Procedure: ESOPHAGOGASTRODUODENOSCOPY (EGD) WITH BIOPSY;  Surgeon: [name], MD;  Location: [facility];  Service: Endoscopy;  Laterality: N/A;   ETHMOIDECTOMY Bilateral 04/07/2015   Procedure:  TOTAL  ETHMOIDECTOMIES;  Surgeon: [name], MD;  Location: [facility];  Service: ENT;  Laterality: Bilateral;   FRONTAL SINUS EXPLORATION Bilateral 04/07/2015   Procedure: FRONTAL SINUSOTOMIES;  Surgeon: [name], MD;  Location: [facility];  Service: ENT;  Laterality: Bilateral;   IMAGE GUIDED SINUS SURGERY N/A 04/07/2015   Procedure: IMAGE GUIDED SINUS SURGERY;  Surgeon: [name], MD;  Location: [facility];  Service: ENT;  Laterality: N/A;  GAVE DISK TO BRENDA   LAPAROSCOPIC ROUX-EN-Y GASTRIC BYPASS WITH HIATAL HERNIA REPAIR N/A 07/09/2017   Procedure: LAPAROSCOPIC ROUX-EN-Y GASTRIC BYPASS WITH HIATAL HERNIA REPAIR UPPER ENDO;  Surgeon: [name], MD;  Location: [facility];  Service: General;  Laterality: N/A;   LIPECTOMY Bilateral 02/15/2020   THIGHS    MASTOPEXY Bilateral 10/13/2019   Procedure: BILATERAL MASTOPEXY;  Surgeon: [name], MD;  Location: [facility];  Service: Plastics;  Laterality: Bilateral;   MAXILLARY ANTROSTOMY Bilateral 04/07/2015   Procedure: MAXILLARY ANTROSTOMIES;  Surgeon: [name], MD;  Location: [facility];  Service: ENT;  Laterality: Bilateral;   MYELOGRAM     PANNICULECTOMY Bilateral 10/13/2019   Procedure: ABDOMINAL PANNICULECTOMY;  Surgeon: [name], MD;  Location: [facility];  Service: Plastics;  Laterality: Bilateral;   REDUCTION MAMMAPLASTY Bilateral 2022   SEPTOPLASTY N/A 04/07/2015   Procedure: SEPTOPLASTY;  Surgeon: [name], MD;  Location: [facility];  Service: ENT;  Laterality: N/A;   TONSILLECTOMY     ULNAR NERVE TRANSPOSITION     UPPER GI ENDOSCOPY  07/09/2017   Procedure: UPPER GI ENDOSCOPY;  Surgeon: [name], MD;  Location: [facility];  Service: General;;    FAMILY HISTORY: Family History  Problem Relation Age of Onset   Ovarian cancer Sister 45   Breast cancer Neg Hx     ADVANCED DIRECTIVES (Y/N):  N  HEALTH MAINTENANCE: Social  History   Tobacco Use   Smoking status: Former    Current packs/day: 0.00    Average packs/day: 0.3 packs/day for 20.0 years (5.0 ttl pk-yrs)    Types: Cigarettes    Start date: 07/09/1997    Quit date: 07/09/2017    Years since quitting: 6.4   Smokeless tobacco: Never  Vaping Use   Vaping status: Every Day  Substance Use Topics   Alcohol use: No   Drug use: No     Colonoscopy:  PAP:  Bone density:  Lipid panel:  Allergies  Allergen Reactions   Doxycycline  Other (See Comments)   Penicillins Anaphylaxis and Other (See Comments)    Has patient had a PCN reaction causing immediate rash, facial/tongue/throat swelling, SOB or lightheadedness with hypotension: Yes Has patient had a PCN reaction causing severe rash involving mucus membranes or skin necrosis: Yes Has patient had a PCN reaction that required hospitalization: Yes Has patient had a PCN reaction occurring within the last 10 years: No If all of the above answers are "NO", then may proceed with Cephalosporin use.    Prednisone Other (See Comments)    Insomnia,  Unable to sleep    Quinolones Other (See Comments)    BLURRY VISION   Chlorhexidine  Rash    Abdominal rash   Latex Rash and Other (See Comments)  Latex adhesives tape only per pt.  Not allergic to latex gloves or other products, USE PAPER TAPE ONLY PLEASE    Current Outpatient Medications  Medication Sig Dispense Refill   albuterol  (PROVENTIL  HFA;VENTOLIN  HFA) 108 (90 BASE) MCG/ACT inhaler Inhale 2 puffs into the lungs every 6 (six) hours as needed for wheezing or shortness of breath.      Ca Phosphate-Cholecalciferol (CALCIUM/VITAMIN D3 GUMMIES PO) Take 1 each by mouth 3 (three) times daily.     montelukast (SINGULAIR) 10 MG tablet Take 10 mg by mouth daily.     Multiple Vitamins-Minerals (MULTIVITAMIN PO) Take 1 tablet by mouth 2 (two) times daily.     ondansetron  (ZOFRAN -ODT) 4 MG disintegrating tablet Take 4 mg by mouth 3 (three) times daily as needed  for nausea or vomiting.      pantoprazole  (PROTONIX ) 20 MG tablet Take 1 tablet (20 mg total) by mouth daily. 30 tablet 11   ZANAFLEX 6 MG capsule Take by mouth.     clindamycin  (CLEOCIN ) 300 MG capsule Take 1 capsule (300 mg total) by mouth 3 (three) times daily. (Patient not taking: Reported on 01/01/2024) 30 capsule 0   No current facility-administered medications for this visit.    OBJECTIVE: Vitals:   01/01/24 1300  BP: 111/75  Pulse: 66  Resp: 18  Temp: (!) 97.3 F (36.3 C)     There is no height or weight on file to calculate BMI.    ECOG FS:0 - Asymptomatic  General: Well-developed, well-nourished, no acute distress. Eyes: Pink conjunctiva, anicteric sclera. HEENT: Normocephalic, moist mucous membranes. Lungs: No audible wheezing or coughing. Heart: Regular rate and rhythm. Abdomen: Soft, nontender, no obvious distention. Musculoskeletal: No edema, cyanosis, or clubbing. Neuro: Alert, answering all questions appropriately. Cranial nerves grossly intact. Skin: No rashes or petechiae noted. Psych: Normal affect. Lymphatics: No cervical, calvicular, axillary or inguinal LAD.   LAB RESULTS:  Lab Results  Component Value Date   NA 140 02/11/2020   K 4.2 02/11/2020   CL 103 02/11/2020   CO2 28 02/11/2020   GLUCOSE 100 (H) 02/11/2020   BUN 9 02/11/2020   CREATININE 0.55 02/11/2020   CALCIUM 9.7 02/11/2020   PROT 6.5 08/01/2018   ALBUMIN 4.4 08/01/2018   AST 16 08/01/2018   ALT 21 08/01/2018   ALKPHOS 83 08/01/2018   BILITOT 0.4 08/01/2018   GFRNONAA >60 02/11/2020   GFRAA >60 02/11/2020    Lab Results  Component Value Date   WBC 12.4 (H) 02/11/2020   NEUTROABS 6.6 10/09/2019   HGB 13.9 02/11/2020   HCT 43.1 02/11/2020   MCV 93.9 02/11/2020   PLT 310 02/11/2020     STUDIES: No results found.  ASSESSMENT: Iron deficiency without anemia.  PLAN:    Iron deficiency without anemia: Likely secondary to poor absorption from Roux-en-Y gastric bypass.   Patient's most recent hemoglobin was within normal limits, but she has significantly reduced iron stores.  Proceed with 3 doses of 200 mg IV Venofer.  No further interventions are needed.  Return to clinic in 3 months with repeat laboratory work, further evaluation, and consideration of additional treatment if needed.  I spent a total of 45 minutes reviewing chart data, face-to-face evaluation with the patient, counseling and coordination of care as detailed above.   Patient expressed understanding and was in agreement with this plan. She also understands that She can call clinic at any time with any questions, concerns, or complaints.    [name], MD  01/01/2024 2:51 PM

## 2024-01-02 ENCOUNTER — Inpatient Hospital Stay

## 2024-01-02 VITALS — BP 92/69 | HR 65 | Temp 96.5°F

## 2024-01-02 DIAGNOSIS — E611 Iron deficiency: Secondary | ICD-10-CM | POA: Diagnosis not present

## 2024-01-02 MED ORDER — IRON SUCROSE 20 MG/ML IV SOLN
200.0000 mg | Freq: Once | INTRAVENOUS | Status: AC
  Administered 2024-01-02: 200 mg via INTRAVENOUS
  Filled 2024-01-02: qty 10

## 2024-01-02 MED ORDER — SODIUM CHLORIDE 0.9% FLUSH
10.0000 mL | Freq: Once | INTRAVENOUS | Status: AC | PRN
  Administered 2024-01-02: 10 mL
  Filled 2024-01-02: qty 10

## 2024-01-02 NOTE — Progress Notes (Signed)
Patient tolerated Venofer infusion well, no questions/concerns voiced. Monitored 30 min post transfusion. Patient stable at discharge. VSS. AVS given.

## 2024-01-02 NOTE — Patient Instructions (Signed)

## 2024-01-09 ENCOUNTER — Inpatient Hospital Stay

## 2024-01-09 VITALS — BP 104/74 | HR 68 | Resp 18

## 2024-01-09 DIAGNOSIS — E611 Iron deficiency: Secondary | ICD-10-CM

## 2024-01-09 MED ORDER — IRON SUCROSE 20 MG/ML IV SOLN
200.0000 mg | Freq: Once | INTRAVENOUS | Status: AC
  Administered 2024-01-09: 200 mg via INTRAVENOUS
  Filled 2024-01-09: qty 10

## 2024-01-09 NOTE — Patient Instructions (Signed)

## 2024-01-15 ENCOUNTER — Other Ambulatory Visit: Payer: Self-pay | Admitting: Family Medicine

## 2024-01-15 DIAGNOSIS — Z1231 Encounter for screening mammogram for malignant neoplasm of breast: Secondary | ICD-10-CM

## 2024-01-16 ENCOUNTER — Inpatient Hospital Stay

## 2024-01-16 VITALS — BP 99/66 | HR 72 | Resp 16

## 2024-01-16 DIAGNOSIS — E611 Iron deficiency: Secondary | ICD-10-CM | POA: Diagnosis not present

## 2024-01-16 MED ORDER — IRON SUCROSE 20 MG/ML IV SOLN
200.0000 mg | Freq: Once | INTRAVENOUS | Status: AC
  Administered 2024-01-16: 200 mg via INTRAVENOUS
  Filled 2024-01-16: qty 10

## 2024-02-10 ENCOUNTER — Ambulatory Visit: Admitting: Skilled Nursing Facility1

## 2024-03-04 ENCOUNTER — Ambulatory Visit: Admitting: Skilled Nursing Facility1

## 2024-03-11 ENCOUNTER — Ambulatory Visit
Admission: RE | Admit: 2024-03-11 | Discharge: 2024-03-11 | Disposition: A | Discharge status: Home / Self Care | Source: Ambulatory Visit | Attending: Family Medicine | Admitting: Family Medicine

## 2024-03-11 DIAGNOSIS — R918 Other nonspecific abnormal finding of lung field: Secondary | ICD-10-CM | POA: Diagnosis present

## 2024-03-31 ENCOUNTER — Other Ambulatory Visit

## 2024-04-02 ENCOUNTER — Ambulatory Visit

## 2024-04-02 ENCOUNTER — Ambulatory Visit: Admitting: Oncology

## 2024-04-06 ENCOUNTER — Ambulatory Visit: Admitting: Skilled Nursing Facility1

## 2024-04-20 ENCOUNTER — Other Ambulatory Visit: Payer: Self-pay | Admitting: Oncology

## 2024-04-20 ENCOUNTER — Inpatient Hospital Stay: Attending: Oncology

## 2024-04-20 DIAGNOSIS — Z79899 Other long term (current) drug therapy: Secondary | ICD-10-CM | POA: Insufficient documentation

## 2024-04-20 DIAGNOSIS — E611 Iron deficiency: Secondary | ICD-10-CM | POA: Diagnosis not present

## 2024-04-20 DIAGNOSIS — E538 Deficiency of other specified B group vitamins: Secondary | ICD-10-CM | POA: Insufficient documentation

## 2024-04-20 DIAGNOSIS — F17290 Nicotine dependence, other tobacco product, uncomplicated: Secondary | ICD-10-CM | POA: Insufficient documentation

## 2024-04-20 DIAGNOSIS — Z9884 Bariatric surgery status: Secondary | ICD-10-CM | POA: Diagnosis not present

## 2024-04-20 LAB — VITAMIN B12: Vitamin B-12: 151 pg/mL — ABNORMAL LOW (ref 180–914)

## 2024-04-20 LAB — CBC WITH DIFFERENTIAL (CANCER CENTER ONLY)
Abs Immature Granulocytes: 0.03 K/uL (ref 0.00–0.07)
Basophils Absolute: 0.1 K/uL (ref 0.0–0.1)
Basophils Relative: 1 %
Eosinophils Absolute: 0.3 K/uL (ref 0.0–0.5)
Eosinophils Relative: 3 %
HCT: 45.5 % (ref 36.0–46.0)
Hemoglobin: 14.8 g/dL (ref 12.0–15.0)
Immature Granulocytes: 0 %
Lymphocytes Relative: 19 %
Lymphs Abs: 2.1 K/uL (ref 0.7–4.0)
MCH: 31.2 pg (ref 26.0–34.0)
MCHC: 32.5 g/dL (ref 30.0–36.0)
MCV: 95.8 fL (ref 80.0–100.0)
Monocytes Absolute: 0.5 K/uL (ref 0.1–1.0)
Monocytes Relative: 5 %
Neutro Abs: 7.8 K/uL — ABNORMAL HIGH (ref 1.7–7.7)
Neutrophils Relative %: 72 %
Platelet Count: 292 K/uL (ref 150–400)
RBC: 4.75 MIL/uL (ref 3.87–5.11)
RDW: 13.2 % (ref 11.5–15.5)
WBC Count: 10.8 K/uL — ABNORMAL HIGH (ref 4.0–10.5)
nRBC: 0 % (ref 0.0–0.2)

## 2024-04-20 LAB — FERRITIN: Ferritin: 48 ng/mL (ref 11–307)

## 2024-04-20 LAB — IRON AND TIBC
Iron: 125 ug/dL (ref 28–170)
Saturation Ratios: 34 % — ABNORMAL HIGH (ref 10.4–31.8)
TIBC: 371 ug/dL (ref 250–450)
UIBC: 246 ug/dL

## 2024-04-20 LAB — FOLATE: Folate: 10.1 ng/mL (ref >5.9)

## 2024-04-21 ENCOUNTER — Inpatient Hospital Stay

## 2024-04-21 ENCOUNTER — Encounter: Payer: Self-pay | Admitting: Oncology

## 2024-04-21 ENCOUNTER — Inpatient Hospital Stay (HOSPITAL_BASED_OUTPATIENT_CLINIC_OR_DEPARTMENT_OTHER): Admitting: Oncology

## 2024-04-21 VITALS — BP 112/74 | HR 73 | Temp 98.9°F | Resp 18 | Wt 129.1 lb

## 2024-04-21 DIAGNOSIS — E538 Deficiency of other specified B group vitamins: Secondary | ICD-10-CM | POA: Diagnosis not present

## 2024-04-21 DIAGNOSIS — E611 Iron deficiency: Secondary | ICD-10-CM

## 2024-04-21 MED ORDER — CYANOCOBALAMIN 1000 MCG/ML IJ SOLN
1000.0000 ug | Freq: Once | INTRAMUSCULAR | Status: AC
  Administered 2024-04-21: 1000 ug via INTRAMUSCULAR
  Filled 2024-04-21: qty 1

## 2024-04-21 NOTE — Progress Notes (Signed)
 [facility]  [phone] Fax:(336) [phone]  ID: [name] OB: 30-Jun-1976  MR#: [id number]  RDW#:[id number]  Patient Care Team: La[facility]ails [facility] as PCP - General Tanda Locus, MD as Consulting Physician (General Surgery) Jacobo Evalene PARAS, MD as Consulting Physician (Oncology)  CHIEF COMPLAINT: Iron  deficiency without anemia.  INTERVAL HISTORY: Patient returns to clinic today for repeat laboratory, further evaluation, consideration of additional IV Venofer  and B12 injection.  He currently feels well and is asymptomatic.  She does not complain of weakness or fatigue today.  She has no neurologic complaints.  She denies any recent fevers or illnesses.  She has a good appetite.  She has no abdominal pain.  She denies any nausea, vomiting, constipation, or diarrhea.  She has no urinary complaints.  Patient offers no specific complaints today.  REVIEW OF SYSTEMS:   Review of Systems  Constitutional: Negative.  Negative for fever, malaise/fatigue and weight loss.  Respiratory: Negative.  Negative for cough, hemoptysis and shortness of breath.   Cardiovascular: Negative.  Negative for chest pain and leg swelling.  Gastrointestinal: Negative.  Negative for abdominal pain.  Genitourinary: Negative.  Negative for dysuria.  Musculoskeletal: Negative.  Negative for back pain.  Skin: Negative.  Negative for rash.  Neurological: Negative.  Negative for dizziness, focal weakness, weakness and headaches.  Psychiatric/Behavioral: Negative.  The patient is not nervous/anxious.     As per HPI. Otherwise, a complete review of systems is negative.  PAST MEDICAL HISTORY: Past Medical History:  Diagnosis Date   Anemia    Low iron    Anxiety    Panic Disorder   Arthritis    back and knees, left elbow   Asthma    long time since asthma attack   Bronchitis    Chronic   Cough    hx of bronchitis   COVID-19 03/2020   GERD (gastroesophageal reflux  disease)    Migraines    allergies   Motion sickness    Neuromuscular disorder (HCC)    numbness Right leg   RLS (restless legs syndrome)     PAST SURGICAL HISTORY: Past Surgical History:  Procedure Laterality Date   ABDOMINAL HYSTERECTOMY     2004   ACHILLES TENDON SURGERY Right 05/14/2019   Procedure: ACHILLES REPAIR-SECONDARY;  Surgeon: Lilli Cough, DPM;  Location: [facility];  Service: Podiatry;  Laterality: Right;  lma experel   BACK SURGERY  07/10/2006   L4-L5    BACK SURGERY  2010   CALCANEAL OSTEOTOMY Right 05/14/2019   Procedure: HAGLUNDS/RETROCALCANEAL;  Surgeon: Lilli Cough, DPM;  Location: [facility];  Service: Podiatry;  Laterality: Right;   CARPAL TUNNEL RELEASE     right along with trigger finger    COLONOSCOPY WITH PROPOFOL  N/A 10/24/2020   Procedure: COLONOSCOPY WITH PROPOFOL ;  Surgeon: Jinny Carmine, MD;  Location: [facility];  Service: Endoscopy;  Laterality: N/A;   ESOPHAGOGASTRODUODENOSCOPY N/A 10/03/2017   Procedure: ESOPHAGOGASTRODUODENOSCOPY (EGD) WITH POSSIBLE BIOPSY;  Surgeon: Tanda Locus, MD;  Location: [facility];  Service: General;  Laterality: N/A;   ESOPHAGOGASTRODUODENOSCOPY (EGD) WITH PROPOFOL  N/A 05/17/2016   Procedure: ESOPHAGOGASTRODUODENOSCOPY (EGD) WITH PROPOFOL ;  Surgeon: Carmine Jinny, MD;  Location: [facility];  Service: Endoscopy;  Laterality: N/A;   ESOPHAGOGASTRODUODENOSCOPY (EGD) WITH PROPOFOL  N/A 11/29/2017   Procedure: ESOPHAGOGASTRODUODENOSCOPY (EGD) WITH PROPOFOL ;  Surgeon: Jinny Carmine, MD;  Location: [facility];  Service: Endoscopy;  Laterality: N/A;   ESOPHAGOGASTRODUODENOSCOPY (EGD) WITH PROPOFOL  N/A 10/24/2020   Procedure: ESOPHAGOGASTRODUODENOSCOPY (  EGD) WITH BIOPSY;  Surgeon: Jinny Carmine, MD;  Location: [facility];  Service: Endoscopy;  Laterality: N/A;   ETHMOIDECTOMY Bilateral 04/07/2015   Procedure:  TOTAL ETHMOIDECTOMIES;  Surgeon: Deward Argue, MD;  Location:  [facility];  Service: ENT;  Laterality: Bilateral;   FRONTAL SINUS EXPLORATION Bilateral 04/07/2015   Procedure: FRONTAL SINUSOTOMIES;  Surgeon: Deward Argue, MD;  Location: [facility];  Service: ENT;  Laterality: Bilateral;   IMAGE GUIDED SINUS SURGERY N/A 04/07/2015   Procedure: IMAGE GUIDED SINUS SURGERY;  Surgeon: Deward Argue, MD;  Location: [facility];  Service: ENT;  Laterality: N/A;  GAVE DISK TO BRENDA   LAPAROSCOPIC ROUX-EN-Y GASTRIC BYPASS WITH HIATAL HERNIA REPAIR N/A 07/09/2017   Procedure: LAPAROSCOPIC ROUX-EN-Y GASTRIC BYPASS WITH HIATAL HERNIA REPAIR UPPER ENDO;  Surgeon: Tanda Locus, MD;  Location: [facility];  Service: General;  Laterality: N/A;   LIPECTOMY Bilateral 02/15/2020   THIGHS    MASTOPEXY Bilateral 10/13/2019   Procedure: BILATERAL MASTOPEXY;  Surgeon: Arelia Filippo, MD;  Location: [facility];  Service: Plastics;  Laterality: Bilateral;   MAXILLARY ANTROSTOMY Bilateral 04/07/2015   Procedure: MAXILLARY ANTROSTOMIES;  Surgeon: Deward Argue, MD;  Location: [facility];  Service: ENT;  Laterality: Bilateral;   MYELOGRAM     PANNICULECTOMY Bilateral 10/13/2019   Procedure: ABDOMINAL PANNICULECTOMY;  Surgeon: Arelia Filippo, MD;  Location: [facility];  Service: Plastics;  Laterality: Bilateral;   REDUCTION MAMMAPLASTY Bilateral 2022   SEPTOPLASTY N/A 04/07/2015   Procedure: SEPTOPLASTY;  Surgeon: Deward Argue, MD;  Location: [facility];  Service: ENT;  Laterality: N/A;   TONSILLECTOMY     ULNAR NERVE TRANSPOSITION     UPPER GI ENDOSCOPY  07/09/2017   Procedure: UPPER GI ENDOSCOPY;  Surgeon: Tanda Locus, MD;  Location: [facility];  Service: General;;    FAMILY HISTORY: Family History  Problem Relation Age of Onset   Ovarian cancer Sister 4   Breast cancer Neg Hx     ADVANCED DIRECTIVES (Y/N):  N  HEALTH MAINTENANCE: Social History   Tobacco Use   Smoking status: Former    Current  packs/day: 0.00    Average packs/day: 0.3 packs/day for 20.0 years (5.0 ttl pk-yrs)    Types: Cigarettes    Start date: 07/09/1997    Quit date: 07/09/2017    Years since quitting: 6.7   Smokeless tobacco: Never  Vaping Use   Vaping status: Every Day  Substance Use Topics   Alcohol use: No   Drug use: No     Colonoscopy:  PAP:  Bone density:  Lipid panel:  Allergies  Allergen Reactions   Doxycycline  Other (See Comments)   Penicillins Anaphylaxis and Other (See Comments)    Has patient had a PCN reaction causing immediate rash, facial/tongue/throat swelling, SOB or lightheadedness with hypotension: Yes Has patient had a PCN reaction causing severe rash involving mucus membranes or skin necrosis: Yes Has patient had a PCN reaction that required hospitalization: Yes Has patient had a PCN reaction occurring within the last 10 years: No If all of the above answers are NO, then may proceed with Cephalosporin use.    Prednisone Other (See Comments)    Insomnia,  Unable to sleep    Quinolones Other (See Comments)    BLURRY VISION   Chlorhexidine  Rash    Abdominal rash   Latex Rash and Other (See Comments)    Latex adhesives tape only per pt.  Not allergic to latex gloves or other  products, USE PAPER TAPE ONLY PLEASE    Current Outpatient Medications  Medication Sig Dispense Refill   albuterol  (PROVENTIL  HFA;VENTOLIN  HFA) 108 (90 BASE) MCG/ACT inhaler Inhale 2 puffs into the lungs every 6 (six) hours as needed for wheezing or shortness of breath.      Ca Phosphate-Cholecalciferol (CALCIUM/VITAMIN D3 GUMMIES PO) Take 1 each by mouth 3 (three) times daily.     montelukast (SINGULAIR) 10 MG tablet Take 10 mg by mouth daily.     ondansetron  (ZOFRAN -ODT) 4 MG disintegrating tablet Take 4 mg by mouth 3 (three) times daily as needed for nausea or vomiting.      pantoprazole  (PROTONIX ) 20 MG tablet Take 1 tablet (20 mg total) by mouth daily. 30 tablet 11   ZANAFLEX 6 MG capsule Take by  mouth.     clindamycin  (CLEOCIN ) 300 MG capsule Take 1 capsule (300 mg total) by mouth 3 (three) times daily. (Patient not taking: Reported on 04/21/2024) 30 capsule 0   Multiple Vitamins-Minerals (MULTIVITAMIN PO) Take 1 tablet by mouth 2 (two) times daily. (Patient not taking: Reported on 04/21/2024)     No current facility-administered medications for this visit.    OBJECTIVE: Vitals:   04/21/24 1041  BP: 112/74  Pulse: 73  Resp: 18  Temp: 98.9 F (37.2 C)     Body mass index is 20.22 kg/m.    ECOG FS:0 - Asymptomatic  General: Well-developed, well-nourished, no acute distress. Eyes: Pink conjunctiva, anicteric sclera. HEENT: Normocephalic, moist mucous membranes. Lungs: No audible wheezing or coughing. Heart: Regular rate and rhythm. Abdomen: Soft, nontender, no obvious distention. Musculoskeletal: No edema, cyanosis, or clubbing. Neuro: Alert, answering all questions appropriately. Cranial nerves grossly intact. Skin: No rashes or petechiae noted. Psych: Normal affect.  LAB RESULTS:  Lab Results  Component Value Date   NA 140 02/11/2020   K 4.2 02/11/2020   CL 103 02/11/2020   CO2 28 02/11/2020   GLUCOSE 100 (H) 02/11/2020   BUN 9 02/11/2020   CREATININE 0.55 02/11/2020   CALCIUM 9.7 02/11/2020   PROT 6.5 08/01/2018   ALBUMIN 4.4 08/01/2018   AST 16 08/01/2018   ALT 21 08/01/2018   ALKPHOS 83 08/01/2018   BILITOT 0.4 08/01/2018   GFRNONAA >60 02/11/2020   GFRAA >60 02/11/2020    Lab Results  Component Value Date   WBC 10.8 (H) 04/20/2024   NEUTROABS 7.8 (H) 04/20/2024   HGB 14.8 04/20/2024   HCT 45.5 04/20/2024   MCV 95.8 04/20/2024   PLT 292 04/20/2024   Lab Results  Component Value Date   IRON  125 04/20/2024   TIBC 371 04/20/2024   IRONPCTSAT 34 (H) 04/20/2024   Lab Results  Component Value Date   FERRITIN 48 04/20/2024     STUDIES: No results found.  ASSESSMENT: Iron  deficiency without anemia.  PLAN:    Iron  deficiency without  anemia: Resolved.  Likely secondary to poor absorption from Roux-en-Y gastric bypass.  Patient's hemoglobin and iron  stores are now within normal limits.  She does not require additional IV Venofer  today.  Patient last received treatment on January 16, 2024.  No intervention is needed.  Return to clinic in 4 months with repeat laboratory work, further evaluation, and continuation of treatment if needed.   B12 deficiency: Patient noted to have significantly reduced B12 levels.  Proceed with 1000 mcg IM injection today.  Return to clinic in 1, 2, and 3 months for injections only.  Patient will then return to clinic in 4 months  as above.  I spent a total of 30 minutes reviewing chart data, face-to-face evaluation with the patient, counseling and coordination of care as detailed above.   Patient expressed understanding and was in agreement with this plan. She also understands that She can call clinic at any time with any questions, concerns, or complaints.    Evalene JINNY Reusing, MD   04/21/2024 10:58 AM

## 2024-04-21 NOTE — Progress Notes (Signed)
 Discuss lab results.

## 2024-05-06 ENCOUNTER — Encounter: Payer: Self-pay | Admitting: Skilled Nursing Facility1

## 2024-05-06 ENCOUNTER — Encounter: Attending: General Surgery | Admitting: Skilled Nursing Facility1

## 2024-05-06 DIAGNOSIS — E669 Obesity, unspecified: Secondary | ICD-10-CM | POA: Insufficient documentation

## 2024-05-06 NOTE — Progress Notes (Signed)
 Bariatric Nutrition Follow-Up Visit Medical Nutrition Therapy    NUTRITION ASSESSMENT    Surgery date: 07/09/2017 Surgery type: RYGB Start weight at [facility]: 249.2 Weight today: 126.5 pounds     Body Composition Scale 12/8/ 2020 12/18/2021 05/06/2024  Total Body Fat % 23.9 16.6 19.8  Visceral Fat 4 3 3   Fat-Free Mass % 76 83.3 80.1   Total Body Water  % 52.5 56.1 54.5   Muscle-Mass lbs 32.3 31.9 31.8  Body Fat Displacement              Torso  lbs 20.5 12.2 15.4         Left Leg  lbs 4.1 2.4 3.0         Right Leg  lbs 4.1 2.4 3.0         Left Arm  lbs 2.0 1.2 1.5         Right Arm   lbs   1.2 1.5    Clinical  Medical hx: GERD, IBS, Barrett's esophagus, DM Medications: protonix , zanaflex, albuterol , singulair  Labs: B12 151 (has a few B12 shots upcoming), WBC 10.8, A1C 5.7   Lifestyle & Dietary Hx     Currently  Upon visual inspection Presents with: temple wasting, hallow cheeks, clavicle wasting, and protruding spine   Pt states she uses full fat dressing. Pt states she cannot eat hamburger and pasta its hit or miss.  Pt states she takes antinausea medications.  Pt states she eats every 1.5-2 hours.  Pt states her doctor told her not to take the vitamins because she is not absorbing them anyway.  Pt does not report any signs or symptoms and states she is not tired.    Estimated daily fluid intake: 160+ oz Estimated daily protein intake: 120 g Supplements: calcium chews, flinstones Current average weekly physical activity: walking dog 4 2 hours    24-Hr Dietary Recall: candy sweets all day First Meal 8am: nurri protein shake Snack: sometimes popcorn or carrots or chips Second Meal 12:30-1pm: half sandwich salmi + [place] + ham + cheese + roast beef + mustard and a little veggie salad Snack: sometimes a piece of hershey  Snack: granola bar Third Meal: 6 in subway sub or chicken + veggies + potatoes + sour cream or steak + potato + green bean Snack: 2 hours later 6 in  sub Beverages: coffee, water , unsweet tea, protein drink     Using straws: no Drinking while eating: no Having you been chewing well: yes Chewing/swallowing difficulties: no Changes in vision: no Changes to mood/headaches: yes Hair loss/Changes to skin/Changes to nails: getting thicker, no, no Any difficulty focusing or concentrating: no Sweating: no Dizziness/Lightheaded: yes Palpitations: no  Carbonated beverages: no N/V/D/C/GAS: nausea  Abdominal Pain: no Dumping syndrome: no     NUTRITION DIAGNOSIS  Overweight/obesity ([employer]-3.3) related to past poor dietary habits and physical inactivity as evidenced by completed bariatric surgery and following dietary guidelines for continued weight loss and healthy nutrition status.     NUTRITION INTERVENTION: Continued Nutrition counseling (C-1) and education (E-2) to facilitate bariatric surgery goals, including: The importance of consuming adequate calories as well as certain nutrients daily due to the body's need for essential vitamins, minerals, and fats The importance of daily physical activity and to reach a goal of at least 150 minutes of moderate to vigorous physical activity weekly (or as directed by their physician) due to benefits such as increased musculature and improved lab values The importance of intuitive eating specifically learning hunger-satiety  cues and understanding the importance of learning a new body: The importance of mindful eating to avoid grazing behaviors  What happens when I don't eat?  Your body uses food as fuel to keep all the important organs and cells in your body running well. When you don't eat, your body doesn't get the fuel it needs and your organs and body parts can suffer.  The Heart & Circulation: Your heart is a muscle that can shrink and weaken when you don't eat. This can create circulation problems and an irregular heartbeat. Blood pressure can get very low during starvation and make you feel dizzy  when you stand up.  The Stomach: Your stomach becomes smaller when you don't eat so when you start eating again, your stomach is likely to feel uncomfortable (you may have stomach aches and/or gas). Also, your stomach will not empty as fast making you feel full longer.  The Intestines: Your intestines will move food slowly often resulting in constipation (trouble having a bowel movement) and/or stomach aches or cramps when you eat meals.  The Brain: Your brain, which controls the rest of your body's functions, does not work properly without food. For example, you may have trouble thinking clearly or paying attention and/or you could also feel anxious or sad.  Body Cells: The balance of electrolytes in the blood can be changed with malnutrition or with purging. Without food, the amount of potassium and phosphorous can get dangerously low which can cause problems with your muscles, changes in your brain functioning, and cause life-threatening heart and rhythm problems.  Bones: When you don't eat, your bones often become weak due to low calcium and low hormone levels, which increases your risk of getting broken bones now and developing weak bones when you're older.  Body Temperature: Your body naturally lowers its temperature in times of starvation to conserve energy and protect vital organs. When this happens, there is a decrease in circulation (blood flow) to your fingers and toes which will often cause your hands and feet to feel cold and look bluish.  Skin: Your skin becomes dry when your body is not well hydrated and when it does not get enough vitamins and minerals from food. The skin will naturally protect your body during periods of starvation by developing fine, soft hair called "lanugo" that covers the skin to keep your body warm.  Hair: When your hair doesn't get enough nourishment from the vitamins and minerals that are naturally found in healthy food, it becomes dry, thin and it can even fall out.   Nails: Your nails require nutrients in the form of vitamins and minerals from your diet. When you don't eat, you deny your body what it needs and your nails become dry and brittle and break easily.  Teeth: Your teeth need vitamin D  and calcium from food sources. Without both of these minerals, you can end up with dental problems such as tooth decay and gum disease. Purging can also destroy tooth enamel.   Learning Style & Readiness for Change Teaching method utilized: Visual & Auditory  Demonstrated degree of understanding via: Teach Back  Readiness Level: contemplative Barriers to learning/adherence to lifestyle change: fear of weight gain   RD's Notes for Next Visit Assess adherence to pt chosen goals   Goals: At least eat 2 spoons of peanut butter with your protein shake and more often throughout the day Increase your overall fat consumption      MONITORING & EVALUATION Dietary intake, weekly physical  activity, body weight   Next Steps Patient is to follow-up in 3 months

## 2024-05-21 ENCOUNTER — Inpatient Hospital Stay: Attending: Oncology

## 2024-05-21 DIAGNOSIS — Z79899 Other long term (current) drug therapy: Secondary | ICD-10-CM | POA: Diagnosis not present

## 2024-05-21 DIAGNOSIS — E538 Deficiency of other specified B group vitamins: Secondary | ICD-10-CM | POA: Diagnosis present

## 2024-05-21 DIAGNOSIS — E611 Iron deficiency: Secondary | ICD-10-CM

## 2024-05-21 MED ORDER — CYANOCOBALAMIN 1000 MCG/ML IJ SOLN
1000.0000 ug | Freq: Once | INTRAMUSCULAR | Status: AC
  Administered 2024-05-21: 1000 ug via INTRAMUSCULAR
  Filled 2024-05-21: qty 1

## 2024-06-22 ENCOUNTER — Inpatient Hospital Stay: Attending: Oncology

## 2024-06-22 DIAGNOSIS — E611 Iron deficiency: Secondary | ICD-10-CM

## 2024-06-22 DIAGNOSIS — E538 Deficiency of other specified B group vitamins: Secondary | ICD-10-CM | POA: Insufficient documentation

## 2024-06-22 DIAGNOSIS — Z79899 Other long term (current) drug therapy: Secondary | ICD-10-CM | POA: Insufficient documentation

## 2024-06-22 MED ORDER — CYANOCOBALAMIN 1000 MCG/ML IJ SOLN
1000.0000 ug | Freq: Once | INTRAMUSCULAR | Status: AC
  Administered 2024-06-22: 1000 ug via INTRAMUSCULAR
  Filled 2024-06-22: qty 1

## 2024-07-21 ENCOUNTER — Inpatient Hospital Stay: Attending: Oncology

## 2024-07-21 DIAGNOSIS — E538 Deficiency of other specified B group vitamins: Secondary | ICD-10-CM | POA: Diagnosis present

## 2024-07-21 DIAGNOSIS — E611 Iron deficiency: Secondary | ICD-10-CM

## 2024-07-21 DIAGNOSIS — Z79899 Other long term (current) drug therapy: Secondary | ICD-10-CM | POA: Diagnosis not present

## 2024-07-21 MED ORDER — CYANOCOBALAMIN 1000 MCG/ML IJ SOLN
1000.0000 ug | Freq: Once | INTRAMUSCULAR | Status: AC
  Administered 2024-07-21: 1000 ug via INTRAMUSCULAR
  Filled 2024-07-21: qty 1

## 2024-08-19 ENCOUNTER — Telehealth: Payer: Self-pay | Admitting: Oncology

## 2024-08-19 NOTE — Telephone Encounter (Signed)
 Pt called to r/s appts - pt father passed away so pt is [place] til next week - [facility]

## 2024-08-21 ENCOUNTER — Inpatient Hospital Stay

## 2024-08-24 ENCOUNTER — Ambulatory Visit: Admitting: Oncology

## 2024-08-24 ENCOUNTER — Ambulatory Visit

## 2024-08-28 ENCOUNTER — Inpatient Hospital Stay

## 2024-08-28 ENCOUNTER — Telehealth: Payer: Self-pay | Admitting: Oncology

## 2024-08-28 NOTE — Telephone Encounter (Signed)
 Pt called to r/s appts from today and 2/2 due to being sick. New dates and times confirmed with pt

## 2024-08-31 ENCOUNTER — Inpatient Hospital Stay

## 2024-08-31 ENCOUNTER — Inpatient Hospital Stay: Admitting: Oncology

## 2024-09-07 ENCOUNTER — Ambulatory Visit: Admitting: Skilled Nursing Facility1

## 2024-09-08 ENCOUNTER — Inpatient Hospital Stay: Attending: Oncology

## 2024-09-09 ENCOUNTER — Inpatient Hospital Stay: Admitting: Oncology

## 2024-09-09 ENCOUNTER — Inpatient Hospital Stay
